# Patient Record
Sex: Female | Born: 1945
Health system: Southern US, Community
[De-identification: ages and names within clinical notes are randomized; demographics above are authoritative.]

## PROBLEM LIST (undated history)

## (undated) DIAGNOSIS — R002 Palpitations: Secondary | ICD-10-CM

## (undated) DIAGNOSIS — M8580 Other specified disorders of bone density and structure, unspecified site: Secondary | ICD-10-CM

## (undated) DIAGNOSIS — E119 Type 2 diabetes mellitus without complications: Secondary | ICD-10-CM

## (undated) DIAGNOSIS — Z8489 Family history of other specified conditions: Secondary | ICD-10-CM

## (undated) DIAGNOSIS — Z973 Presence of spectacles and contact lenses: Secondary | ICD-10-CM

## (undated) DIAGNOSIS — N8110 Cystocele, unspecified: Secondary | ICD-10-CM

## (undated) DIAGNOSIS — E785 Hyperlipidemia, unspecified: Secondary | ICD-10-CM

## (undated) DIAGNOSIS — I10 Essential (primary) hypertension: Secondary | ICD-10-CM

## (undated) DIAGNOSIS — I493 Ventricular premature depolarization: Secondary | ICD-10-CM

## (undated) HISTORY — DX: Palpitations: R00.2

## (undated) HISTORY — DX: Cystocele, unspecified: N81.10

## (undated) HISTORY — DX: Other specified disorders of bone density and structure, unspecified site: M85.80

## (undated) HISTORY — DX: Type 2 diabetes mellitus without complications: E11.9

## (undated) HISTORY — DX: Ventricular premature depolarization: I49.3

## (undated) HISTORY — DX: Essential (primary) hypertension: I10

## (undated) HISTORY — DX: Hyperlipidemia, unspecified: E78.5

---

## 1975-11-18 HISTORY — PX: TOTAL VAGINAL HYSTERECTOMY: SHX2548

## 1987-11-18 HISTORY — PX: BREAST EXCISIONAL BIOPSY: SUR124

## 1998-11-17 HISTORY — PX: LUMBAR SPINE SURGERY: SHX701

## 1999-06-11 ENCOUNTER — Other Ambulatory Visit: Admission: RE | Admit: 1999-06-11 | Discharge: 1999-06-11 | Payer: Self-pay | Admitting: Obstetrics and Gynecology

## 2000-01-17 ENCOUNTER — Encounter: Admission: RE | Admit: 2000-01-17 | Discharge: 2000-01-17 | Payer: Self-pay | Admitting: Obstetrics and Gynecology

## 2000-01-17 ENCOUNTER — Encounter: Payer: Self-pay | Admitting: Obstetrics and Gynecology

## 2000-01-30 ENCOUNTER — Emergency Department (HOSPITAL_COMMUNITY): Admission: EM | Admit: 2000-01-30 | Discharge: 2000-01-30 | Payer: Self-pay | Admitting: Emergency Medicine

## 2000-01-30 ENCOUNTER — Encounter: Payer: Self-pay | Admitting: Emergency Medicine

## 2000-06-11 ENCOUNTER — Other Ambulatory Visit: Admission: RE | Admit: 2000-06-11 | Discharge: 2000-06-11 | Payer: Self-pay | Admitting: Obstetrics and Gynecology

## 2001-05-26 ENCOUNTER — Encounter: Admission: RE | Admit: 2001-05-26 | Discharge: 2001-05-26 | Payer: Self-pay | Admitting: Obstetrics and Gynecology

## 2001-05-26 ENCOUNTER — Encounter: Payer: Self-pay | Admitting: Obstetrics and Gynecology

## 2001-06-04 ENCOUNTER — Other Ambulatory Visit: Admission: RE | Admit: 2001-06-04 | Discharge: 2001-06-04 | Payer: Self-pay | Admitting: Obstetrics and Gynecology

## 2001-07-27 ENCOUNTER — Ambulatory Visit (HOSPITAL_COMMUNITY): Admission: RE | Admit: 2001-07-27 | Discharge: 2001-07-27 | Payer: Self-pay | Admitting: Gastroenterology

## 2002-06-01 ENCOUNTER — Encounter: Payer: Self-pay | Admitting: Obstetrics and Gynecology

## 2002-06-01 ENCOUNTER — Encounter: Admission: RE | Admit: 2002-06-01 | Discharge: 2002-06-01 | Payer: Self-pay | Admitting: Obstetrics and Gynecology

## 2002-06-07 ENCOUNTER — Encounter: Admission: RE | Admit: 2002-06-07 | Discharge: 2002-06-07 | Payer: Self-pay | Admitting: Obstetrics and Gynecology

## 2002-06-07 ENCOUNTER — Encounter: Payer: Self-pay | Admitting: Obstetrics and Gynecology

## 2002-07-13 ENCOUNTER — Encounter: Admission: RE | Admit: 2002-07-13 | Discharge: 2002-08-30 | Payer: Self-pay | Admitting: Emergency Medicine

## 2003-07-04 ENCOUNTER — Encounter: Admission: RE | Admit: 2003-07-04 | Discharge: 2003-07-04 | Payer: Self-pay | Admitting: Obstetrics and Gynecology

## 2003-07-04 ENCOUNTER — Encounter: Payer: Self-pay | Admitting: Obstetrics and Gynecology

## 2003-07-07 ENCOUNTER — Encounter: Payer: Self-pay | Admitting: Obstetrics and Gynecology

## 2003-07-07 ENCOUNTER — Encounter: Admission: RE | Admit: 2003-07-07 | Discharge: 2003-07-07 | Payer: Self-pay | Admitting: Obstetrics and Gynecology

## 2004-07-31 ENCOUNTER — Encounter: Admission: RE | Admit: 2004-07-31 | Discharge: 2004-07-31 | Payer: Self-pay | Admitting: Emergency Medicine

## 2005-10-03 ENCOUNTER — Encounter: Admission: RE | Admit: 2005-10-03 | Discharge: 2005-10-03 | Payer: Self-pay | Admitting: Emergency Medicine

## 2006-12-24 ENCOUNTER — Encounter: Admission: RE | Admit: 2006-12-24 | Discharge: 2006-12-24 | Payer: Self-pay | Admitting: Emergency Medicine

## 2006-12-24 IMAGING — MG MM SCREEN MAMMOGRAM BILATERAL
6 series · 6 of 6 positions shown · non-contrast
Comparison: none

DG SCREEN MAMMOGRAM BILATERAL
Bilateral CC and MLO view(s) were taken.

DIGITAL SCREENING MAMMOGRAM WITH CAD:
There is a fibroglandular pattern.  No masses or malignant type calcifications are identified.  
Compared with prior studies.

[R CC (1 of 2)]
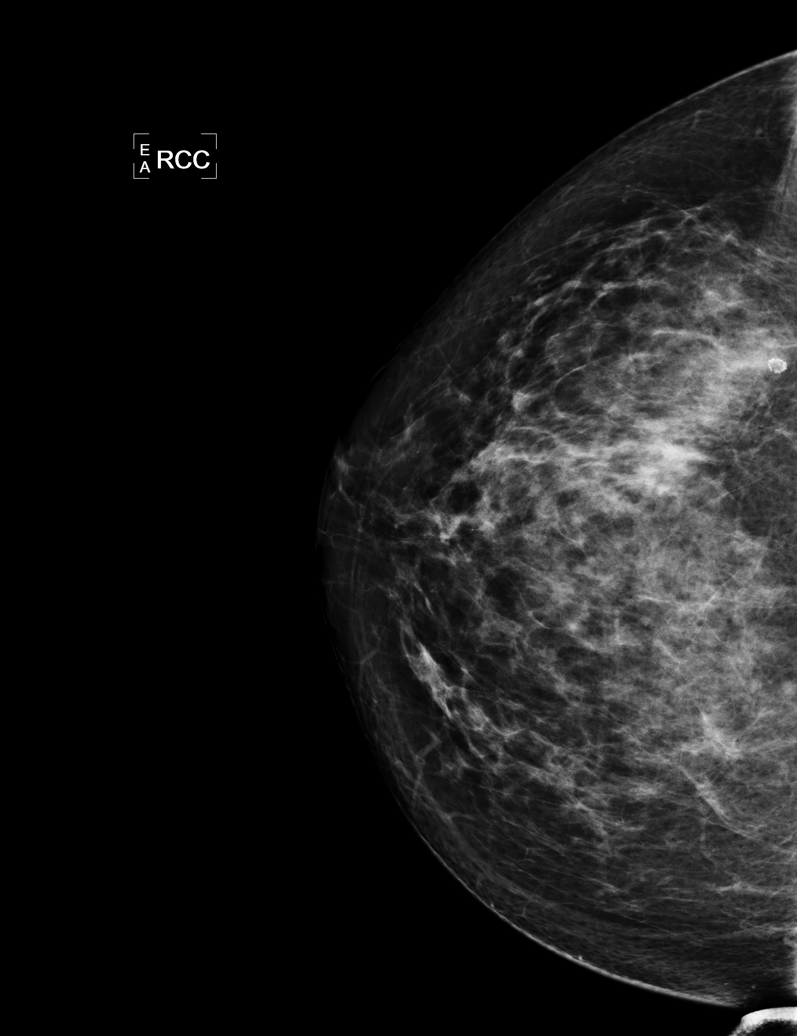

[L CC]
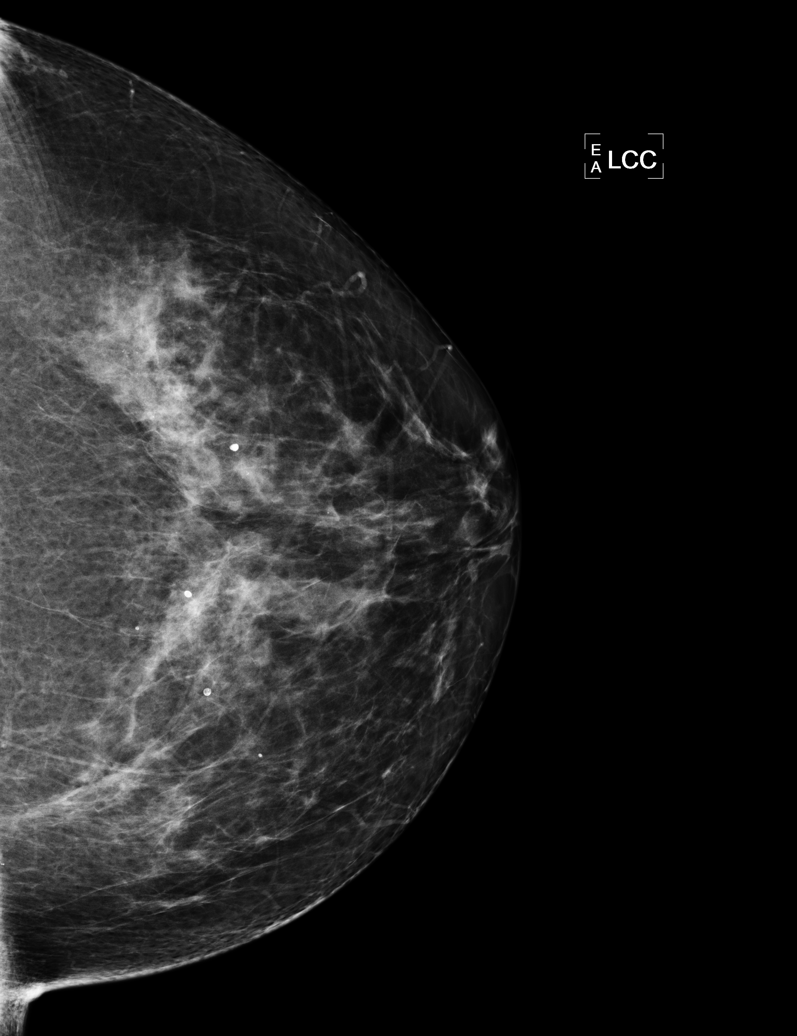

[L MLO]
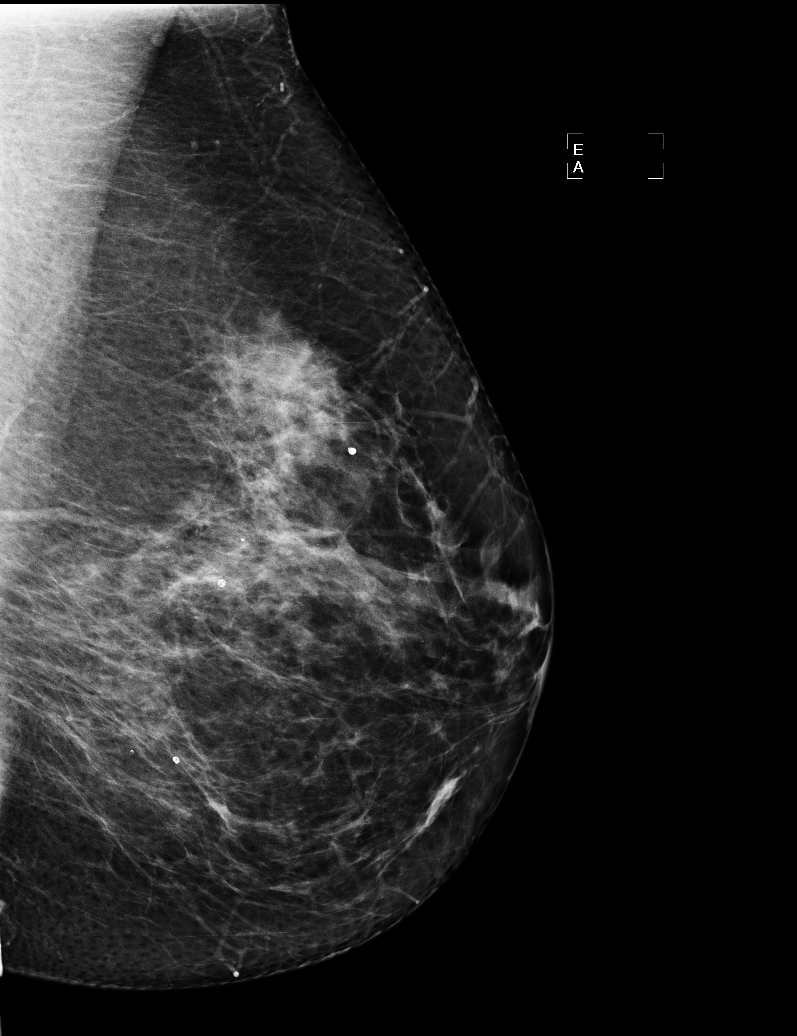

[R MLO (1 of 2)]
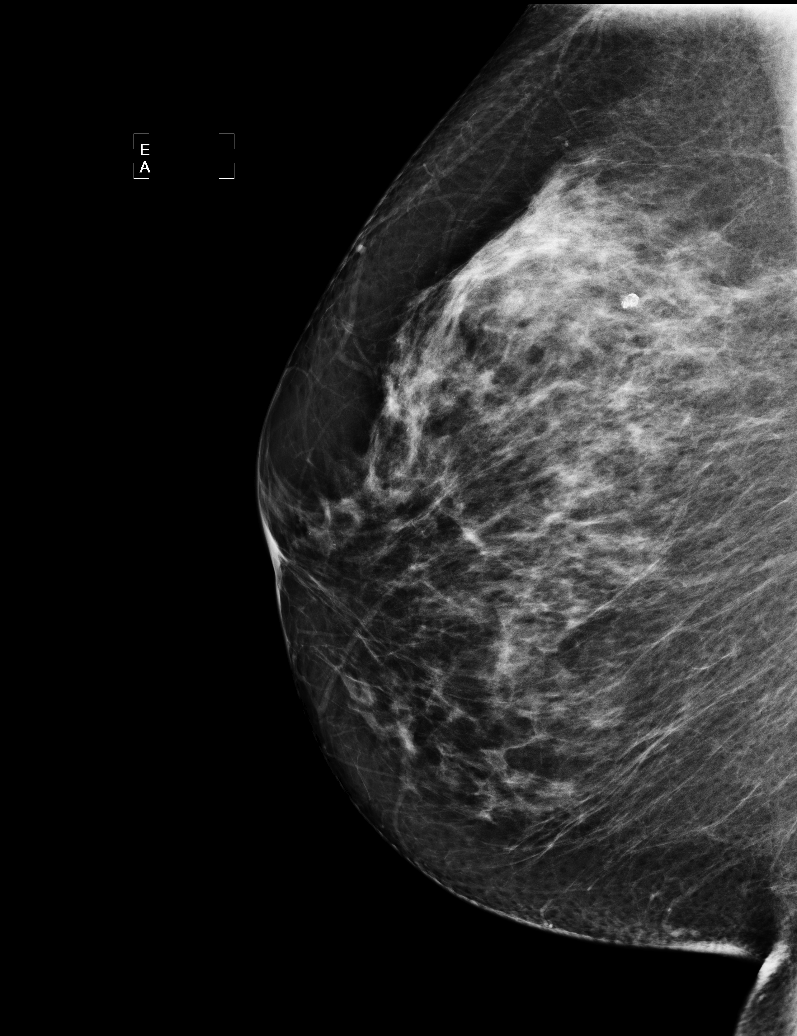

[R MLO (2 of 2)]
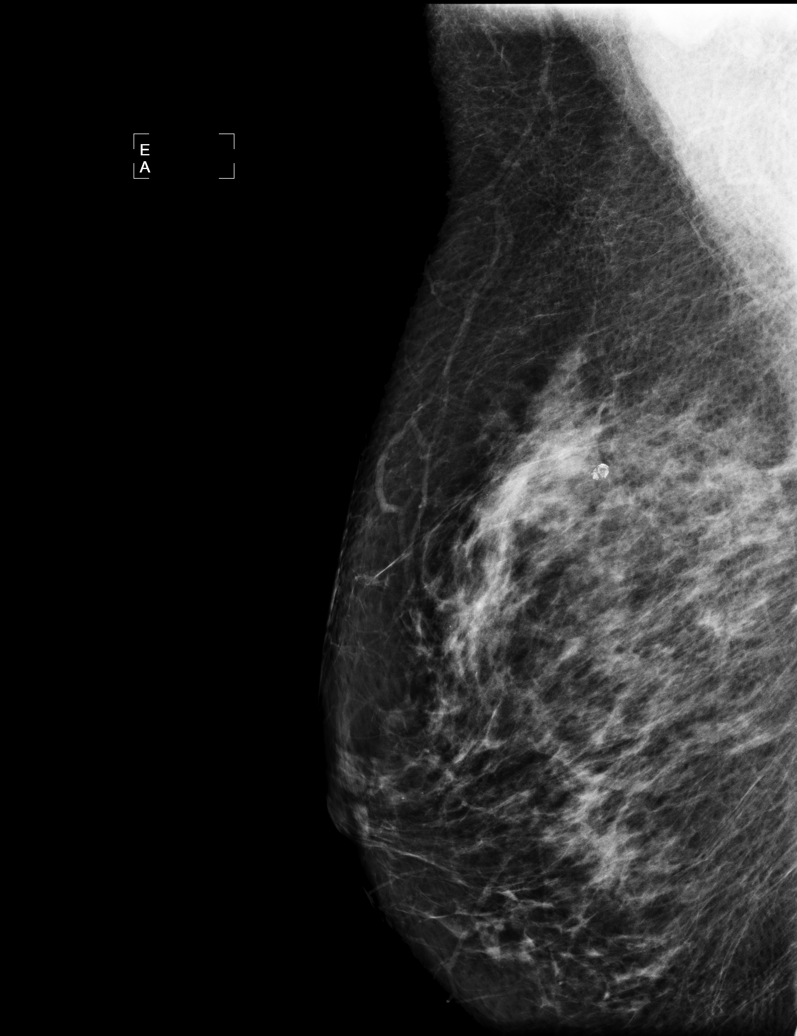

[R CC (2 of 2)]
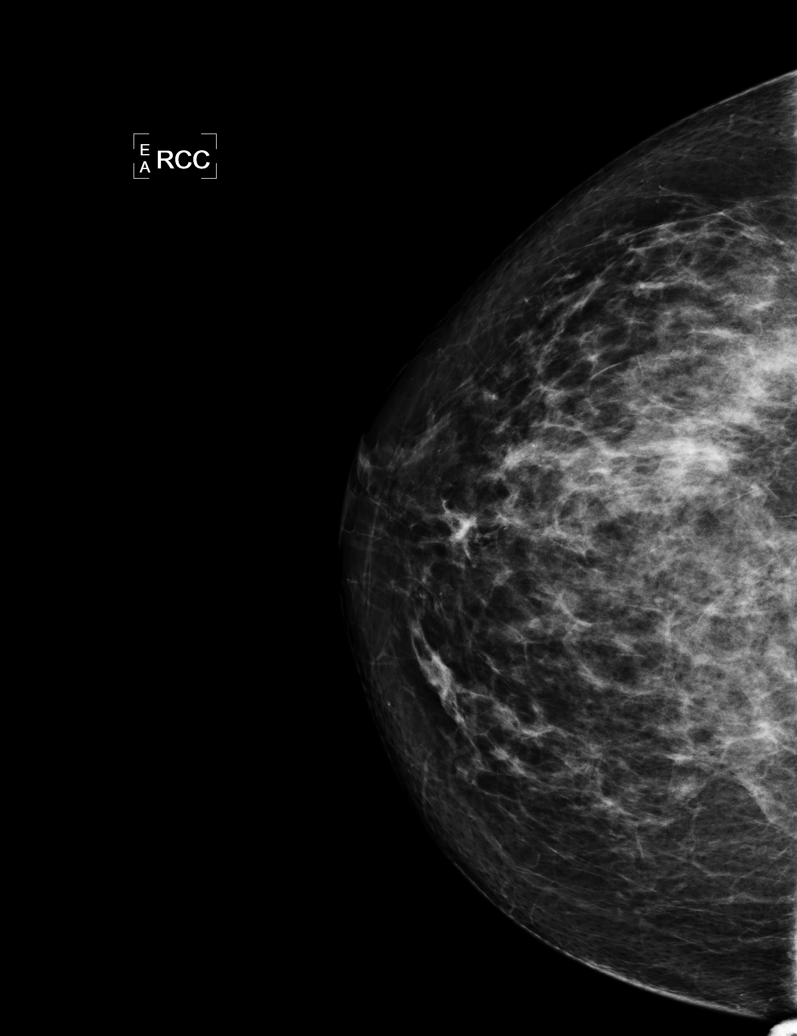

[6 of 6 positions shown; findings below may reference images not displayed]

IMPRESSION: No specific mammographic evidence of malignancy.  Next screening mammogram is recommended in one 
year.

ASSESSMENT: Negative - BI-RADS 1

Screening mammogram in 1 year.
ANALYZED BY COMPUTER AIDED DETECTION. , THIS PROCEDURE WAS A DIGITAL MAMMOGRAM.

## 2008-02-03 ENCOUNTER — Encounter: Admission: RE | Admit: 2008-02-03 | Discharge: 2008-02-03 | Payer: Self-pay | Admitting: Emergency Medicine

## 2009-02-22 ENCOUNTER — Encounter: Admission: RE | Admit: 2009-02-22 | Discharge: 2009-02-22 | Payer: Self-pay | Admitting: Emergency Medicine

## 2010-04-18 ENCOUNTER — Encounter: Admission: RE | Admit: 2010-04-18 | Discharge: 2010-04-18 | Payer: Self-pay | Admitting: *Deleted

## 2010-09-09 ENCOUNTER — Encounter: Admission: RE | Admit: 2010-09-09 | Discharge: 2010-09-09 | Payer: Self-pay | Admitting: Family Medicine

## 2010-09-09 IMAGING — CR DG SKULL COMPLETE 4+V
5 series · 5 of 5 positions shown · non-contrast
Comparison: None.

CLINICAL DATA: 64-year-old female with pain to palpation in the
superior posterior occipital bone.

SKULL - COMPLETE 4 + VIEW

[[person_name]]
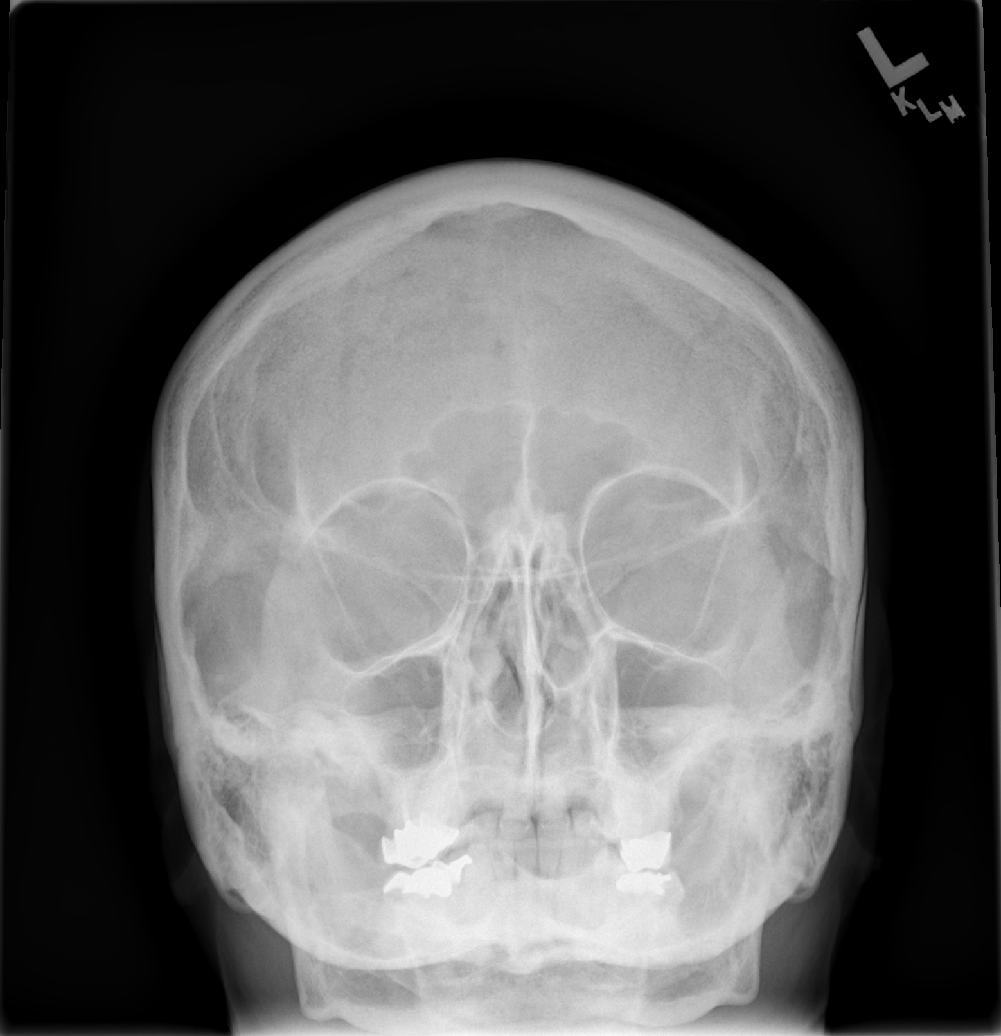

[w skull a.p./p.a.]
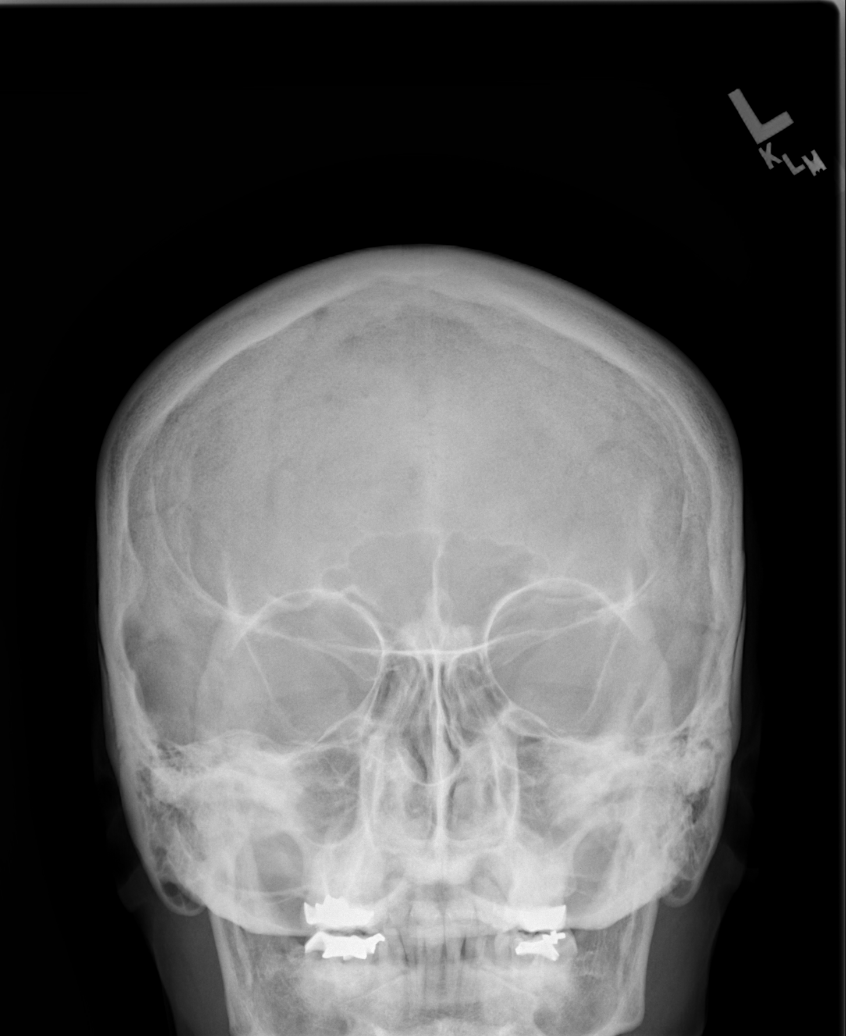

[w skull lat (1 of 2)]
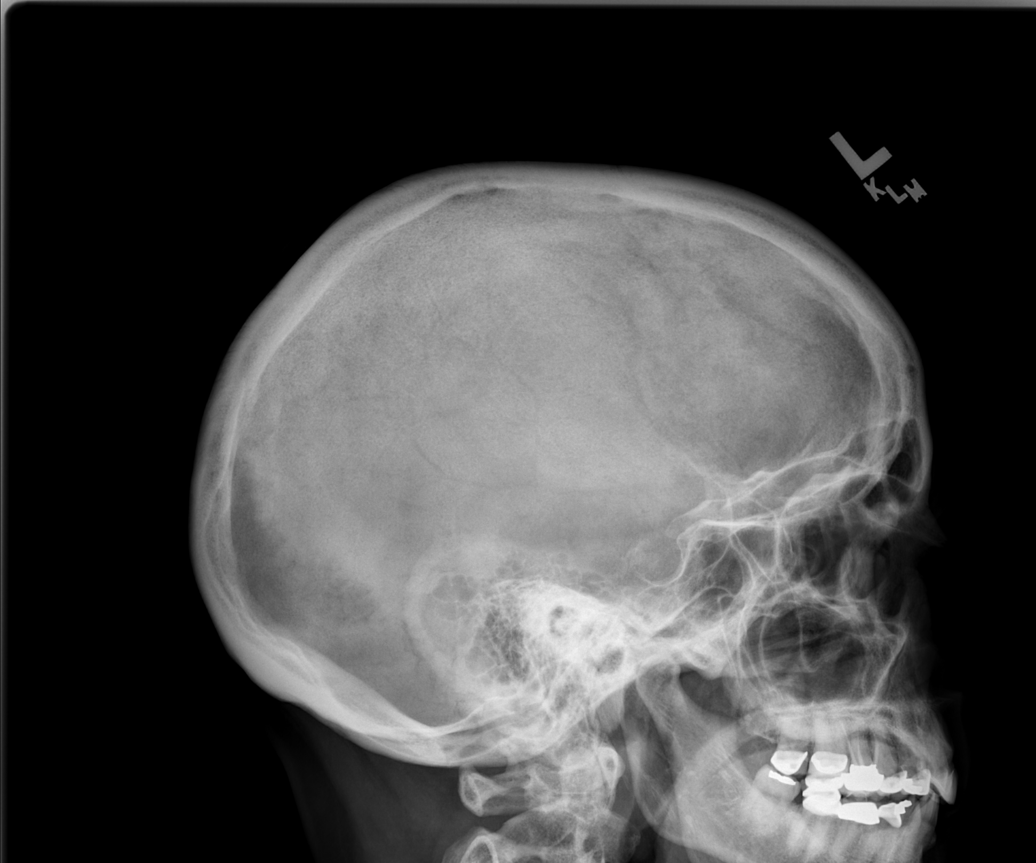

[w skull lat (2 of 2)]
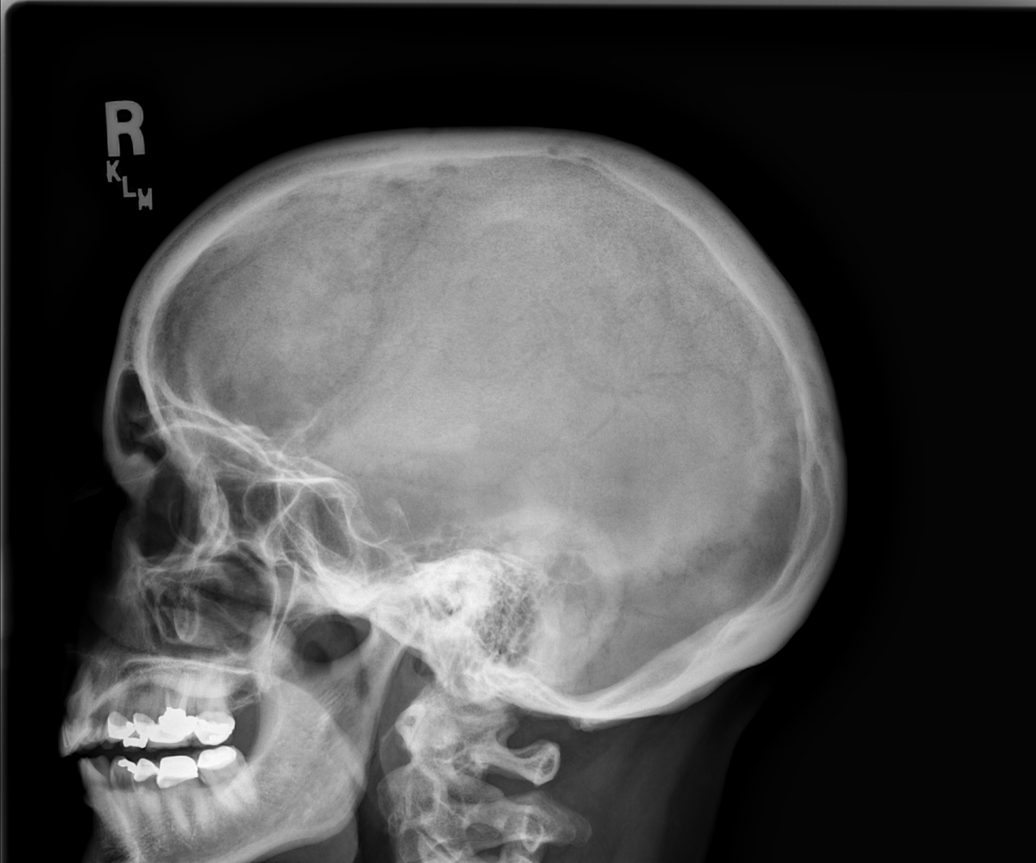

[w townes]
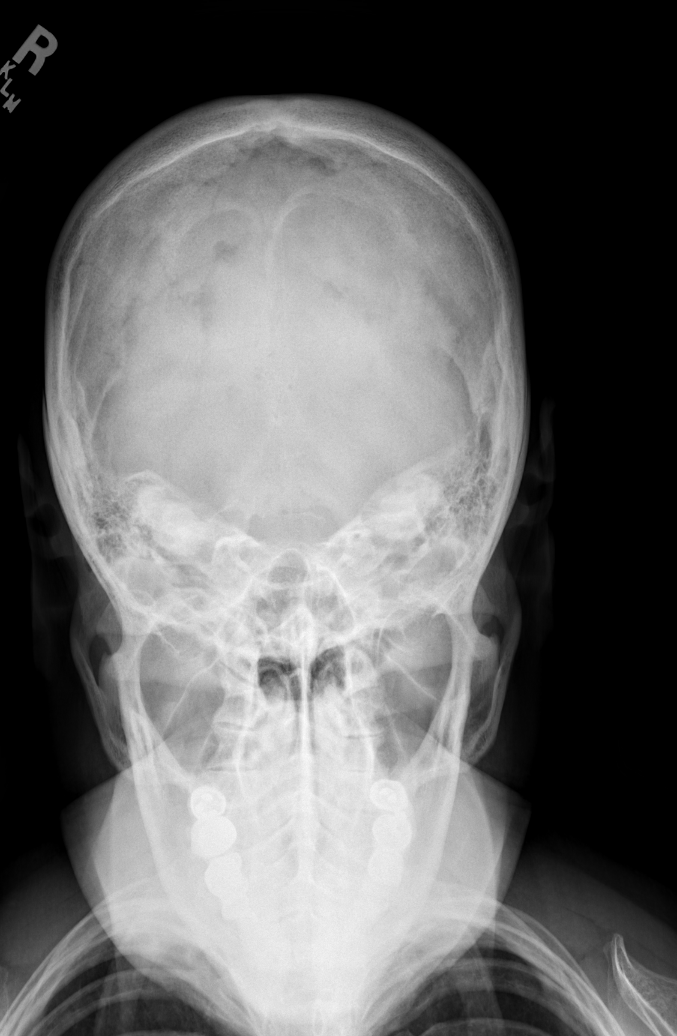

[5 of 5 positions shown; findings below may reference images not displayed]

FINDINGS: Bone mineralization is within normal limits.  Outer table
of the skull appears within normal limits including in the
occipital region.  Scalp soft tissues are over penetrated and not
evaluated.  Visualized paranasal sinuses and mastoids appear
normally pneumatized.  No focal osseous lesion.
IMPRESSION: No focal osseous lesion identified in the skull to explain the
symptoms.  The scalp soft tissues are not evaluated with this
technique.

## 2010-11-14 ENCOUNTER — Other Ambulatory Visit
Admission: RE | Admit: 2010-11-14 | Discharge: 2010-11-14 | Payer: Self-pay | Source: Home / Self Care | Admitting: Gynecology

## 2010-11-14 ENCOUNTER — Ambulatory Visit: Payer: Self-pay | Admitting: Gynecology

## 2010-11-28 ENCOUNTER — Ambulatory Visit
Admission: RE | Admit: 2010-11-28 | Discharge: 2010-11-28 | Payer: Self-pay | Source: Home / Self Care | Attending: Gynecology | Admitting: Gynecology

## 2011-02-04 HISTORY — PX: ANAL SPHINCTEROPLASTY: SUR1305

## 2011-05-16 ENCOUNTER — Other Ambulatory Visit: Payer: Self-pay | Admitting: Gynecology

## 2011-05-16 DIAGNOSIS — Z1231 Encounter for screening mammogram for malignant neoplasm of breast: Secondary | ICD-10-CM

## 2011-05-23 ENCOUNTER — Ambulatory Visit: Payer: Self-pay

## 2011-06-20 ENCOUNTER — Ambulatory Visit
Admission: RE | Admit: 2011-06-20 | Discharge: 2011-06-20 | Disposition: A | Discharge status: Home / Self Care | Payer: 59 | Source: Ambulatory Visit | Attending: Gynecology | Admitting: Gynecology

## 2011-06-20 DIAGNOSIS — Z1231 Encounter for screening mammogram for malignant neoplasm of breast: Secondary | ICD-10-CM

## 2011-11-28 DIAGNOSIS — J019 Acute sinusitis, unspecified: Secondary | ICD-10-CM | POA: Diagnosis not present

## 2011-12-09 DIAGNOSIS — J019 Acute sinusitis, unspecified: Secondary | ICD-10-CM | POA: Diagnosis not present

## 2012-03-22 ENCOUNTER — Other Ambulatory Visit: Payer: Self-pay | Admitting: Gynecology

## 2012-03-24 ENCOUNTER — Other Ambulatory Visit: Payer: Self-pay | Admitting: Gynecology

## 2012-04-20 ENCOUNTER — Ambulatory Visit (INDEPENDENT_AMBULATORY_CARE_PROVIDER_SITE_OTHER): Payer: Medicare Other | Admitting: Gynecology

## 2012-04-20 ENCOUNTER — Encounter: Payer: Self-pay | Admitting: Gynecology

## 2012-04-20 VITALS — BP 128/76 | Ht 62.5 in | Wt 142.0 lb

## 2012-04-20 DIAGNOSIS — N952 Postmenopausal atrophic vaginitis: Secondary | ICD-10-CM | POA: Diagnosis not present

## 2012-04-20 DIAGNOSIS — M8580 Other specified disorders of bone density and structure, unspecified site: Secondary | ICD-10-CM

## 2012-04-20 DIAGNOSIS — M899 Disorder of bone, unspecified: Secondary | ICD-10-CM

## 2012-04-20 DIAGNOSIS — Z833 Family history of diabetes mellitus: Secondary | ICD-10-CM | POA: Diagnosis not present

## 2012-04-20 DIAGNOSIS — R159 Full incontinence of feces: Secondary | ICD-10-CM | POA: Insufficient documentation

## 2012-04-20 MED ORDER — ESTROGENS, CONJUGATED 0.625 MG/GM VA CREA: TOPICAL_CREAM | VAGINAL | Status: DC

## 2012-04-20 NOTE — Progress Notes (Signed)
Monica Ochoa [birth year] [id number]   History:    66 y.o. with complaint of fecal incontinence and vaginal atrophy. Review of patient's records indicated that several years ago she had a TVH along with an A&P repair. Last year (2012 patient underwent at [facility] and anal sphincteroplasty with perineorrhaphy secondary to fecal incontinence and external anal sphincter defect. Patient states her symptoms have improved but some foods makes it difficult for her to maintain rectal continence. Review of her records indicated that she had a normal colonoscopy in 2012 as well as a normal mammogram. She frequently does her self breast examination. Her last bone density study was in June of 2011 with her lowest T score at the AP spine and -1.8 (osteopenia /decreased bone mineralization).  Past medical history,surgical history, family history and social history were all reviewed and documented in the EPIC chart.  Gynecologic History No LMP recorded. Patient has had a hysterectomy. Contraception: none Last Pap: 2011 . Results were: normal Last mammogram: 2012. Results were: normal  Obstetric History OB History    Grav Para Term Preterm Abortions TAB SAB Ect Mult Living   3 2 2  1  1   2      # Outc Date GA Lbr Len/2nd Wgt Sex Del Anes PTL Lv   1 TRM     M SVD  No Yes   2 TRM     F SVD  No Yes   3 SAB                ROS: A ROS was performed and pertinent positives and negatives are included in the history.  GENERAL: No fevers or chills. HEENT: No change in vision, no earache, sore throat or sinus congestion. NECK: No pain or stiffness. CARDIOVASCULAR: No chest pain or pressure. No palpitations. PULMONARY: No shortness of breath, cough or wheeze. GASTROINTESTINAL: No abdominal pain, nausea, vomiting or diarrhea, melena or bright red blood per rectum. GENITOURINARY: No urinary frequency, urgency, hesitancy or dysuria. MUSCULOSKELETAL: No joint or muscle pain, no back pain, no recent trauma.  DERMATOLOGIC: No rash, no itching, no lesions. ENDOCRINE: No polyuria, polydipsia, no heat or cold intolerance. No recent change in weight. HEMATOLOGICAL: No anemia or easy bruising or bleeding. NEUROLOGIC: No headache, seizures, numbness, tingling or weakness. PSYCHIATRIC: No depression, no loss of interest in normal activity or change in sleep pattern.     Exam: chaperone present  BP 128/76  Ht 5' 2.5" (1.588 m)  Wt 142 lb (64.411 kg)  BMI 25.56 kg/m2  Body mass index is 25.56 kg/(m^2).  General appearance : Well developed well nourished female. No acute distress HEENT: Neck supple, trachea midline, no carotid bruits, no thyroidmegaly Lungs: Clear to auscultation, no rhonchi or wheezes, or rib retractions  Heart: Regular rate and rhythm, no murmurs or gallops Breast:Examined in sitting and supine position were symmetrical in appearance, no palpable masses or tenderness,  no skin retraction, no nipple inversion, no nipple discharge, no skin discoloration, no axillary or supraclavicular lymphadenopathy Abdomen: no palpable masses or tenderness, no rebound or guarding Extremities: no edema or skin discoloration or tenderness  Pelvic:  Bartholin, Urethra, Skene Glands: Within normal limits             Vagina: No gross lesions or discharge, first degree cystocele  Cervix: Absent Uterus absent  Adnexa  Without masses or tenderness  Anus and perineum  normal   Rectovaginal : Poor anal wink. Weak anal sphincter  Hemoccult not done     Assessment/Plan:  66 y.o. female with fecal incontinence although to a lesser degree and then prior to her annual sphincteroplasty with perineorrhaphy in March 2012. She will be referred for physical therapy before reconsideration of additional surgery if indicated. Patient will schedule her mammogram and bone density study in the next month and will followup with her primary for blood work. She was given a prescription refill for Premarin vaginal  cream to apply twice a week for vaginal atrophy. We discussed importance of calcium and vitamin D and regular exercise for osteoporosis prevention. New Pap smear screening guidelines discussed. Patient has never had abnormal Pap smears in the past and we'll no longer needs Pap smears.   Ok Edwards MD, 10:47 AM 04/20/2012

## 2012-04-22 ENCOUNTER — Telehealth: Payer: Self-pay | Admitting: *Deleted

## 2012-04-22 NOTE — Telephone Encounter (Signed)
Message copied by Libby Maw on Thu Apr 22, 2012  2:29 PM ------      Message from: Ok Edwards      Created: Tue Apr 20, 2012 10:56 AM       Ary Lavine, please schedule appointment for this patient at [facility] with Rosana Fret physical therapist for patient's fecal incontinence. See encountered note from today. Thank you

## 2012-04-22 NOTE — Telephone Encounter (Signed)
Lm for Melanie to call back about setting up New Patient appt.

## 2012-04-27 NOTE — Telephone Encounter (Signed)
Lm for patient to call.  appt set with Monica Ochoa on 05/25/12 @ 3pm. Records faxed.

## 2012-04-28 NOTE — Telephone Encounter (Signed)
Patient informed appt information. 

## 2012-05-17 ENCOUNTER — Telehealth: Payer: Self-pay

## 2012-05-17 NOTE — Telephone Encounter (Signed)
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 REQUESTING A LETTER FROM YOU TO BE EXCUSED DUE TO INCONTINENCE YOU ARE CURRENTLY TREATING HER FOR. SHE WILL PICK IT UP TO ATTACH TO THE SUMMONS WHEN LETTER IS READY TO BE PICKED UP.

## 2012-05-18 NOTE — Telephone Encounter (Signed)
PT. NOTIFIED JF WROTE NOTE FOR HER AND IS READY TO BE PICKED UP.

## 2012-05-25 DIAGNOSIS — R279 Unspecified lack of coordination: Secondary | ICD-10-CM | POA: Diagnosis not present

## 2012-05-25 DIAGNOSIS — M6281 Muscle weakness (generalized): Secondary | ICD-10-CM | POA: Diagnosis not present

## 2012-05-25 DIAGNOSIS — R159 Full incontinence of feces: Secondary | ICD-10-CM | POA: Diagnosis not present

## 2012-05-25 DIAGNOSIS — R152 Fecal urgency: Secondary | ICD-10-CM | POA: Diagnosis not present

## 2012-05-26 DIAGNOSIS — R159 Full incontinence of feces: Secondary | ICD-10-CM | POA: Diagnosis not present

## 2012-05-26 DIAGNOSIS — M6281 Muscle weakness (generalized): Secondary | ICD-10-CM | POA: Diagnosis not present

## 2012-05-26 DIAGNOSIS — R279 Unspecified lack of coordination: Secondary | ICD-10-CM | POA: Diagnosis not present

## 2012-05-26 DIAGNOSIS — R152 Fecal urgency: Secondary | ICD-10-CM | POA: Diagnosis not present

## 2012-06-08 DIAGNOSIS — J3481 Nasal mucositis (ulcerative): Secondary | ICD-10-CM | POA: Diagnosis not present

## 2012-06-16 DIAGNOSIS — M6281 Muscle weakness (generalized): Secondary | ICD-10-CM | POA: Diagnosis not present

## 2012-06-16 DIAGNOSIS — R159 Full incontinence of feces: Secondary | ICD-10-CM | POA: Diagnosis not present

## 2012-06-16 DIAGNOSIS — R152 Fecal urgency: Secondary | ICD-10-CM | POA: Diagnosis not present

## 2012-06-16 DIAGNOSIS — R279 Unspecified lack of coordination: Secondary | ICD-10-CM | POA: Diagnosis not present

## 2012-07-12 DIAGNOSIS — R159 Full incontinence of feces: Secondary | ICD-10-CM | POA: Diagnosis not present

## 2012-07-12 DIAGNOSIS — M6281 Muscle weakness (generalized): Secondary | ICD-10-CM | POA: Diagnosis not present

## 2012-07-12 DIAGNOSIS — R279 Unspecified lack of coordination: Secondary | ICD-10-CM | POA: Diagnosis not present

## 2012-07-12 DIAGNOSIS — R152 Fecal urgency: Secondary | ICD-10-CM | POA: Diagnosis not present

## 2012-08-02 ENCOUNTER — Other Ambulatory Visit: Payer: Self-pay | Admitting: Gynecology

## 2012-08-02 DIAGNOSIS — Z1231 Encounter for screening mammogram for malignant neoplasm of breast: Secondary | ICD-10-CM

## 2012-08-03 ENCOUNTER — Other Ambulatory Visit: Payer: Self-pay | Admitting: *Deleted

## 2012-08-03 ENCOUNTER — Other Ambulatory Visit: Payer: Self-pay | Admitting: Gynecology

## 2012-08-03 DIAGNOSIS — M8580 Other specified disorders of bone density and structure, unspecified site: Secondary | ICD-10-CM

## 2012-08-09 DIAGNOSIS — J019 Acute sinusitis, unspecified: Secondary | ICD-10-CM | POA: Diagnosis not present

## 2012-08-09 DIAGNOSIS — J3489 Other specified disorders of nose and nasal sinuses: Secondary | ICD-10-CM | POA: Diagnosis not present

## 2012-08-10 DIAGNOSIS — J0100 Acute maxillary sinusitis, unspecified: Secondary | ICD-10-CM | POA: Diagnosis not present

## 2012-09-02 ENCOUNTER — Ambulatory Visit
Admission: RE | Admit: 2012-09-02 | Discharge: 2012-09-02 | Disposition: A | Discharge status: Home / Self Care | Payer: Medicare Other | Source: Ambulatory Visit | Attending: Gynecology | Admitting: Gynecology

## 2012-09-02 DIAGNOSIS — Z1231 Encounter for screening mammogram for malignant neoplasm of breast: Secondary | ICD-10-CM | POA: Diagnosis not present

## 2012-09-02 DIAGNOSIS — M8580 Other specified disorders of bone density and structure, unspecified site: Secondary | ICD-10-CM

## 2012-09-02 DIAGNOSIS — M949 Disorder of cartilage, unspecified: Secondary | ICD-10-CM | POA: Diagnosis not present

## 2012-09-06 DIAGNOSIS — R279 Unspecified lack of coordination: Secondary | ICD-10-CM | POA: Diagnosis not present

## 2012-09-06 DIAGNOSIS — M6281 Muscle weakness (generalized): Secondary | ICD-10-CM | POA: Diagnosis not present

## 2012-09-06 DIAGNOSIS — R152 Fecal urgency: Secondary | ICD-10-CM | POA: Diagnosis not present

## 2012-09-06 DIAGNOSIS — R159 Full incontinence of feces: Secondary | ICD-10-CM | POA: Diagnosis not present

## 2012-09-14 DIAGNOSIS — R279 Unspecified lack of coordination: Secondary | ICD-10-CM | POA: Diagnosis not present

## 2012-09-14 DIAGNOSIS — M6281 Muscle weakness (generalized): Secondary | ICD-10-CM | POA: Diagnosis not present

## 2012-09-14 DIAGNOSIS — J0100 Acute maxillary sinusitis, unspecified: Secondary | ICD-10-CM | POA: Diagnosis not present

## 2012-09-14 DIAGNOSIS — R152 Fecal urgency: Secondary | ICD-10-CM | POA: Diagnosis not present

## 2012-09-14 DIAGNOSIS — R159 Full incontinence of feces: Secondary | ICD-10-CM | POA: Diagnosis not present

## 2012-10-19 DIAGNOSIS — L919 Hypertrophic disorder of the skin, unspecified: Secondary | ICD-10-CM | POA: Diagnosis not present

## 2012-10-19 DIAGNOSIS — L819 Disorder of pigmentation, unspecified: Secondary | ICD-10-CM | POA: Diagnosis not present

## 2012-10-19 DIAGNOSIS — B351 Tinea unguium: Secondary | ICD-10-CM | POA: Diagnosis not present

## 2012-10-19 DIAGNOSIS — L909 Atrophic disorder of skin, unspecified: Secondary | ICD-10-CM | POA: Diagnosis not present

## 2012-10-19 DIAGNOSIS — L820 Inflamed seborrheic keratosis: Secondary | ICD-10-CM | POA: Diagnosis not present

## 2012-10-19 DIAGNOSIS — L821 Other seborrheic keratosis: Secondary | ICD-10-CM | POA: Diagnosis not present

## 2012-11-23 DIAGNOSIS — Z23 Encounter for immunization: Secondary | ICD-10-CM | POA: Diagnosis not present

## 2013-01-25 DIAGNOSIS — H0230 Blepharochalasis unspecified eye, unspecified eyelid: Secondary | ICD-10-CM | POA: Diagnosis not present

## 2013-05-02 DIAGNOSIS — J309 Allergic rhinitis, unspecified: Secondary | ICD-10-CM | POA: Diagnosis not present

## 2013-05-02 DIAGNOSIS — J019 Acute sinusitis, unspecified: Secondary | ICD-10-CM | POA: Diagnosis not present

## 2013-08-17 ENCOUNTER — Other Ambulatory Visit: Payer: Self-pay

## 2013-08-17 DIAGNOSIS — Z1231 Encounter for screening mammogram for malignant neoplasm of breast: Secondary | ICD-10-CM

## 2013-09-12 ENCOUNTER — Ambulatory Visit: Payer: Medicare Other

## 2013-09-14 DIAGNOSIS — Z0000 Encounter for general adult medical examination without abnormal findings: Secondary | ICD-10-CM | POA: Diagnosis not present

## 2013-09-14 DIAGNOSIS — E7800 Pure hypercholesterolemia, unspecified: Secondary | ICD-10-CM | POA: Diagnosis not present

## 2013-09-15 ENCOUNTER — Encounter: Payer: Self-pay | Admitting: Gynecology

## 2013-09-15 ENCOUNTER — Ambulatory Visit (INDEPENDENT_AMBULATORY_CARE_PROVIDER_SITE_OTHER): Payer: Medicare Other | Admitting: Gynecology

## 2013-09-15 VITALS — BP 118/70 | Ht 62.5 in | Wt 144.0 lb

## 2013-09-15 DIAGNOSIS — M899 Disorder of bone, unspecified: Secondary | ICD-10-CM

## 2013-09-15 DIAGNOSIS — R159 Full incontinence of feces: Secondary | ICD-10-CM

## 2013-09-15 DIAGNOSIS — N951 Menopausal and female climacteric states: Secondary | ICD-10-CM | POA: Diagnosis not present

## 2013-09-15 DIAGNOSIS — M8580 Other specified disorders of bone density and structure, unspecified site: Secondary | ICD-10-CM

## 2013-09-15 DIAGNOSIS — Z1159 Encounter for screening for other viral diseases: Secondary | ICD-10-CM

## 2013-09-15 DIAGNOSIS — Z79890 Hormone replacement therapy (postmenopausal): Secondary | ICD-10-CM

## 2013-09-15 LAB — HEPATITIS C ANTIBODY: HCV Ab: NEGATIVE

## 2013-09-15 MED ORDER — ESTRADIOL 0.025 MG/24HR TD PTWK: 1.0000 | MEDICATED_PATCH | TRANSDERMAL | Status: DC

## 2013-09-15 NOTE — Progress Notes (Addendum)
Monica Ochoa [birth year] [id number]   History:    67 y.o.  for annual gyn exam who was complaining of mood swings, occasional hot flashes and vaginal dryness. Patient several years ago it was a home replacement therapy and had discontinued. The last year she stopped the Premarin vaginal cream. She states she needs something for her symptoms would like something transdermal such as and a patch performed so that she does not have to remember to take something daily. Review of her right indicated that she has a history of fecal incontinence. History as follows:  First pregnancy: Fourth degree tear Second pregnancy vaginal delivery Same year as a vaginal birth patient had TVH along with A&P repair and perineoplasty in [facility] by a retired OB/GYN. In 2012 at [facility] and anal sphincteroplasty with perineorrhaphy secondary to fecal incontinence and external anal sphincter defect.  Last year patient was referred for physical therapy which she states did not help her much and even tried electrical stimulation for one month. Her physical therapy was for 3 months.  Patient had a normal colonoscopy in 2012. Her last bone density study was in 2013 with her lowest T. Score at the AP spine with a value of -1.7 there was no FRAX analysis because the patient was on ERT. A comparison to previous study there was a statistically significant improvement on her AP spine of 1.6% and a positive significant improvement on her BMD on the left femoral neck of 1%. The patient is taking her calcium and vitamin D. Last mammogram last year which was normal. The patient has not wanted to receive the shingles vaccine but her Tdap vaccine is up-to-date issue c for flu vaccine today.   Past medical history,surgical history, family history and social history were all reviewed and documented in the EPIC chart.  Gynecologic History No LMP recorded. Patient has had a hysterectomy. Contraception: status  post hysterectomy Last Pap: 2011. Results were: normal Last mammogram: 2013. Results were: normal  Obstetric History OB History  Gravida Para Term Preterm AB SAB TAB Ectopic Multiple Living  3 2 2  1 1    2     # Outcome Date GA Lbr Len/2nd Weight Sex Delivery Anes PTL Lv  3 SAB           2 TRM     F SVD  N Y  1 TRM     M SVD  N Y       ROS: A ROS was performed and pertinent positives and negatives are included in the history.  GENERAL: No fevers or chills. HEENT: No change in vision, no earache, sore throat or sinus congestion. NECK: No pain or stiffness. CARDIOVASCULAR: No chest pain or pressure. No palpitations. PULMONARY: No shortness of breath, cough or wheeze. GASTROINTESTINAL: No abdominal pain, nausea, vomiting or diarrhea, melena or bright red blood per rectum. GENITOURINARY: No urinary frequency, urgency, hesitancy or dysuria. MUSCULOSKELETAL: No joint or muscle pain, no back pain, no recent trauma. DERMATOLOGIC: No rash, no itching, no lesions. ENDOCRINE: No polyuria, polydipsia, no heat or cold intolerance. No recent change in weight. HEMATOLOGICAL: No anemia or easy bruising or bleeding. NEUROLOGIC: No headache, seizures, numbness, tingling or weakness. PSYCHIATRIC: No depression, no loss of interest in normal activity or change in sleep pattern.   fecal incontinence  Exam: chaperone present  BP 118/70  Ht 5' 2.5" (1.588 m)  Wt 144 lb (65.318 kg)  BMI 25.9 kg/m2  Body mass index is 25.9 kg/(m^2).  General appearance : Well developed well nourished female. No acute distress HEENT: Neck supple, trachea midline, no carotid bruits, no thyroidmegaly Lungs: Clear to auscultation, no rhonchi or wheezes, or rib retractions  Heart: Regular rate and rhythm, no murmurs or gallops Breast:Examined in sitting and supine position were symmetrical in appearance, no palpable masses or tenderness,  no skin retraction, no nipple inversion, no nipple discharge, no skin discoloration, no  axillary or supraclavicular lymphadenopathy Abdomen: no palpable masses or tenderness, no rebound or guarding Extremities: no edema or skin discoloration or tenderness  Pelvic:  Bartholin, Urethra, Skene Glands: Within normal limits             Vagina: No gross lesions or discharge, First-degree cystocele  Cervix:absent  Uterus Absent  Adnexa  Without masses or tenderness  Anus and perineum  normal   Rectovaginal weak  sphincter tone, and no anal wink when perineum and perirectal areas were stroke with fine Q-tip             Hemoccult PCP to provide     Assessment/Plan:  67 y.o. female for annual exam with persistence of fecal incontinence worse with some foods. Patient failed physical therapy and electrical stimulation last year. She is going to be referred to colorectal surgeon Dr. Romie Levee hearing referral for further assessment. Patient's PCP did her lab work. Patient received the flu vaccine today. She is going to be started on transdermal Climara 0.025 mg weekly patch for her menopausal vasomotor symptoms. The risks benefits and pros and cons were discussed. We discussed importance of calcium vitamin D and regular exercise for osteoporosis prevention. Pap smear was not done today in accordance with new guidelines. Hemoccult cards to be submitted on a yearly basis.  New CDC guidelines is recommending patients be tested once in her lifetime for hepatitis C antibody who were born between [birth year] through 1964. This was discussed with the patient today and has agreed to be tested today.  Note: This dictation was prepared with  Dragon/digital dictation along withSmart phrase technology. Any transcriptional errors that result from this process are unintentional.   Monica Edwards MD, 11:16 AM 09/15/2013

## 2013-09-15 NOTE — Patient Instructions (Signed)
Influenza Vaccine (Flu Vaccine, Inactivated) 2013 2014 What You Need to Know WHY GET VACCINATED?  Influenza ("flu") is a contagious disease that spreads around the [place] every winter, usually between October and May.  Flu is caused by the influenza virus, and can be spread by coughing, sneezing, and close contact.  Anyone can get flu, but the risk of getting flu is highest among children. Symptoms come on suddenly and may last several days. They can include:  Fever or chills.  Sore throat.  Muscle aches.  Fatigue.  Cough.  Headache.  Runny or stuffy nose. Flu can make some people much sicker than others. These people include young children, people 65 and older, pregnant women, and people with certain health conditions such as heart, lung or kidney disease, or a weakened immune system. Flu vaccine is especially important for these people, and anyone in close contact with them. Flu can also lead to pneumonia, and make existing medical conditions worse. It can cause diarrhea and seizures in children. Each year thousands of people in the [place] die from flu, and many more are hospitalized. Flu vaccine is the best protection we have from flu and its complications. Flu vaccine also helps prevent spreading flu from person to person. INACTIVATED FLU VACCINE There are 2 types of influenza vaccine:  You are getting an inactivated flu vaccine, which does not contain any live influenza virus. It is given by injection with a needle, and often called the "flu shot."  A different live, attenuated (weakened) influenza vaccine is sprayed into the nostrils. This vaccine is described in a separate Vaccine Information Statement. Flu vaccine is recommended every year. Children 6 months through 8 years of age should get 2 doses the first year they get vaccinated. Flu viruses are always changing. Each year's flu vaccine is made to protect from viruses that are most likely to cause disease  that year. While flu vaccine cannot prevent all cases of flu, it is our best defense against the disease. Inactivated flu vaccine protects against 3 or 4 different influenza viruses. It takes about 2 weeks for protection to develop after the vaccination, and protection lasts several months to a year. Some illnesses that are not caused by influenza virus are often mistaken for flu. Flu vaccine will not prevent these illnesses. It can only prevent influenza. A "high-dose" flu vaccine is available for people 65 years of age and older. The person giving you the vaccine can tell you more about it. Some inactivated flu vaccine contains a very small amount of a mercury-based preservative called thimerosal. Studies have shown that thimerosal in vaccines is not harmful, but flu vaccines that do not contain a preservative are available. SOME PEOPLE SHOULD NOT GET THIS VACCINE Tell the person who gives you the vaccine:  If you have any severe (life-threatening) allergies. If you ever had a life-threatening allergic reaction after a dose of flu vaccine, or have a severe allergy to any part of this vaccine, you may be advised not to get a dose. Most, but not all, types of flu vaccine contain a small amount of egg.  If you ever had Guillain Barr Syndrome (a severe paralyzing illness, also called GBS). Some people with a history of GBS should not get this vaccine. This should be discussed with your doctor.  If you are not feeling well. They might suggest waiting until you feel better. But you should come back. RISKS OF A VACCINE REACTION With a vaccine, like any medicine, there   is a chance of side effects. These are usually mild and go away on their own. Serious side effects are also possible, but are very rare. Inactivated flu vaccine does not contain live flu virus, sogetting flu from this vaccine is not possible. Brief fainting spells and related symptoms (such as jerking movements) can happen after any medical  procedure, including vaccination. Sitting or lying down for about 15 minutes after a vaccination can help prevent fainting and injuries caused by falls. Tell your doctor if you feel dizzy or lightheaded, or have vision changes or ringing in the ears. Mild problems following inactivated flu vaccine:  Soreness, redness, or swelling where the shot was given.  Hoarseness; sore, red or itchy eyes; or cough.  Fever.  Aches.  Headache.  Itching.  Fatigue. If these problems occur, they usually begin soon after the shot and last 1 or 2 days. Moderate problems following inactivated flu vaccine:  Young children who get inactivated flu vaccine and pneumococcal vaccine (PCV13) at the same time may be at increased risk for seizures caused by fever. Ask your doctor for more information. Tell your doctor if a child who is getting flu vaccine has ever had a seizure. Severe problems following inactivated flu vaccine:  A severe allergic reaction could occur after any vaccine (estimated less than 1 in a million doses).  There is a small possibility that inactivated flu vaccine could be associated with Guillan Barr Syndrome (GBS), no more than 1 or 2 cases per million people vaccinated. This is much lower than the risk of severe complications from flu, which can be prevented by flu vaccine. The safety of vaccines is always being monitored. For more information, visit: http://floyd.org/ WHAT IF THERE IS A SERIOUS REACTION? What should I look for?  Look for anything that concerns you, such as signs of a severe allergic reaction, very high fever, or behavior changes. Signs of a severe allergic reaction can include hives, swelling of the face and throat, difficulty breathing, a fast heartbeat, dizziness, and weakness. These would start a few minutes to a few hours after the vaccination. What should I do?  If you think it is a severe allergic reaction or other emergency that cannot wait, call [phone] 1 1   or get the person to the nearest hospital. Otherwise, call your doctor.  Afterward, the reaction should be reported to the Vaccine Adverse Event Reporting System (VAERS). Your doctor might file this report, or you can do it yourself through the VAERS website at www.vaers.LAgents.no, or by calling 1-(410)883-5381. VAERS is only for reporting reactions. They do not give medical advice. THE NATIONAL VACCINE INJURY COMPENSATION PROGRAM The National Vaccine Injury Compensation Program (VICP) is a federal program that was created to compensate people who may have been injured by certain vaccines. Persons who believe they may have been injured by a vaccine can learn about the program and about filing a claim by calling 1-508-718-8615 or visiting the VICP website at SpiritualWord.at HOW CAN I LEARN MORE?  Ask your doctor.  Call your local or state health department.  Contact the Centers for Disease Control and Prevention (CDC):  Call 715-096-0069 (1-800-CDC-INFO) or  Visit CDC's website at BiotechRoom.com.cy CDC Inactivated Influenza Vaccine Interim VIS (06/11/12) Document Released: 08/28/2006 Document Revised: 07/28/2012 Document Reviewed: 06/11/2012 [facility] Patient Information 2014 [place], [place].  Hormone Therapy At menopause, your body begins making less estrogen and progesterone hormones. This causes the body to stop having menstrual periods. This is because estrogen and progesterone hormones control your  periods and menstrual cycle. A lack of estrogen may cause symptoms such as:  Hot flushes (or hot flashes).  Vaginal dryness.  Dry skin.  Loss of sex drive.  Risk of bone loss (osteoporosis). When this happens, you may choose to take hormone therapy to get back the estrogen lost during menopause. When the hormone estrogen is given alone, it is usually referred to as ET (Estrogen Therapy). When the hormone progestin is combined with estrogen, it is generally called HT  (Hormone Therapy). This was formerly known as hormone replacement therapy (HRT). Your caregiver can help you make a decision on what will be best for you. The decision to use HT seems to change often as new studies are done. Many studies do not agree on the benefits of hormone replacement therapy. LIKELY BENEFITS OF HT INCLUDE PROTECTION FROM:  Hot Flushes (also called hot flashes) - A hot flush is a sudden feeling of heat that spreads over the face and body. The skin may redden like a blush. It is connected with sweats and sleep disturbance. Women going through menopause may have hot flushes a few times a month or several times per day depending on the woman.  Osteoporosis (bone loss)- Estrogen helps guard against bone loss. After menopause, a woman's bones slowly lose calcium and become weak and brittle. As a result, bones are more likely to break. The hip, wrist, and spine are affected most often. Hormone therapy can help slow bone loss after menopause. Weight bearing exercise and taking calcium with vitamin D also can help prevent bone loss. There are also medications that your caregiver can prescribe that can help prevent osteoporosis.  Vaginal Dryness - Loss of estrogen causes changes in the vagina. Its lining may become thin and dry. These changes can cause pain and bleeding during sexual intercourse. Dryness can also lead to infections. This can cause burning and itching. (Vaginal estrogen treatment can help relieve pain, itching, and dryness.)  Urinary Tract Infections are more common after menopause because of lack of estrogen. Some women also develop urinary incontinence because of low estrogen levels in the vagina and bladder.  Possible other benefits of estrogen include a positive effect on mood and short-term memory in women. RISKS AND COMPLICATIONS  Using estrogen alone without progesterone causes the lining of the uterus to grow. This increases the risk of lining of the uterus  (endometrial) cancer. Your caregiver should give another hormone called progestin if you have a uterus.  Women who take combined (estrogen and progestin) HT appear to have an increased risk of breast cancer. The risk appears to be small, but increases throughout the time that HT is taken.  Combined therapy also makes the breast tissue slightly denser which makes it harder to read mammograms (breast X-rays).  Combined, estrogen and progesterone therapy can be taken together every day, in which case there may be spotting of blood. HT therapy can be taken cyclically in which case you will have menstrual periods. Cyclically means HT is taken for a set amount of days, then not taken, then this process is repeated.  HT may increase the risk of stroke, heart attack, breast cancer and forming blood clots in your leg.  Transdermal estrogen (estrogen that is absorbed through the skin with a patch or a cream) may have more positive results with:  Cholesterol.  Blood pressure.  Blood clots. Having the following conditions may indicate you should not have HT:  Endometrial cancer.  Liver disease.  Breast cancer.  Heart  disease.  History of blood clots.  Stroke. TREATMENT   If you choose to take HT and have a uterus, usually estrogen and progestin are prescribed.  Your caregiver will help you decide the best way to take the medications.  Possible ways to take estrogen include:  Pills.  Patches.  Gels.  Sprays.  Vaginal estrogen cream, rings and tablets.  It is best to take the lowest dose possible that will help your symptoms and take them for the shortest period of time that you can.  Hormone therapy can help relieve some of the problems (symptoms) that affect women at menopause. Before making a decision about HT, talk to your caregiver about what is best for you. Be well informed and comfortable with your decisions. HOME CARE INSTRUCTIONS   Follow your caregivers advice when  taking the medications.  A Pap test is done to screen for cervical cancer.  The first Pap test should be done at age 58.  Between ages 50 and 6, Pap tests are repeated every 2 years.  Beginning at age 75, you are advised to have a Pap test every 3 years as long as your past 3 Pap tests have been normal.  Some women have medical problems that increase the chance of getting cervical cancer. Talk to your caregiver about these problems. It is especially important to talk to your caregiver if a new problem develops soon after your last Pap test. In these cases, your caregiver may recommend more frequent screening and Pap tests.  The above recommendations are the same for women who have or have not gotten the vaccine for HPV (Human Papillomavirus).  If you had a hysterectomy for a problem that was not a cancer or a condition that could lead to cancer, then you no longer need Pap tests. However, even if you no longer need a Pap test, a regular exam is a good idea to make sure no other problems are starting.   If you are between ages 71 and 90, and you have had normal Pap tests going back 10 years, you no longer need Pap tests. However, even if you no longer need a Pap test, a regular exam is a good idea to make sure no other problems are starting.   If you have had past treatment for cervical cancer or a condition that could lead to cancer, you need Pap tests and screening for cancer for at least 20 years after your treatment.  If Pap tests have been discontinued, risk factors (such as a new sexual partner) need to be re-assessed to determine if screening should be resumed.  Some women may need screenings more often if they are at high risk for cervical cancer.  Get mammograms done as per the advice of your caregiver. SEEK IMMEDIATE MEDICAL CARE IF:  You develop abnormal vaginal bleeding.  You have pain or swelling in your legs, shortness of breath, or chest pain.  You develop  dizziness or headaches.  You have lumps or changes in your breasts or armpits.  You have slurred speech.  You develop weakness or numbness of your arms or legs.  You have pain, burning, or bleeding when urinating.  You develop abdominal pain. Document Released: 08/02/2003 Document Revised: 01/26/2012 Document Reviewed: 11/20/2010 [facility] Patient Information 2014 [place], [place].

## 2013-09-16 ENCOUNTER — Telehealth: Payer: Self-pay | Admitting: *Deleted

## 2013-09-16 DIAGNOSIS — R159 Full incontinence of feces: Secondary | ICD-10-CM

## 2013-09-16 NOTE — Telephone Encounter (Signed)
appt on Nov. 10 th @ 1:00 pm pt informed.

## 2013-09-16 NOTE — Telephone Encounter (Signed)
Message copied by Aura Camps on Fri Sep 16, 2013  9:44 AM ------      Message from: Ok Edwards      Created: Thu Sep 15, 2013 11:27 AM       Victorino Dike, please make an appointment for this patient towards the end of November with Dr. Romie Levee because of patient's persistent fecal incontinence. She is trying to obtain copy of her records from her surgery at [facility]. ------

## 2013-09-26 ENCOUNTER — Ambulatory Visit (INDEPENDENT_AMBULATORY_CARE_PROVIDER_SITE_OTHER): Payer: Self-pay | Admitting: General Surgery

## 2013-10-04 ENCOUNTER — Ambulatory Visit
Admission: RE | Admit: 2013-10-04 | Discharge: 2013-10-04 | Disposition: A | Discharge status: Home / Self Care | Payer: Medicare Other | Source: Ambulatory Visit

## 2013-10-04 DIAGNOSIS — Z1231 Encounter for screening mammogram for malignant neoplasm of breast: Secondary | ICD-10-CM

## 2013-10-06 ENCOUNTER — Ambulatory Visit (INDEPENDENT_AMBULATORY_CARE_PROVIDER_SITE_OTHER): Payer: Self-pay | Admitting: General Surgery

## 2013-12-14 ENCOUNTER — Ambulatory Visit (INDEPENDENT_AMBULATORY_CARE_PROVIDER_SITE_OTHER): Payer: Medicare Other | Admitting: General Surgery

## 2013-12-14 ENCOUNTER — Encounter (INDEPENDENT_AMBULATORY_CARE_PROVIDER_SITE_OTHER): Payer: Self-pay | Admitting: General Surgery

## 2013-12-14 DIAGNOSIS — R152 Fecal urgency: Secondary | ICD-10-CM | POA: Diagnosis not present

## 2013-12-14 NOTE — Patient Instructions (Signed)
Bowel Incontinence  What is incontinence? Incontinence is the impaired ability to control gas or stool. Its severity ranges from mild difficulty with gas control to severe loss of control over liquid and formed stools. Incontinence to stool is a common problem, but often it is not discussed due to embarassment. What causes incontinence? There are many causes of incontinence. Injury during childbirth is one of the most common causes. These injuries may cause a tear in the anal muscles. The nerves supplying the anal muscles may also be injured. While some injuries may be recognized immediately following childbirth, many others may go unnoticed and not become a problem until later in life. In these situations, a prior childbirth may not be recognized as the cause of incontinence. Anal operations or traumatic injury to the tissue surrounding the anal region similarly can damage the anal muscles and hinder bowel control. Some individuals experience loss of strength in the anal muscles as they age. As a result, a minor control problem in a younger person may become more significant later in life. Diarrhea may be associated with a feeling of urgency or stool leakage due to the frequent liquid stools passing through the anal opening. If bleeding accompanies your bowel movements or you have lack of bowel control, consult your physician. These symptoms may indicate inflammation within the colon (colitis), a rectal tumor or rectal prolapse - all conditions that require prompt evaluation by a physician.    How is the cause of incontinence determined? An initial discussion of the problem with your physician will help establish the degree of control difficulty and its impact on your lifestyle. Many clues to the origin of incontinence may be found in patient histories. For example, a woman's history of past childbirths is very important. Multiple pregnancies, large weight babies, forceps deliveries, or episiotomies  may contribute to muscle or nerve injury at the time of childbirth. In some cases, medical illnesses and medications play a role in problems with control. A physical exam of the anal region should be performed. It may readily identify an obvious injury to the anal muscles. In addition, an ultrasound probe can be used within the anal area to provide a picture of the muscles and show areas in which the anal muscles have been injured. Frequently, additional studies are required to define the anal area more completely. In a test called anal manometry, a small catheter is placed into the anus to record pressure as patients relax and tighten the anal muscles. This test can demonstrate how weak or strong the muscle really is. A separate test may also be conducted to determine if the nerves that go to the anal muscles are functioning properly. What can be done to correct the problem? Treatment of incontinence may include: .     Dietary changes .     Constipating medications .     Muscle strengthening exercises .     Biofeedback  Injectable bulking agents  Surgical muscle repair  Artificial anal sphincter  Sacral nerve stimulator  After a careful history, physical examination and testing to determine the cause and severity of the problem, treatment can be addressed. Mild problems may be treated very simply with dietary changes and the use of some constipating medications. Diseases which cause inflammation in the rectum, such as colitis, may contribute to anal control problems. Treating these diseases also may eliminate or improve symptoms of incontinence. Sometimes a change in prescribed medications may help. Your physician also may recommend simple home exercises that may   strengthen the anal muscles to help in mild cases. A type of physical therapy called biofeedback can be used to help patients sense when stool is ready to be evacuated and help strengthen the muscles. Injuries to the anal muscles may be  repaired with surgery. Some individuals may benefit from a technique that delivers electrical energy to the skin and muscles surrounding the anus which results in firming and thickening of this area to help with continence. In certain individuals that have nerve damage or anal muscles that are damaged beyond repair, an artificial sphincter may be implanted. The artificial sphincter is a plastic, fluid filled doughnut that is surgically implanted around the damaged anal sphincter. This artificial sphincter keeps the anal canal closed. When an individual wants to have a bowel movement, the fluid can be pumped out of the doughnut to allow the anal canal to open. In extreme cases, patients may find that a colostomy is the best option for improving their quality of life. What is a colon and rectal surgeon? Colon and rectal surgeons are experts in the surgical and non-surgical treatment of diseases of the colon, rectum and anus. They have completed advanced surgical training in the treatment of these diseases as well as full general surgical training. Board-certified colon and rectal surgeons complete residencies in general surgery and colon and rectal surgery, and pass intensive examinations conducted by the American Board of Surgery and the American Board of Colon and Rectal Surgery. They are well-versed in the treatment of both benign and malignant diseases of the colon, rectum and anus and are able to perform routine screening examinations and surgically treat conditions if indicated to do so.  2012 American Society of Colon & Rectal Surgeons   GETTING TO GOOD BOWEL HEALTH. Irregular bowel habits such as diarrhea can lead to many problems over time.  Having one soft, formed bowel movement a day is the most important way to prevent further problems.  The anorectal canal is designed to handle stretching and feces to safely manage our ability to get rid of solid waste (feces, poop, stool) out of our body.  BUT,  diarrhea can be a burning fire to this very sensitive area of our body, causing inflamed hemorrhoids, anal fissures, increasing risk is perirectal abscesses, abdominal pain and bloating.     The goal: ONE SOFT BOWEL MOVEMENT A DAY!  To have soft, regular bowel movements:    Drink at least 8 tall glasses of water a day.     Take plenty of fiber.  Fiber is the undigested part of plant food that passes into the colon, acting s "natures broom" to encourage bowel motility and movement.  Fiber can absorb and hold large amounts of water. This results in a larger, bulkier stool, which is soft and easier to pass. Work gradually over several weeks up to 6 servings a day of fiber (25g a day even more if needed) in the form of: o Vegetables -- Root (potatoes, carrots, turnips), leafy green (lettuce, salad greens, celery, spinach), or cooked high residue (cabbage, broccoli, etc) o Fruit -- Fresh (unpeeled skin & pulp), Dried (prunes, apricots, cherries, etc ),  or stewed ( applesauce)  o Whole grain breads, pasta, etc (whole wheat)  o Bran cereals    Bulking Agents -- This type of water-retaining fiber generally is easily obtained each day by one of the following:  o Psyllium bran -- The psyllium plant is remarkable because its ground seeds can retain so much water. This product  is available as Metamucil, Konsyl, Effersyllium, Per Diem Fiber, or the less expensive generic preparation in drug and health food stores. Although labeled a laxative, it really is not a laxative.  o Methylcellulose -- This is another fiber derived from wood which also retains water. It is available as Citrucel. o Polyethylene Glycol - and "artificial" fiber commonly called Miralax or Glycolax.  It is helpful for people with gassy or bloated feelings with regular fiber o Flax Seed - a less gassy fiber than psyllium   Controlling diarrhea o Switch to liquids and simpler foods for a few days to avoid stressing your intestines  further. o Avoid dairy products (especially milk & ice cream) for a short time.  The intestines often can lose the ability to digest lactose when stressed. o Avoid foods that cause gassiness or bloating.  Typical foods include beans and other legumes, cabbage, broccoli, and dairy foods.  Every person has some sensitivity to other foods, so listen to our body and avoid those foods that trigger problems for you. o Adding fiber (Citrucel, Metamucil, psyllium, Miralax) gradually can help thicken stools by absorbing excess fluid and retrain the intestines to act more normally.  Slowly increase the dose over a few weeks.  Too much fiber too soon can backfire and cause cramping & bloating. o Probiotics (such as active yogurt, Align, etc) may help repopulate the intestines and colon with normal bacteria and calm down a sensitive digestive tract.  Most studies show it to be of mild help, though, and such products can be costly. o Medicines:   Bismuth subsalicylate (ex. Kayopectate, Pepto Bismol) every 30 minutes for up to 6 doses can help control diarrhea.  Avoid if pregnant.   Loperamide (Immodium) can slow down diarrhea.  Start with two tablets (4mg  total) first and then try one tablet every 6 hours.  Avoid if you are having fevers or severe pain.  If you are not better or start feeling worse, stop all medicines and call your doctor for advice o Call your doctor if you are getting worse or not better.  Sometimes further testing (cultures, endoscopy, X-ray studies, bloodwork, etc) may be needed to help diagnose and treat the cause of the diarrhea. o

## 2013-12-14 NOTE — Progress Notes (Signed)
Chief Complaint  Patient presents with  . Encopresis    HISTORY: Monica Ochoa is a 68 y.o. female who presents to the office with fecal incontinence.  This had been occurring for about 10 yrs. She has tried physical therapy, surgical sphincter repair by Dr Burke Keels [facility]) but this has not helped.  She underwent presurgical testing in 2012.   Loose stool makes the symptoms worse.   It is intermittent in Monica. She has urgency ~2 times a week. She denies any leakage. Her bowel habits are regular and her bowel movements are usually soft to very soft.  Her fiber intake is dietary.  Her last colonoscopy was ~3 ys ago and negative.  She has had 2 vaginal deliveries.  She had tearing with the first child.  She is incontinent to gas.  Past Medical History  Diagnosis Date  . Rectal incontinence   . Miscarriage   . Normal delivery 1974  AND 1977      Past Surgical History  Procedure Laterality Date  . Back surgery      HERNIATED DISC   . Anal sphincteroplasty  3.20.2012    WITH PERINEORRHAPHY  . Total vaginal hysterectomy      W/ AP REPAIR  . Bladder repair      TVH W/AP REPAIR  . Abdominal hysterectomy          Current Outpatient Prescriptions  Medication Sig Dispense Refill  . aspirin 81 MG tablet Take 81 mg by mouth daily.        . calcium carbonate (OS-CAL) 600 MG TABS Take 600 mg by mouth 2 (two) times daily with a meal.        . Cholecalciferol (VITAMIN D PO) Take by mouth.        . co-enzyme Q-10 50 MG capsule Take 100 mg by mouth daily.      Marland Kitchen estradiol (CLIMARA - DOSED IN MG/24 HR) 0.025 mg/24hr patch Place 1 patch (0.025 mg total) onto the skin once a week.  4 patch  12  . GLUCOSAMINE PO Take by mouth.        . Multiple Vitamin (MULTIVITAMIN PO) Take by mouth.        . Omega-3 Fatty Acids (OMEGA-3 FISH OIL PO) Take by mouth.        . Probiotic Product (PROBIOTIC COLON SUPPORT PO) Take by mouth.       No current facility-administered medications for this visit.       Allergies  Allergen Reactions  . Codeine       Family History  Problem Relation Age of Onset  . Hypertension Mother   . Cervical cancer Mother   . Uterine cancer Mother   . Heart disease Mother   . Cancer Mother     UTERINE  . Diabetes Father     History   Social History  . Marital Status: Married    Spouse Name: N/A    Number of Children: N/A  . Years of Education: N/A   Social History Main Topics  . Smoking status: Never Smoker   . Smokeless tobacco: Never Used  . Alcohol Use: No  . Drug Use: No  . Sexual Activity: Yes   Other Topics Concern  . None   Social History Narrative  . None      REVIEW OF SYSTEMS - PERTINENT POSITIVES ONLY: Review of Systems - General ROS: negative for - chills, fever or weight loss Hematological and Lymphatic ROS: negative for - bleeding  problems, blood clots or bruising Respiratory ROS: no cough, shortness of breath, or wheezing Cardiovascular ROS: no chest pain or dyspnea on exertion Gastrointestinal ROS: no abdominal pain, change in bowel habits, or black or bloody stools Genito-Urinary ROS: no dysuria, trouble voiding, or hematuria  EXAM: Filed Vitals:   12/14/13 1028  BP: 128/78  Pulse: 66  Temp: 97.1 F (36.2 C)  Resp: 18    General appearance: alert and cooperative Resp: clear to auscultation bilaterally Cardio: regular rate and rhythm GI: soft, non-tender; bowel sounds normal; no masses,  no organomegaly   Procedure: Anoscopy Surgeon: Marcello Moores Diagnosis: fecal urgency  Assistant: Alphonzo Severance After the risks and benefits were explained, verbal consent was obtained for above procedure  Anesthesia: none Findings: moderate sphincter tone, short sphincter, weak squeeze, good push, redundant mucosa    ASSESSMENT AND PLAN:  Monica Ochoa Is a 68 year old female who presents to the office with urgency and occasional incontinence to flatus. She has been seen by [facility] and a full workup was  performed in 2012. They told her that she had a shortened sphincter and recommended a sphincteroplasty. This was performed she has not had any long lasting effects from this.  On exam she does have some decreased muscle tone and a shortened sphincter. Given that she does not have any fecal incontinence and only has urgency, I have recommended that she try a fiber supplement to help bulk her stools. I gave her a list of different options to try including antidiarrheal medications. She will call the office if her symptoms worsen.   Rosario Adie, MD Colon and Rectal Surgery / [facility]      Visit Diagnoses: No diagnosis found.  Primary Care Physician: Antony Blackbird, MD

## 2013-12-22 DIAGNOSIS — L820 Inflamed seborrheic keratosis: Secondary | ICD-10-CM | POA: Diagnosis not present

## 2013-12-22 DIAGNOSIS — L819 Disorder of pigmentation, unspecified: Secondary | ICD-10-CM | POA: Diagnosis not present

## 2013-12-22 DIAGNOSIS — L821 Other seborrheic keratosis: Secondary | ICD-10-CM | POA: Diagnosis not present

## 2014-02-23 ENCOUNTER — Encounter: Payer: Self-pay | Admitting: *Deleted

## 2014-03-15 DIAGNOSIS — R7301 Impaired fasting glucose: Secondary | ICD-10-CM | POA: Diagnosis not present

## 2014-03-15 DIAGNOSIS — E7800 Pure hypercholesterolemia, unspecified: Secondary | ICD-10-CM | POA: Diagnosis not present

## 2014-03-17 DIAGNOSIS — E785 Hyperlipidemia, unspecified: Secondary | ICD-10-CM | POA: Diagnosis not present

## 2014-03-17 DIAGNOSIS — M62838 Other muscle spasm: Secondary | ICD-10-CM | POA: Diagnosis not present

## 2014-03-17 DIAGNOSIS — R7309 Other abnormal glucose: Secondary | ICD-10-CM | POA: Diagnosis not present

## 2014-05-18 DIAGNOSIS — L819 Disorder of pigmentation, unspecified: Secondary | ICD-10-CM | POA: Diagnosis not present

## 2014-05-18 DIAGNOSIS — L909 Atrophic disorder of skin, unspecified: Secondary | ICD-10-CM | POA: Diagnosis not present

## 2014-05-18 DIAGNOSIS — D239 Other benign neoplasm of skin, unspecified: Secondary | ICD-10-CM | POA: Diagnosis not present

## 2014-05-18 DIAGNOSIS — B353 Tinea pedis: Secondary | ICD-10-CM | POA: Diagnosis not present

## 2014-05-18 DIAGNOSIS — L919 Hypertrophic disorder of the skin, unspecified: Secondary | ICD-10-CM | POA: Diagnosis not present

## 2014-05-18 DIAGNOSIS — L821 Other seborrheic keratosis: Secondary | ICD-10-CM | POA: Diagnosis not present

## 2014-05-18 DIAGNOSIS — L570 Actinic keratosis: Secondary | ICD-10-CM | POA: Diagnosis not present

## 2014-05-18 DIAGNOSIS — D1801 Hemangioma of skin and subcutaneous tissue: Secondary | ICD-10-CM | POA: Diagnosis not present

## 2014-08-29 ENCOUNTER — Other Ambulatory Visit: Payer: Self-pay

## 2014-08-29 ENCOUNTER — Telehealth: Payer: Self-pay | Admitting: *Deleted

## 2014-08-29 DIAGNOSIS — Z1239 Encounter for other screening for malignant neoplasm of breast: Secondary | ICD-10-CM

## 2014-08-29 DIAGNOSIS — M8580 Other specified disorders of bone density and structure, unspecified site: Secondary | ICD-10-CM

## 2014-08-29 DIAGNOSIS — Z1231 Encounter for screening mammogram for malignant neoplasm of breast: Secondary | ICD-10-CM

## 2014-08-29 NOTE — Telephone Encounter (Signed)
Pt called requesting dexa order placed in at breast center, order placed pt will call to schedule.

## 2014-09-09 ENCOUNTER — Other Ambulatory Visit: Payer: Self-pay | Admitting: Gynecology

## 2014-09-18 ENCOUNTER — Encounter: Payer: Self-pay | Admitting: *Deleted

## 2014-10-04 DIAGNOSIS — Z23 Encounter for immunization: Secondary | ICD-10-CM | POA: Diagnosis not present

## 2014-10-05 ENCOUNTER — Ambulatory Visit
Admission: RE | Admit: 2014-10-05 | Discharge: 2014-10-05 | Disposition: A | Discharge status: Home / Self Care | Payer: Medicare Other | Source: Ambulatory Visit

## 2014-10-05 ENCOUNTER — Ambulatory Visit
Admission: RE | Admit: 2014-10-05 | Discharge: 2014-10-05 | Disposition: A | Discharge status: Home / Self Care | Payer: Medicare Other | Source: Ambulatory Visit | Attending: Gynecology | Admitting: Gynecology

## 2014-10-05 DIAGNOSIS — Z1231 Encounter for screening mammogram for malignant neoplasm of breast: Secondary | ICD-10-CM | POA: Diagnosis not present

## 2014-10-05 DIAGNOSIS — M8580 Other specified disorders of bone density and structure, unspecified site: Secondary | ICD-10-CM

## 2014-10-05 DIAGNOSIS — M8588 Other specified disorders of bone density and structure, other site: Secondary | ICD-10-CM | POA: Diagnosis not present

## 2014-10-05 DIAGNOSIS — Z780 Asymptomatic menopausal state: Secondary | ICD-10-CM | POA: Diagnosis not present

## 2014-10-05 DIAGNOSIS — M85852 Other specified disorders of bone density and structure, left thigh: Secondary | ICD-10-CM | POA: Diagnosis not present

## 2014-10-05 IMAGING — MG MM SCREENING BREAST TOMO BILATERAL
8 series · 8 of 24 positions shown · non-contrast
Comparison: Previous exam(s).

CLINICAL DATA: Screening.

EXAM:
DIGITAL SCREENING BILATERAL MAMMOGRAM WITH 3D TOMO WITH CAD

[R CC]
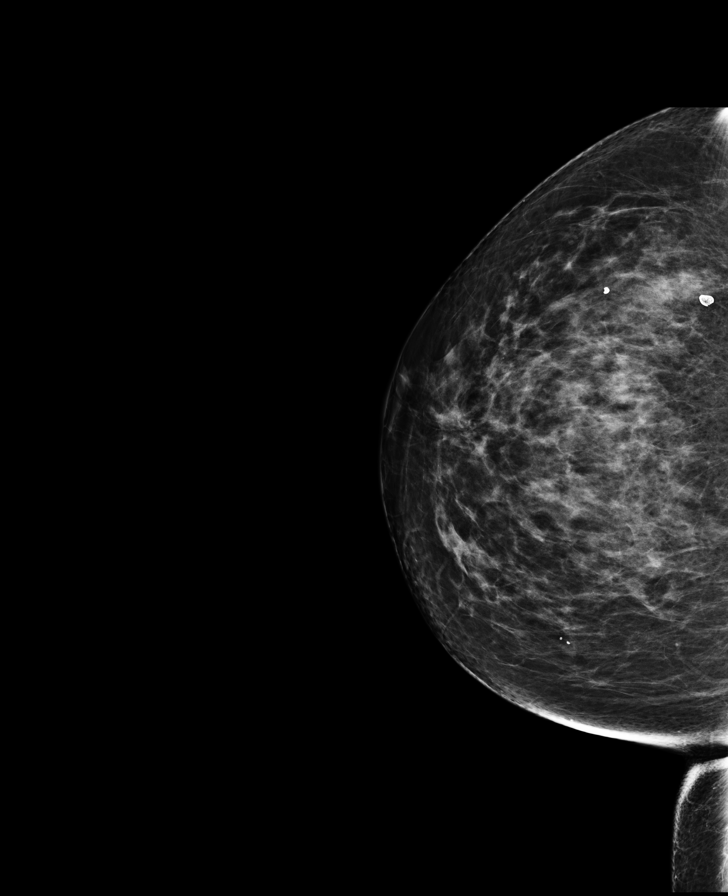

[R MLO]
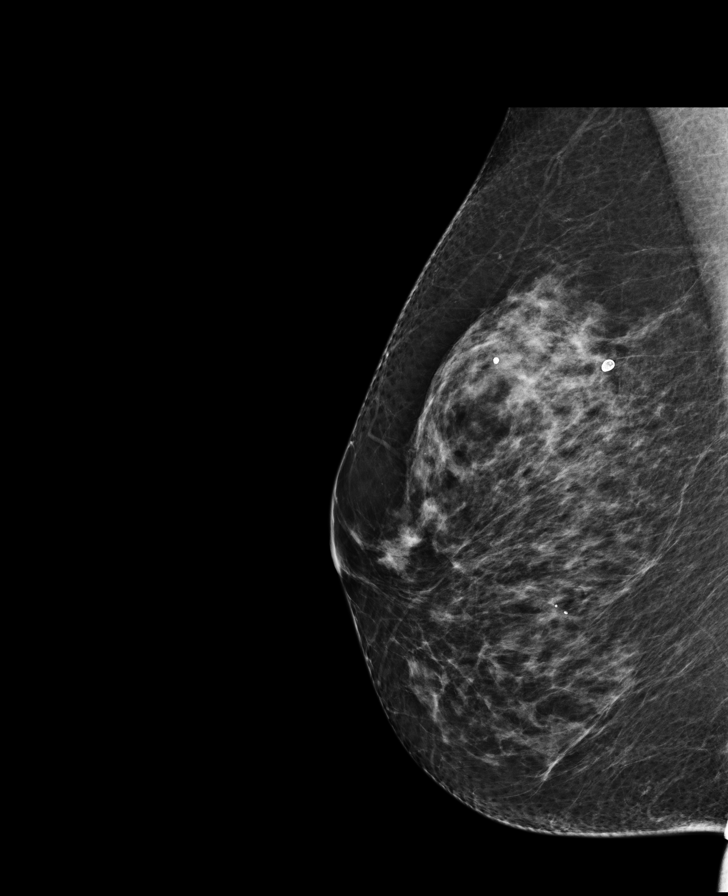

[L MLO]
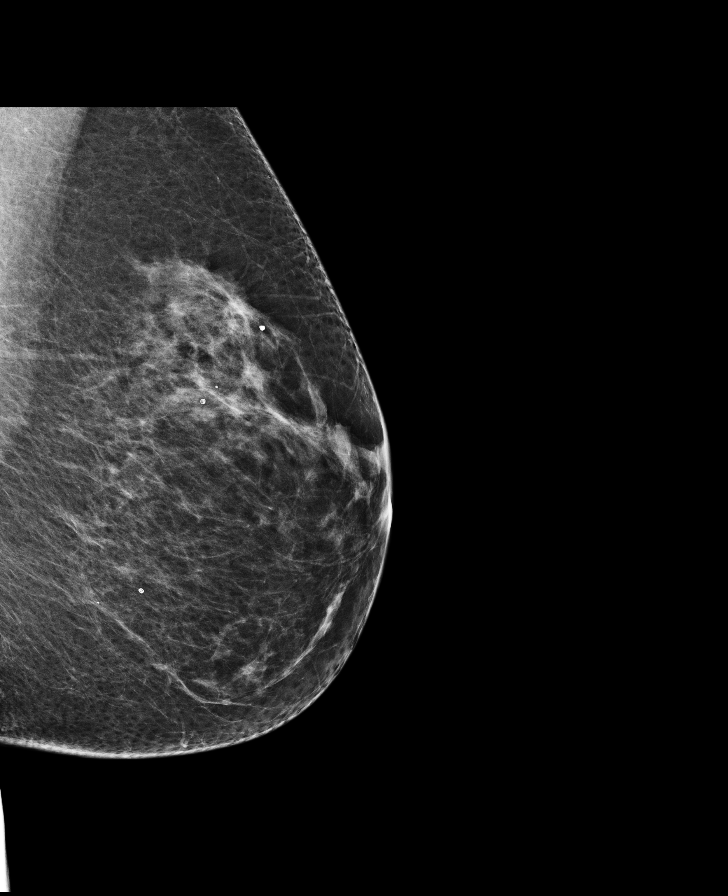

[L CC]
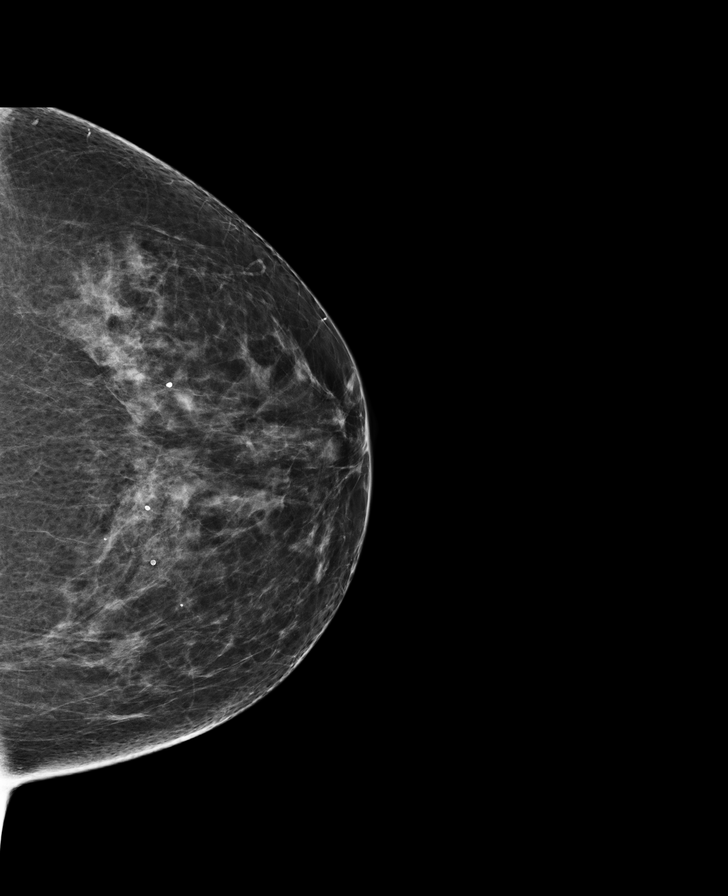

[R MLO tomo · tomo slice 43/85.0]
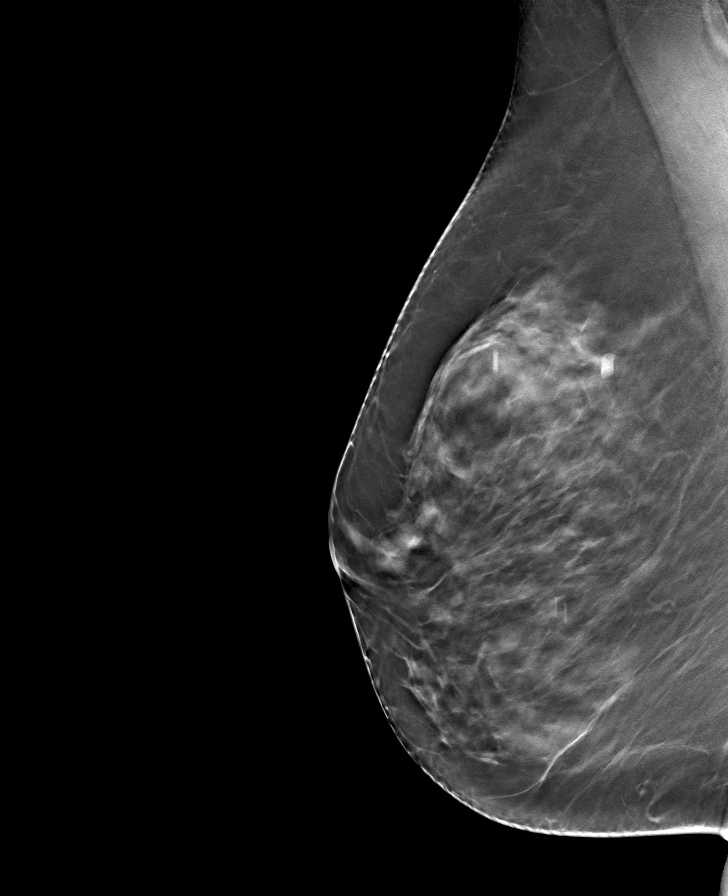

[L CC tomo · tomo slice 41/81.0]
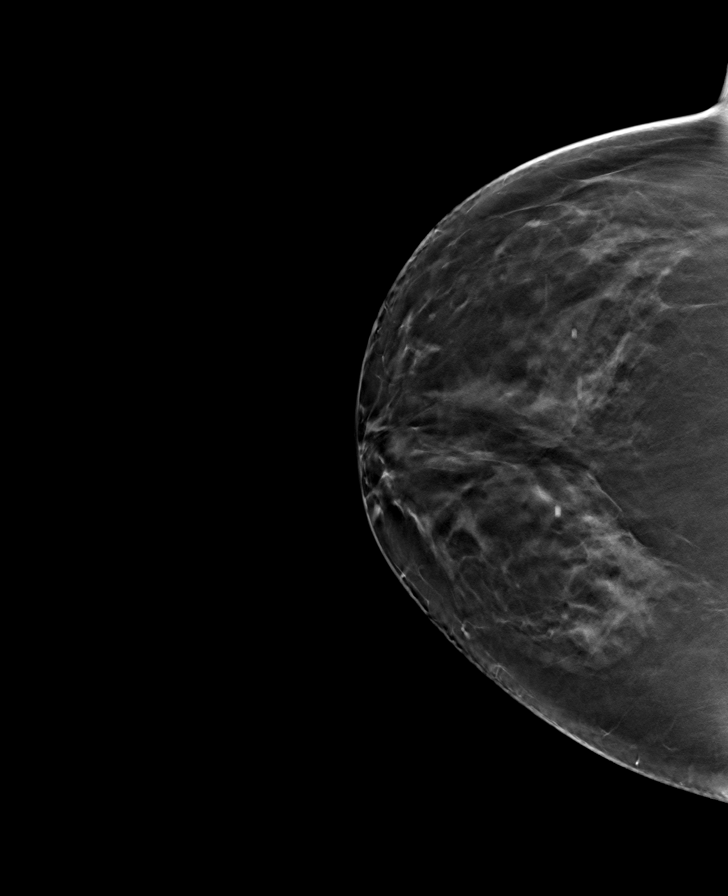

[L MLO tomo · tomo slice 39/78.0]
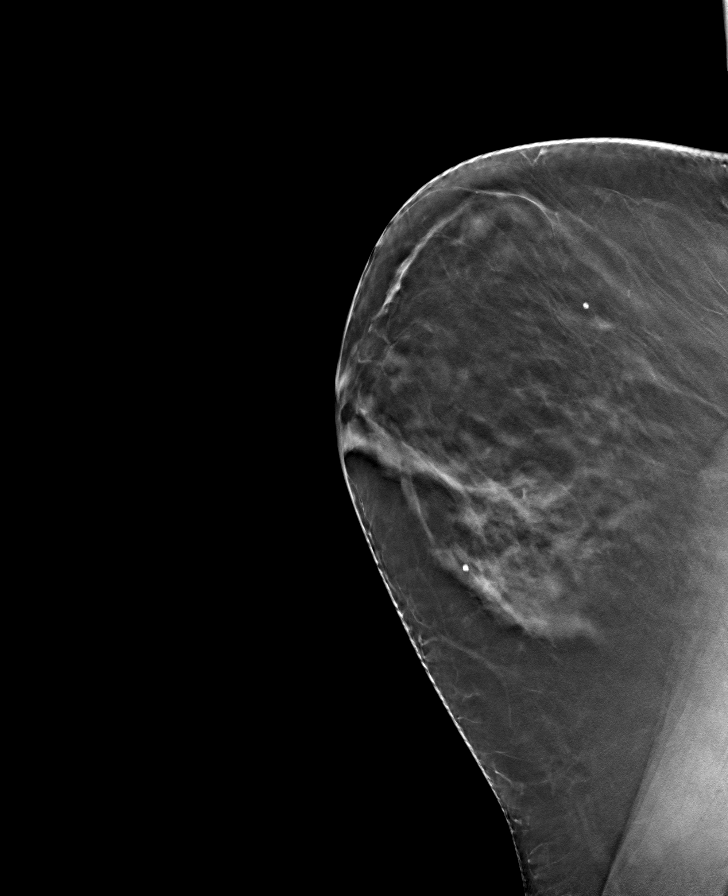

[R CC tomo · tomo slice 48/95.0]
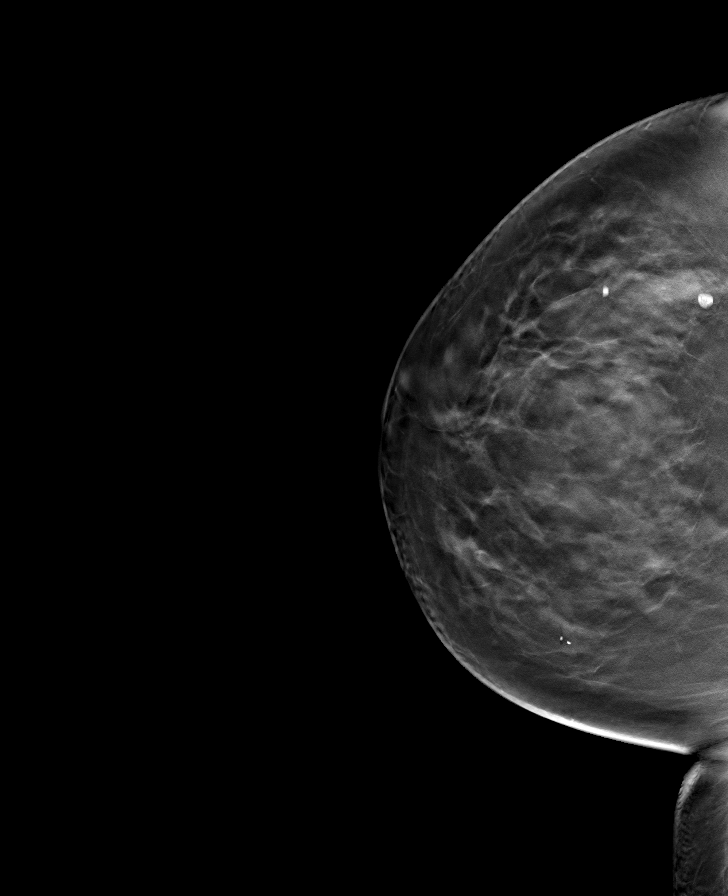

[8 of 24 positions shown; findings below may reference images not displayed]

ACR Breast Density Category b: There are scattered areas of
fibroglandular density.
FINDINGS: There are no findings suspicious for malignancy. Images were
processed with CAD.
IMPRESSION: No mammographic evidence of malignancy. A result letter of this
screening mammogram will be mailed directly to the patient.

RECOMMENDATION:
Screening mammogram in one year. (Code:[F0])

BI-RADS CATEGORY  1: Negative.

## 2014-10-14 DIAGNOSIS — Z79899 Other long term (current) drug therapy: Secondary | ICD-10-CM | POA: Diagnosis not present

## 2014-10-14 DIAGNOSIS — R079 Chest pain, unspecified: Secondary | ICD-10-CM | POA: Diagnosis not present

## 2014-10-14 DIAGNOSIS — R0602 Shortness of breath: Secondary | ICD-10-CM | POA: Diagnosis not present

## 2014-10-14 DIAGNOSIS — R0789 Other chest pain: Secondary | ICD-10-CM | POA: Diagnosis not present

## 2014-10-16 ENCOUNTER — Encounter: Payer: Medicare Other | Admitting: Gynecology

## 2014-10-17 ENCOUNTER — Encounter: Payer: Self-pay | Admitting: Gynecology

## 2014-11-01 DIAGNOSIS — J040 Acute laryngitis: Secondary | ICD-10-CM | POA: Diagnosis not present

## 2014-11-01 DIAGNOSIS — H6122 Impacted cerumen, left ear: Secondary | ICD-10-CM | POA: Diagnosis not present

## 2014-11-01 DIAGNOSIS — J322 Chronic ethmoidal sinusitis: Secondary | ICD-10-CM | POA: Diagnosis not present

## 2014-11-01 DIAGNOSIS — J320 Chronic maxillary sinusitis: Secondary | ICD-10-CM | POA: Diagnosis not present

## 2014-11-04 ENCOUNTER — Other Ambulatory Visit: Payer: Self-pay | Admitting: Gynecology

## 2014-11-15 DIAGNOSIS — J301 Allergic rhinitis due to pollen: Secondary | ICD-10-CM | POA: Diagnosis not present

## 2014-11-15 DIAGNOSIS — J322 Chronic ethmoidal sinusitis: Secondary | ICD-10-CM | POA: Diagnosis not present

## 2014-11-15 DIAGNOSIS — J302 Other seasonal allergic rhinitis: Secondary | ICD-10-CM | POA: Diagnosis not present

## 2014-11-15 DIAGNOSIS — J320 Chronic maxillary sinusitis: Secondary | ICD-10-CM | POA: Diagnosis not present

## 2014-11-21 ENCOUNTER — Encounter: Payer: Medicare Other | Admitting: Gynecology

## 2014-11-23 DIAGNOSIS — H6121 Impacted cerumen, right ear: Secondary | ICD-10-CM | POA: Diagnosis not present

## 2014-11-23 DIAGNOSIS — J321 Chronic frontal sinusitis: Secondary | ICD-10-CM | POA: Diagnosis not present

## 2014-11-23 DIAGNOSIS — J370 Chronic laryngitis: Secondary | ICD-10-CM | POA: Diagnosis not present

## 2014-11-23 DIAGNOSIS — J320 Chronic maxillary sinusitis: Secondary | ICD-10-CM | POA: Diagnosis not present

## 2014-11-23 DIAGNOSIS — J322 Chronic ethmoidal sinusitis: Secondary | ICD-10-CM | POA: Diagnosis not present

## 2014-11-27 DIAGNOSIS — J329 Chronic sinusitis, unspecified: Secondary | ICD-10-CM | POA: Diagnosis not present

## 2014-12-02 ENCOUNTER — Other Ambulatory Visit: Payer: Self-pay | Admitting: Gynecology

## 2014-12-04 DIAGNOSIS — J322 Chronic ethmoidal sinusitis: Secondary | ICD-10-CM | POA: Diagnosis not present

## 2014-12-04 DIAGNOSIS — J320 Chronic maxillary sinusitis: Secondary | ICD-10-CM | POA: Diagnosis not present

## 2014-12-28 ENCOUNTER — Ambulatory Visit (INDEPENDENT_AMBULATORY_CARE_PROVIDER_SITE_OTHER): Payer: Medicare Other | Admitting: Gynecology

## 2014-12-28 ENCOUNTER — Encounter: Payer: Self-pay | Admitting: Gynecology

## 2014-12-28 VITALS — BP 130/70 | Ht 62.5 in | Wt 142.0 lb

## 2014-12-28 DIAGNOSIS — Z01419 Encounter for gynecological examination (general) (routine) without abnormal findings: Secondary | ICD-10-CM

## 2014-12-28 MED ORDER — ESTRADIOL 0.025 MG/24HR TD PTWK: 0.0250 mg | MEDICATED_PATCH | TRANSDERMAL | Status: DC

## 2014-12-28 NOTE — Patient Instructions (Signed)
Shingles Vaccine What You Need to Know WHAT IS SHINGLES?  Shingles is a painful skin rash, often with blisters. It is also called Herpes Zoster or just Zoster.  A shingles rash usually appears on one side of the face or body and lasts from 2 to 4 weeks. Its main symptom is pain, which can be quite severe. Other symptoms of shingles can include fever, headache, chills, and upset stomach. Very rarely, a shingles infection can lead to pneumonia, hearing problems, blindness, brain inflammation (encephalitis), or death.  For about 1 person in 5, severe pain can continue even after the rash clears up. This is called post-herpetic neuralgia.  Shingles is caused by the Varicella Zoster virus. This is the same virus that causes chickenpox. Only someone who has had a case of chickenpox or rarely, has gotten chickenpox vaccine, can get shingles. The virus stays in your body. It can reappear many years later to cause a case of shingles.  You cannot catch shingles from another person with shingles. However, a person who has never had chickenpox (or chickenpox vaccine) could get chickenpox from someone with shingles. This is not very common.  Shingles is far more common in people 50 and older than in younger people. It is also more common in people whose immune systems are weakened because of a disease such as cancer or drugs such as steroids or chemotherapy.  At least 1 million people get shingles per year in the [place]. SHINGLES VACCINE  A vaccine for shingles was licensed in 2006. In clinical trials, the vaccine reduced the risk of shingles by 50%. It can also reduce the pain in people who still get shingles after being vaccinated.  A single dose of shingles vaccine is recommended for adults 60 years of age and older. SOME PEOPLE SHOULD NOT GET SHINGLES VACCINE OR SHOULD WAIT A person should not get shingles vaccine if he or she:  Has ever had a life-threatening allergic reaction to gelatin, the  antibiotic neomycin, or any other component of shingles vaccine. Tell your caregiver if you have any severe allergies.  Has a weakened immune system because of current:  AIDS or another disease that affects the immune system.  Treatment with drugs that affect the immune system, such as prolonged use of high-dose steroids.  Cancer treatment, such as radiation or chemotherapy.  Cancer affecting the bone marrow or lymphatic system, such as leukemia or lymphoma.  Is pregnant, or might be pregnant. Women should not become pregnant until at least 4 weeks after getting shingles vaccine. Someone with a minor illness, such as a cold, may be vaccinated. Anyone with a moderate or severe acute illness should usually wait until he or she recovers before getting the vaccine. This includes anyone with a temperature of 101.3 F (38 C) or higher. WHAT ARE THE RISKS FROM SHINGLES VACCINE?  A vaccine, like any medicine, could possibly cause serious problems, such as severe allergic reactions. However, the risk of a vaccine causing serious harm, or death, is extremely small.  No serious problems have been identified with shingles vaccine. Mild Problems  Redness, soreness, swelling, or itching at the site of the injection (about 1 person in 3).  Headache (about 1 person in 70). Like all vaccines, shingles vaccine is being closely monitored for unusual or severe problems. WHAT IF THERE IS A MODERATE OR SEVERE REACTION? What should I look for? Any unusual condition, such as a severe allergic reaction or a high fever. If a severe allergic reaction   occurred, it would be within a few minutes to an hour after the shot. Signs of a serious allergic reaction can include difficulty breathing, weakness, hoarseness or wheezing, a fast heartbeat, hives, dizziness, paleness, or swelling of the throat. What should I do?  Call your caregiver, or get the person to a caregiver right away.  Tell the caregiver what  happened, the date and time it happened, and when the vaccination was given.  Ask the caregiver to report the reaction by filing a Vaccine Adverse Event Reporting System (VAERS) form. Or, you can file this report through the VAERS web site at www.vaers.hhs.gov or by calling 1-800-822-7967. VAERS does not provide medical advice. HOW CAN I LEARN MORE?  Ask your caregiver. He or she can give you the vaccine package insert or suggest other sources of information.  Contact the Centers for Disease Control and Prevention (CDC):  Call 1-800-232-4636 (1-800-CDC-INFO).  Visit the CDC website at www.cdc.gov/vaccines CDC Shingles Vaccine VIS (08/22/08) Document Released: 08/31/2006 Document Revised: 01/26/2012 Document Reviewed: 02/23/2013 ExitCare Patient Information 2015 [employer]. This information is not intended to replace advice given to you by your health care provider. Make sure you discuss any questions you have with your health care provider.  

## 2014-12-28 NOTE — Progress Notes (Signed)
Monica Ochoa 02-12-1946 [id number]   History:    69 y.o.  for annual gyn exam patient was started on Climara 0.025 mg transdermal patch once a week for her vasomotor symptoms and vaginal atrophy. Patient previously had been counseled as the risks benefits and pros and cons and potential small risk of breast cancer as well as DVT and pulmonary embolism. Her history is as follows:  First pregnancy: Fourth degree tear Second pregnancy vaginal delivery Same year as a vaginal birth patient had TVH along with A&P repair and perineoplasty in [place] by a retired OB/GYN. In 2012 at [facility] and anal sphincteroplasty with perineorrhaphy secondary to fecal incontinence and external anal sphincter defect.  Patient had a normal colonoscopy in 2012. Her last bone density study was in 2015 the lowest T score was at the left femoral neck with a value of -1.6. There was no Frax analysis done. When compared with 2015 it appears that there was some slight improvement on her AP spine and stable on the left hip. Patient reports no past history of any abnormal Pap smears in the past. Her PCP is Dr. Chapman Fitch at [facility] who has been doing her blood work. All her vaccines are up-to-date with the exception of the shingles vaccine.  Past medical history,surgical history, family history and social history were all reviewed and documented in the EPIC chart.  Gynecologic History No LMP recorded. Patient has had a hysterectomy. Contraception: post menopausal status Last Pap: 2011. Results were: normal Last mammogram: 2015. Results were: Normal three-dimensional mammogram  Obstetric History OB History  Gravida Para Term Preterm AB SAB TAB Ectopic Multiple Living  3 2 2  1 1    2     # Outcome Date GA Lbr Len/2nd Weight Sex Delivery Anes PTL Lv  3 SAB           2 Term     F Vag-Spont  N Y  1 Term     M Vag-Spont  N Y       ROS: A ROS was performed and pertinent positives  and negatives are included in the history.  GENERAL: No fevers or chills. HEENT: No change in vision, no earache, sore throat or sinus congestion. NECK: No pain or stiffness. CARDIOVASCULAR: No chest pain or pressure. No palpitations. PULMONARY: No shortness of breath, cough or wheeze. GASTROINTESTINAL: No abdominal pain, nausea, vomiting or diarrhea, melena or bright red blood per rectum. GENITOURINARY: No urinary frequency, urgency, hesitancy or dysuria. MUSCULOSKELETAL: No joint or muscle pain, no back pain, no recent trauma. DERMATOLOGIC: No rash, no itching, no lesions. ENDOCRINE: No polyuria, polydipsia, no heat or cold intolerance. No recent change in weight. HEMATOLOGICAL: No anemia or easy bruising or bleeding. NEUROLOGIC: No headache, seizures, numbness, tingling or weakness. PSYCHIATRIC: No depression, no loss of interest in normal activity or change in sleep pattern.     Exam: chaperone present  BP 130/70 mmHg  Ht 5' 2.5" (1.588 m)  Wt 142 lb (64.411 kg)  BMI 25.54 kg/m2  Body mass index is 25.54 kg/(m^2).  General appearance : Well developed well nourished female. No acute distress HEENT: Neck supple, trachea midline, no carotid bruits, no thyroidmegaly Lungs: Clear to auscultation, no rhonchi or wheezes, or rib retractions  Heart: Regular rate and rhythm, no murmurs or gallops Breast:Examined in sitting and supine position were symmetrical in appearance, no palpable masses or tenderness,  no skin retraction, no nipple inversion, no nipple discharge, no  skin discoloration, no axillary or supraclavicular lymphadenopathy Abdomen: no palpable masses or tenderness, no rebound or guarding Extremities: no edema or skin discoloration or tenderness  Pelvic:  Bartholin, Urethra, Skene Glands: Within normal limits             Vagina: No gross lesions or discharge, first-degree cystocele  Cervix: Absent  Uterus  absent  Adnexa  Without masses or tenderness  Anus and perineum  normal    Rectovaginal  normal sphincter tone without palpated masses or tenderness             Hemoccult PCP provides     Assessment/Plan:  69 y.o. female for annual exam doing well on low dose transdermal estrogen patch once a week. Requisition to schedule shingles vaccine provided. We discussed importance of calcium vitamin D and regular exercise for osteoporosis prevention. We discussed importance of monthly self breast exam. PCP has been doing her blood work.   Terrance Mass MD, 11:12 AM 12/28/2014

## 2015-01-22 DIAGNOSIS — Z789 Other specified health status: Secondary | ICD-10-CM | POA: Diagnosis not present

## 2015-01-22 DIAGNOSIS — R110 Nausea: Secondary | ICD-10-CM | POA: Diagnosis not present

## 2015-01-23 DIAGNOSIS — R110 Nausea: Secondary | ICD-10-CM | POA: Diagnosis not present

## 2015-02-02 DIAGNOSIS — B9689 Other specified bacterial agents as the cause of diseases classified elsewhere: Secondary | ICD-10-CM | POA: Diagnosis not present

## 2015-02-27 DIAGNOSIS — Z0000 Encounter for general adult medical examination without abnormal findings: Secondary | ICD-10-CM | POA: Diagnosis not present

## 2015-03-01 DIAGNOSIS — Z0000 Encounter for general adult medical examination without abnormal findings: Secondary | ICD-10-CM | POA: Diagnosis not present

## 2015-04-26 DIAGNOSIS — L820 Inflamed seborrheic keratosis: Secondary | ICD-10-CM | POA: Diagnosis not present

## 2015-07-09 DIAGNOSIS — J019 Acute sinusitis, unspecified: Secondary | ICD-10-CM | POA: Diagnosis not present

## 2015-07-18 DIAGNOSIS — L570 Actinic keratosis: Secondary | ICD-10-CM | POA: Diagnosis not present

## 2015-07-18 DIAGNOSIS — L814 Other melanin hyperpigmentation: Secondary | ICD-10-CM | POA: Diagnosis not present

## 2015-07-18 DIAGNOSIS — L821 Other seborrheic keratosis: Secondary | ICD-10-CM | POA: Diagnosis not present

## 2015-07-18 DIAGNOSIS — D485 Neoplasm of uncertain behavior of skin: Secondary | ICD-10-CM | POA: Diagnosis not present

## 2015-07-18 DIAGNOSIS — D225 Melanocytic nevi of trunk: Secondary | ICD-10-CM | POA: Diagnosis not present

## 2015-07-18 DIAGNOSIS — D1801 Hemangioma of skin and subcutaneous tissue: Secondary | ICD-10-CM | POA: Diagnosis not present

## 2015-07-18 DIAGNOSIS — L918 Other hypertrophic disorders of the skin: Secondary | ICD-10-CM | POA: Diagnosis not present

## 2015-08-29 DIAGNOSIS — L821 Other seborrheic keratosis: Secondary | ICD-10-CM | POA: Diagnosis not present

## 2015-08-29 DIAGNOSIS — L820 Inflamed seborrheic keratosis: Secondary | ICD-10-CM | POA: Diagnosis not present

## 2015-08-29 DIAGNOSIS — L814 Other melanin hyperpigmentation: Secondary | ICD-10-CM | POA: Diagnosis not present

## 2015-08-29 DIAGNOSIS — L570 Actinic keratosis: Secondary | ICD-10-CM | POA: Diagnosis not present

## 2015-08-29 DIAGNOSIS — D485 Neoplasm of uncertain behavior of skin: Secondary | ICD-10-CM | POA: Diagnosis not present

## 2015-08-29 DIAGNOSIS — L918 Other hypertrophic disorders of the skin: Secondary | ICD-10-CM | POA: Diagnosis not present

## 2015-09-12 DIAGNOSIS — Z23 Encounter for immunization: Secondary | ICD-10-CM | POA: Diagnosis not present

## 2015-10-19 ENCOUNTER — Other Ambulatory Visit: Payer: Self-pay

## 2015-10-19 DIAGNOSIS — Z1231 Encounter for screening mammogram for malignant neoplasm of breast: Secondary | ICD-10-CM

## 2015-11-22 ENCOUNTER — Ambulatory Visit
Admission: RE | Admit: 2015-11-22 | Discharge: 2015-11-22 | Disposition: A | Discharge status: Home / Self Care | Payer: Medicare Other | Source: Ambulatory Visit

## 2015-11-22 DIAGNOSIS — Z1231 Encounter for screening mammogram for malignant neoplasm of breast: Secondary | ICD-10-CM | POA: Diagnosis not present

## 2015-11-22 IMAGING — MG MM SCREENING BREAST TOMO BILATERAL
8 series · 8 of 24 positions shown · non-contrast
Comparison: Previous exam(s).

CLINICAL DATA: Screening.

EXAM:
DIGITAL SCREENING BILATERAL MAMMOGRAM WITH 3D TOMO WITH CAD

[R CC]
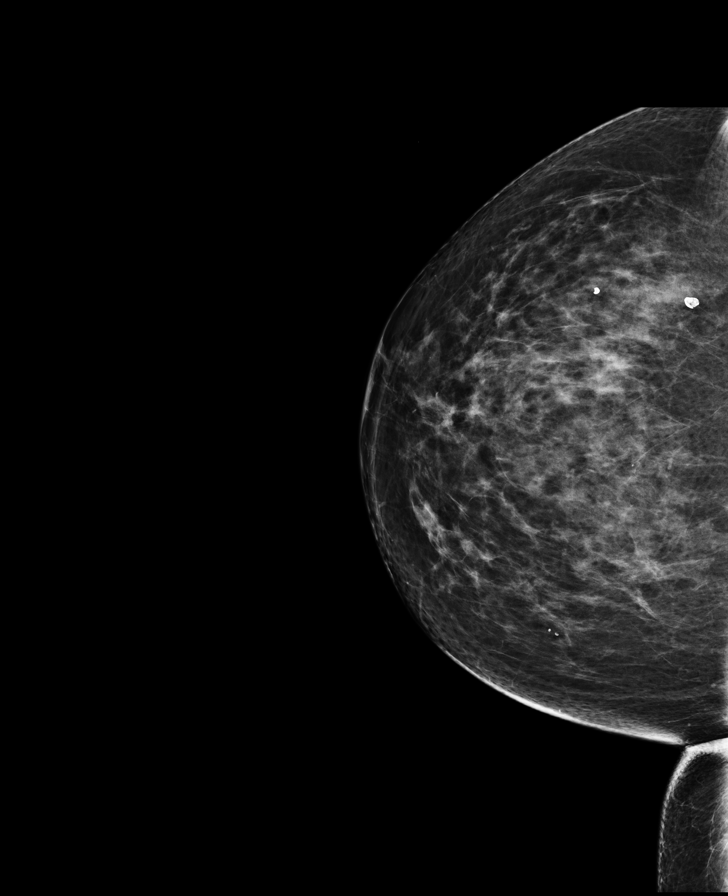

[L CC]
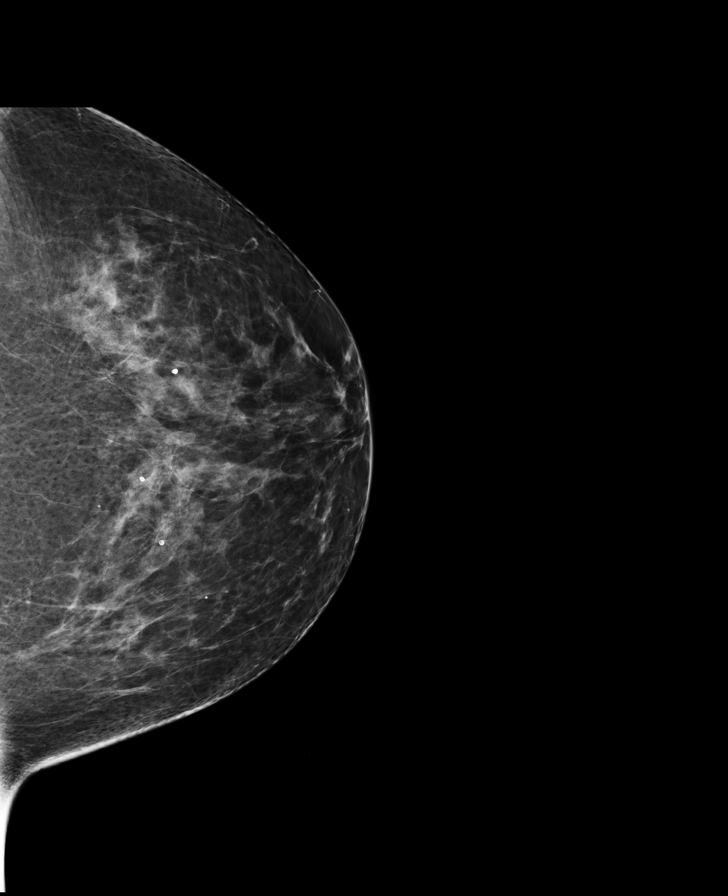

[L MLO]
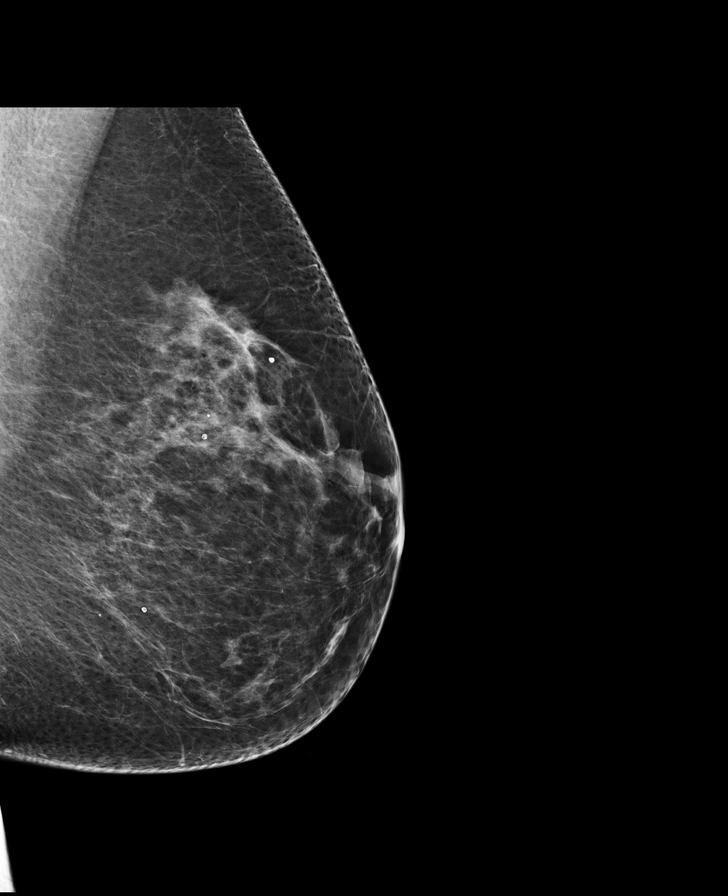

[R MLO]
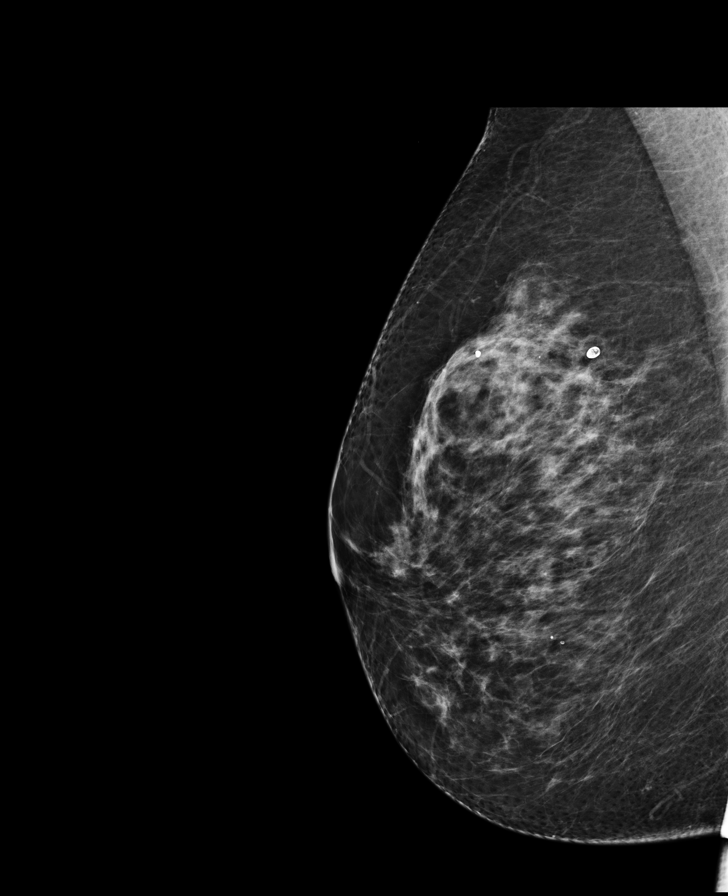

[L CC tomo · tomo slice 47/93.0]
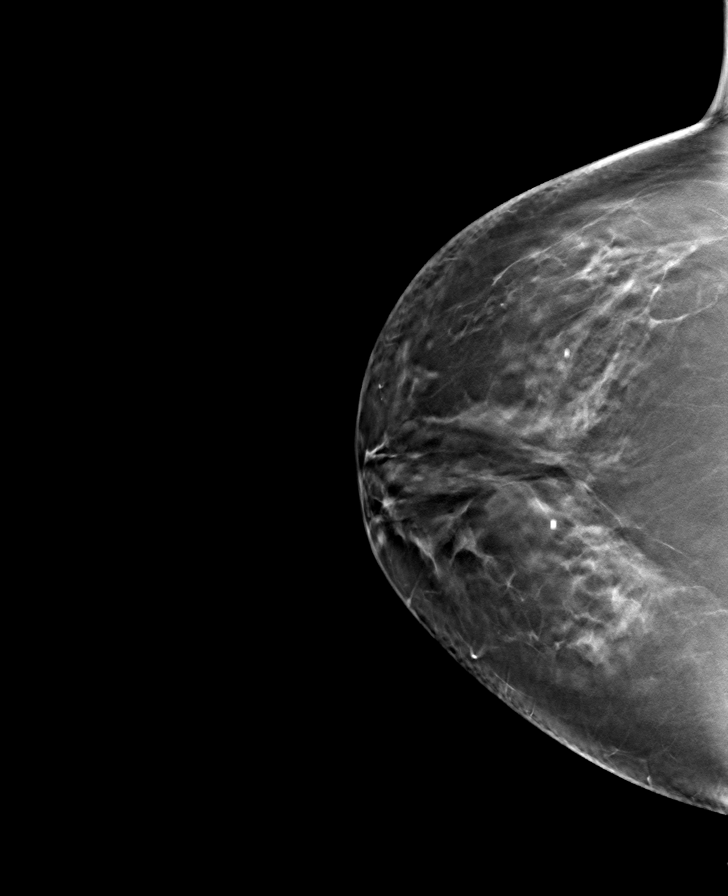

[R MLO tomo · tomo slice 45/90.0]
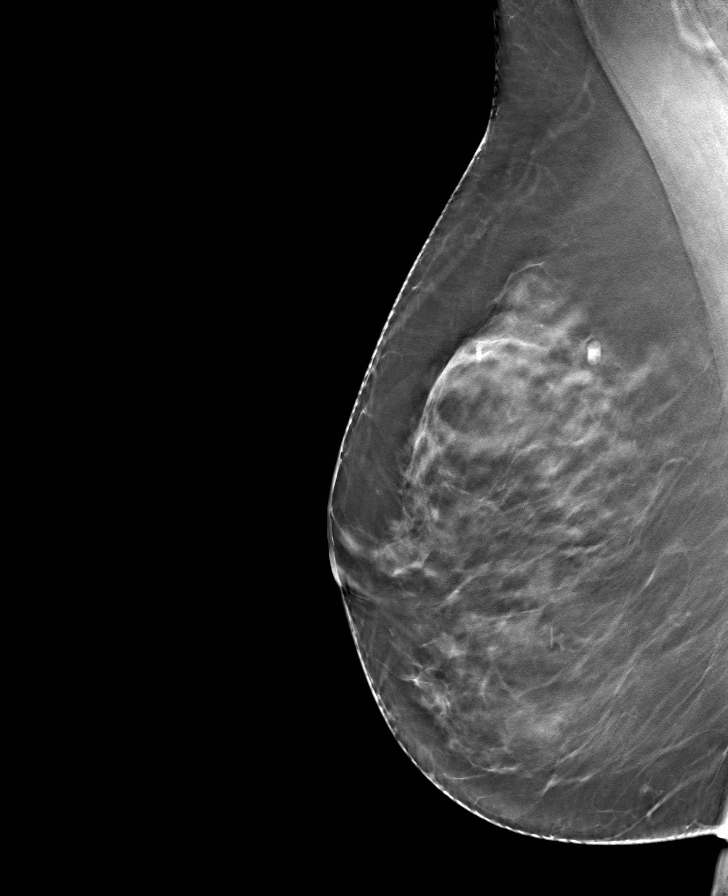

[R CC tomo · tomo slice 49/97.0]
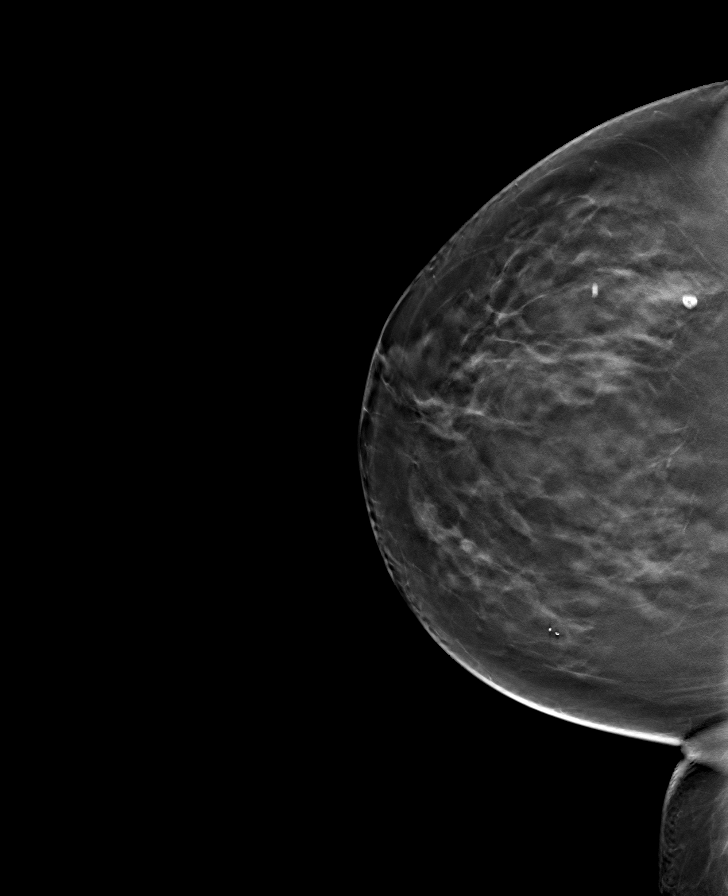

[L MLO tomo · tomo slice 43/86.0]
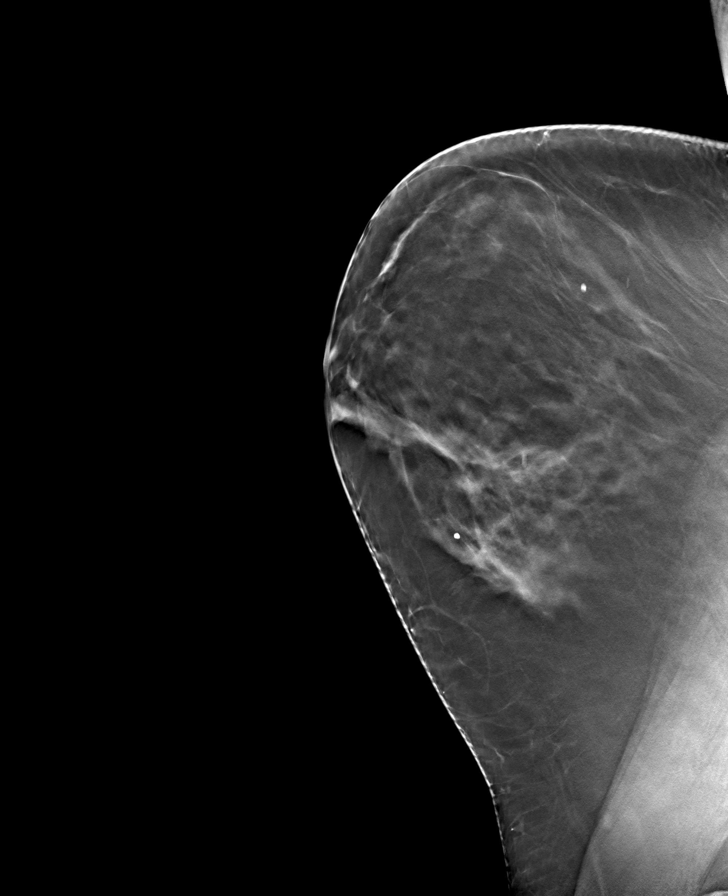

[8 of 24 positions shown; findings below may reference images not displayed]

ACR Breast Density Category b: There are scattered areas of
fibroglandular density.
FINDINGS: There are no findings suspicious for malignancy. Images were
processed with CAD.
IMPRESSION: No mammographic evidence of malignancy. A result letter of this
screening mammogram will be mailed directly to the patient.

RECOMMENDATION:
Screening mammogram in one year. (Code:[F0])

BI-RADS CATEGORY  1: Negative.

## 2015-11-29 ENCOUNTER — Other Ambulatory Visit: Payer: Self-pay | Admitting: Gynecology

## 2015-12-31 ENCOUNTER — Encounter: Payer: Medicare Other | Admitting: Gynecology

## 2016-01-26 ENCOUNTER — Other Ambulatory Visit: Payer: Self-pay | Admitting: Gynecology

## 2016-01-29 ENCOUNTER — Encounter: Payer: Medicare Other | Admitting: Gynecology

## 2016-02-14 ENCOUNTER — Ambulatory Visit (INDEPENDENT_AMBULATORY_CARE_PROVIDER_SITE_OTHER): Payer: Medicare Other | Admitting: Cardiovascular Disease

## 2016-02-14 ENCOUNTER — Encounter: Payer: Self-pay | Admitting: Cardiovascular Disease

## 2016-02-14 ENCOUNTER — Other Ambulatory Visit: Payer: Self-pay | Admitting: Cardiovascular Disease

## 2016-02-14 VITALS — BP 122/78 | HR 74 | Ht 63.0 in | Wt 148.2 lb

## 2016-02-14 DIAGNOSIS — R0789 Other chest pain: Secondary | ICD-10-CM

## 2016-02-14 DIAGNOSIS — R002 Palpitations: Secondary | ICD-10-CM | POA: Diagnosis not present

## 2016-02-14 DIAGNOSIS — E785 Hyperlipidemia, unspecified: Secondary | ICD-10-CM | POA: Diagnosis not present

## 2016-02-14 DIAGNOSIS — R42 Dizziness and giddiness: Secondary | ICD-10-CM

## 2016-02-14 HISTORY — DX: Palpitations: R00.2

## 2016-02-14 HISTORY — DX: Hyperlipidemia, unspecified: E78.5

## 2016-02-14 NOTE — Patient Instructions (Addendum)
Medication Instructions:  Your physician recommends that you continue on your current medications as directed. Please refer to the Current Medication list given to you today.  Labwork: Tsh/ft4/cbc/cmet/lp/magnesium tomorrow at [facility] lab on the first floor  Testing/Procedures: Your physician has recommended that you wear an event monitor. Event monitors are medical devices that record the heart's electrical activity. Doctors most often [place] these monitors to diagnose arrhythmias. Arrhythmias are problems with the speed or rhythm of the heartbeat. The monitor is a small, portable device. You can wear one while you do your normal daily activities. This is usually used to diagnose what is causing palpitations/syncope (passing out). 7 day [employer] office  Follow-Up: Your physician recommends that you schedule a follow-up appointment in: 1 month   If you need a refill on your cardiac medications before your next appointment, please call your pharmacy.

## 2016-02-14 NOTE — Progress Notes (Signed)
Cardiology Office Note   Date:  02/14/2016   ID:  Monica, Ochoa 08-Aug-1946, MRN [id number]  PCP:  Antony Blackbird, MD  Cardiologist:   Sharol Harness, MD   Chief Complaint  Patient presents with  . New Evaluation    establish care  pt c/o chest tightness--when she breaths deep or raises her voice; if she turns to either side it feels worse; feels fluttering and rapid heart beat at night  . Dizziness    when she feels heart flutter, and occasionally at random times--comes and goes  . Shortness of Breath    when her chest feels tight or breaths deep      History of Present Illness: Monica Ochoa is a 70 y.o. female with hyperlipidemia who presents for an evaluation of palpitations.  She has noted some episodes of heart fluttering over the last several months.  Three weeks ago she was having the episodes almost daily but lately it hasn't been occurring as frequently.  Now she gets them 2-3 times per week.  The episodes last for approximately an hour.  They're associated with dizziness and lightheadedness. She also notes chest pressure with inspiration. It happens when she raises her voice or if she is upset.  She sometimes awakens with these symptoms.   She does not drink caffeine late in the day, though she does have up to 2-3 coffees daily. She also does not use over-the-counter decongestants. She sometimes feels chest pressure when laying on her side and feels better when laying on her back.  She exercises 3-4 times per week by walking on a treadmill or pushing her grandchild in a stroller. She does not get exertional chest pain, palpitations, or shortness of breath.  She is able to get her ADLs completed without symptoms other than neck pain from a bulging disc.   She takes aspirin or takes slow, deep breaths, which helps.  She sometimes takes Xanax which also helps. She wonders whether her symptoms could be due to anxiety. She notes that she does not deal with stress as  well as she used to. edema, orthopnea, or PND.   [name] notes that her cholesterol levels have been elevated in the past. Her PCP recommended that she start a statin, but she declined. Her levels have not been checked lately.   Past Medical History  Diagnosis Date  . Rectal incontinence   . Miscarriage   . Normal delivery [name]  . Uterine prolapse   . Osteopenia   . Palpitations 02/14/2016  . Hyperlipidemia 02/14/2016    Past Surgical History  Procedure Laterality Date  . Back surgery      HERNIATED DISC   . Anal sphincteroplasty  3.20.2012    WITH PERINEORRHAPHY  . Total vaginal hysterectomy      W/ AP REPAIR  . Bladder repair      TVH W/AP REPAIR  . Abdominal hysterectomy       Current Outpatient Prescriptions  Medication Sig Dispense Refill  . aspirin 81 MG tablet Take 81 mg by mouth daily.      . calcium carbonate (OS-CAL) 600 MG TABS Take 600 mg by mouth 2 (two) times daily with a meal.      . Cholecalciferol (VITAMIN D PO) Take by mouth.      . co-enzyme Q-10 50 MG capsule Take 100 mg by mouth daily.    Marland Kitchen estradiol (CLIMARA - DOSED IN MG/24 HR) 0.025 mg/24hr patch  PLACE 1 PATCH (0.025 MG TOTAL) ONTO THE SKIN ONCE A WEEK. 4 patch 1  . Omega-3 Fatty Acids (OMEGA-3 FISH OIL PO) Take by mouth.      . Probiotic Product (PROBIOTIC COLON SUPPORT PO) Take by mouth.     No current facility-administered medications for this visit.    Allergies:   Codeine    Social History:  The patient  reports that she has never smoked. She has never used smokeless tobacco. She reports that she does not drink alcohol or use illicit drugs.   Family History:  The patient's family history includes Angina in her mother; Cervical cancer in her mother; Crohn's disease in her sister; Diabetes in her brother and father; Heart Problems in her sister; Heart disease in her mother; Hypertension in her brother, mother, and sister; Other in her father; Uterine cancer in her mother and mother.      ROS:  Please see the history of present illness.   Otherwise, review of systems are positive for none.   All other systems are reviewed and negative.    PHYSICAL EXAM: VS:  BP 122/78 mmHg  Pulse 74  Ht 5\' 3"  (1.6 m)  Wt 67.223 kg (148 lb 3.2 oz)  BMI 26.26 kg/m2 , BMI Body mass index is 26.26 kg/(m^2). GENERAL:  Well appearing HEENT:  Pupils equal round and reactive, fundi not visualized, oral mucosa unremarkable NECK:  No jugular venous distention, waveform within normal limits, carotid upstroke brisk and symmetric, no bruits, no thyromegaly LYMPHATICS:  No cervical adenopathy LUNGS:  Clear to auscultation bilaterally HEART:  RRR.  PMI not displaced or sustained,S1 and S2 within normal limits, no S3, no S4, no clicks, no rubs, no  murmurs ABD:  Flat, positive bowel sounds normal in frequency in pitch, no bruits, no rebound, no guarding, no midline pulsatile mass, no hepatomegaly, no splenomegaly EXT:  2 plus pulses throughout, no edema, no cyanosis no clubbing SKIN:  No rashes no nodules NEURO:  Cranial nerves II through XII grossly intact, motor grossly intact throughout PSYCH:  Cognitively intact, oriented to person place and time    EKG:  EKG is ordered today. The ekg ordered today demonstrates sinus rhythm. Rate 74 bpm.  Recent Labs: No results found for requested labs within last 365 days.    Lipid Panel No results found for: CHOL, TRIG, HDL, CHOLHDL, VLDL, LDLCALC, LDLDIRECT    Wt Readings from Last 3 Encounters:  02/14/16 67.223 kg (148 lb 3.2 oz)  12/28/14 64.411 kg (142 lb)  12/14/13 64.411 kg (142 lb)     ASSESSMENT AND PLAN:  # Palpitations: It is unclear what causing [name]'s symptoms. The episodes last for up to an hour, which makes it less likely that she is having PACs or PVCs. I suspect that it may be anxiety related. However, we will check a CBC, resident metabolic panel, magnesium, TSH and free T4. We will also obtain a 7 day event monitor to  associate her symptoms with the underlying rhythm.  # Hyperlipidemia: [name] has known hyperlipidemia. We will check a lipid panel and a CMP. We discussed the fact that although statins due to cost symptoms were some people, mostly water able to tolerate them. We will make a decision about when to start once we know her baseline levels.   # Atypical chest pain:  Ms. Genung reports symptoms of atypical chest pain.  We will obtain a treadmill stress test to evaluate for ischemia.     Current  medicines are reviewed at length with the patient today.  The patient does not have concerns regarding medicines.  The following changes have been made:  no change  Labs/ tests ordered today include:   Orders Placed This Encounter  Procedures  . Basic metabolic panel  . CBC with Differential/Platelet  . TSH  . T4, free  . Magnesium  . Exercise Tolerance Test  . Cardiac event monitor  . EKG 12-Lead    Disposition:   FU with Yadier Bramhall C. Oval Linsey, MD, [facility] in 1 month.     This note was written with the assistance of speech recognition software.  Please excuse any transcriptional errors.  Signed, Kein Carlberg C. Oval Linsey, MD, [facility]  02/14/2016 9:19 AM    [facility]

## 2016-02-15 DIAGNOSIS — R002 Palpitations: Secondary | ICD-10-CM | POA: Diagnosis not present

## 2016-02-15 DIAGNOSIS — E785 Hyperlipidemia, unspecified: Secondary | ICD-10-CM | POA: Diagnosis not present

## 2016-02-15 LAB — COMPREHENSIVE METABOLIC PANEL
ALT: 17 U/L (ref 6–29)
AST: 19 U/L (ref 10–35)
Albumin: 4.4 g/dL (ref 3.6–5.1)
Alkaline Phosphatase: 61 U/L (ref 33–130)
BUN: 11 mg/dL (ref 7–25)
CO2: 27 mmol/L (ref 20–31)
Calcium: 9.1 mg/dL (ref 8.6–10.4)
Chloride: 101 mmol/L (ref 98–110)
Creat: 0.84 mg/dL (ref 0.50–0.99)
GLUCOSE: 104 mg/dL — AB (ref 65–99)
Potassium: 4.5 mmol/L (ref 3.5–5.3)
Sodium: 138 mmol/L (ref 135–146)
TOTAL PROTEIN: 7.1 g/dL (ref 6.1–8.1)
Total Bilirubin: 0.5 mg/dL (ref 0.2–1.2)

## 2016-02-15 LAB — LIPID PANEL
Cholesterol: 269 mg/dL — ABNORMAL HIGH (ref 125–200)
HDL: 65 mg/dL (ref >=46)
LDL Cholesterol: 179 mg/dL — ABNORMAL HIGH (ref <130)
Total CHOL/HDL Ratio: 4.1 Ratio (ref <=5.0)
Triglycerides: 126 mg/dL (ref <150)
VLDL: 25 mg/dL (ref <30)

## 2016-02-15 LAB — T4, FREE: Free T4: 1 ng/dL (ref 0.8–1.8)

## 2016-02-15 LAB — CBC WITH DIFFERENTIAL/PLATELET
Basophils Absolute: 0.1 10*3/uL (ref 0.0–0.1)
Basophils Relative: 1 % (ref 0–1)
Eosinophils Absolute: 0.2 10*3/uL (ref 0.0–0.7)
Eosinophils Relative: 3 % (ref 0–5)
HCT: 42 % (ref 36.0–46.0)
HEMOGLOBIN: 14 g/dL (ref 12.0–15.0)
LYMPHS ABS: 2.3 10*3/uL (ref 0.7–4.0)
Lymphocytes Relative: 37 % (ref 12–46)
MCH: 28.9 pg (ref 26.0–34.0)
MCHC: 33.3 g/dL (ref 30.0–36.0)
MCV: 86.6 fL (ref 78.0–100.0)
MPV: 9 fL (ref 8.6–12.4)
Monocytes Absolute: 0.5 10*3/uL (ref 0.1–1.0)
Monocytes Relative: 8 % (ref 3–12)
NEUTROS ABS: 3.2 10*3/uL (ref 1.7–7.7)
Neutrophils Relative %: 51 % (ref 43–77)
PLATELETS: 288 10*3/uL (ref 150–400)
RBC: 4.85 MIL/uL (ref 3.87–5.11)
RDW: 13.8 % (ref 11.5–15.5)
WBC: 6.3 10*3/uL (ref 4.0–10.5)

## 2016-02-15 LAB — TSH: TSH: 2.58 m[IU]/L

## 2016-02-15 LAB — MAGNESIUM: Magnesium: 2 mg/dL (ref 1.5–2.5)

## 2016-02-16 ENCOUNTER — Encounter: Payer: Self-pay | Admitting: Cardiovascular Disease

## 2016-02-16 DIAGNOSIS — R0789 Other chest pain: Secondary | ICD-10-CM | POA: Insufficient documentation

## 2016-02-18 ENCOUNTER — Encounter: Payer: Self-pay | Admitting: Cardiovascular Disease

## 2016-02-18 ENCOUNTER — Ambulatory Visit (INDEPENDENT_AMBULATORY_CARE_PROVIDER_SITE_OTHER): Payer: Medicare Other

## 2016-02-18 DIAGNOSIS — R002 Palpitations: Secondary | ICD-10-CM

## 2016-02-18 DIAGNOSIS — R42 Dizziness and giddiness: Secondary | ICD-10-CM

## 2016-02-19 DIAGNOSIS — R002 Palpitations: Secondary | ICD-10-CM | POA: Diagnosis not present

## 2016-02-22 NOTE — Progress Notes (Signed)
lmtc 4/7

## 2016-02-28 ENCOUNTER — Encounter: Payer: Medicare Other | Admitting: Gynecology

## 2016-03-14 ENCOUNTER — Encounter: Payer: Self-pay | Admitting: *Deleted

## 2016-03-14 NOTE — Telephone Encounter (Signed)
This encounter was created in error - please disregard.

## 2016-03-16 NOTE — Progress Notes (Signed)
Cardiology Office Note   Date:  03/17/2016   ID:  Monica Ochoa, DOB 28-Nov-1945, MRN [id number]  PCP:  Monica Blackbird, MD  Cardiologist:   Monica Latch, MD   Chief Complaint  Patient presents with  . Follow-up    SOB;randomly. CHEST PAIN;none. LIGHTHEADED/DIZZINESS;randomly.PAIN OR CRAMPING IN LEGS;none. EDEMA; none.    Patient ID: Monica Ochoa is a 70 y.o. female with hyperlipidemia who presents for follow up on palpitations.    Interval History 03/17/16: Monica Ochoa continues to have intermittent palpitations. It typically occurs in stressful situations. She noticed that she did not have any while visiting the beach. The episodes still occur 2-3 times per week and lasts up to a couple hours at a time. She does not have any associated chest pain but does get short of breath. She feels as though she has been more anxious lately. In the past she has taken Xanax which did help. She only uses this once to twice per week.   she did not have any episodes while wearing the monitor for the first week so was continued for an additional week and she did feel some palpitations at that time.  Her event monitor revealed occasional PVCs but no other arrhythmias.  Monica Ochoa notes that her cholesterol has been elevated for approximately 15 years. She has been reluctant to start any statin medications due to potential for side effects. Lately she started Weight Watchers and has lost a few pounds.   History of Present Illness 02/14/16:  She has noted some episodes of heart fluttering over the last several months.  Three weeks ago she was having the episodes almost daily but lately it hasn't been occurring as frequently.  Now she gets them 2-3 times per week.  The episodes last for approximately an hour.  They're associated with dizziness and lightheadedness. She also notes chest pressure with inspiration. It happens when she raises her voice or if she is upset.  She sometimes awakens with these  symptoms.   She does not drink caffeine late in the day, though she does have up to 2-3 coffees daily. She also does not use over-the-counter decongestants. She sometimes feels chest pressure when laying on her side and feels better when laying on her back.  She exercises 3-4 times per week by walking on a treadmill or pushing her grandchild in a stroller. She does not get exertional chest pain, palpitations, or shortness of breath.  She is able to get her ADLs completed without symptoms other than neck pain from a bulging disc.   She takes aspirin or takes slow, deep breaths, which helps.  She sometimes takes Xanax which also helps. She wonders whether her symptoms could be due to anxiety. She notes that she does not deal with stress as well as she used to. edema, orthopnea, or PND.   Monica Ochoa notes that her cholesterol levels have been elevated in the past. Her PCP recommended that she start a statin, but she declined. Her levels have not been checked lately.   Past Medical History  Diagnosis Date  . Rectal incontinence   . Miscarriage   . Normal delivery [place]  . Uterine prolapse   . Osteopenia   . Palpitations 02/14/2016  . Hyperlipidemia 02/14/2016  . Atypical chest pain 02/16/2016  . PVC's (premature ventricular contractions) 03/17/2016    Past Surgical History  Procedure Laterality Date  . Back surgery      HERNIATED DISC   .  Anal sphincteroplasty  3.20.2012    WITH PERINEORRHAPHY  . Total vaginal hysterectomy      W/ AP REPAIR  . Bladder repair      TVH W/AP REPAIR  . Abdominal hysterectomy       Current Outpatient Prescriptions  Medication Sig Dispense Refill  . aspirin 81 MG tablet Take 81 mg by mouth daily.      . calcium carbonate (OS-CAL) 600 MG TABS Take 600 mg by mouth 2 (two) times daily with a meal.      . Cholecalciferol (VITAMIN D PO) Take by mouth.      . co-enzyme Q-10 50 MG capsule Take 100 mg by mouth daily.    Marland Kitchen estradiol (CLIMARA - DOSED IN MG/24 HR)  0.025 mg/24hr patch PLACE 1 PATCH (0.025 MG TOTAL) ONTO THE SKIN ONCE A WEEK. 4 patch 1  . Omega-3 Fatty Acids (OMEGA-3 FISH OIL PO) Take by mouth.      . Probiotic Product (PROBIOTIC COLON SUPPORT PO) Take by mouth.     No current facility-administered medications for this visit.    Allergies:   Codeine    Social History:  The patient  reports that she has never smoked. She has never used smokeless tobacco. She reports that she does not drink alcohol or use illicit drugs.   Family History:  The patient's family history includes Angina in her mother; Cervical cancer in her mother; Crohn's disease in her sister; Diabetes in her brother and father; Heart Problems in her sister; Heart disease in her mother; Hypertension in her brother, mother, and sister; Other in her father; Uterine cancer in her mother.    ROS:  Please see the history of present illness.   Otherwise, review of systems are positive for none.   All other systems are reviewed and negative.    PHYSICAL EXAM: VS:  BP 117/66 mmHg  Pulse 77  Ht 5\' 3"  (1.6 m)  Wt 66.679 kg (147 lb)  BMI 26.05 kg/m2 , BMI Body mass index is 26.05 kg/(m^2). GENERAL:  Well appearing HEENT:  Pupils equal round and reactive, fundi not visualized, oral mucosa unremarkable NECK:  No jugular venous distention, waveform within normal limits, carotid upstroke brisk and symmetric, no bruits, no thyromegaly LYMPHATICS:  No cervical adenopathy LUNGS:  Clear to auscultation bilaterally HEART:  RRR.  PMI not displaced or sustained,S1 and S2 within normal limits, no S3, no S4, no clicks, no rubs, no  murmurs ABD:  Flat, positive bowel sounds normal in frequency in pitch, no bruits, no rebound, no guarding, no midline pulsatile mass, no hepatomegaly, no splenomegaly EXT:  2 plus pulses throughout, no edema, no cyanosis no clubbing SKIN:  No rashes no nodules NEURO:  Cranial nerves II through XII grossly intact, motor grossly intact throughout PSYCH:   Cognitively intact, oriented to person place and time    EKG:  EKG is not ordered today.  14 day Event Monitor 02/2016  Quality: Fair.  Baseline artifact. Predominant rhythm: Sinus rhythm Average heart rate: 74 bpm Max heart rate: 123 bpm Min heart rate: 54 bpm  Occasional PVCs were noted. Patient reported symptoms at these times as well as at times when sinus rhythm was observed.   Recent Labs: 02/14/2016: ALT 17; BUN 11; Creat 0.84; Hemoglobin 14.0; Magnesium 2.0; Platelets 288; Potassium 4.5; Sodium 138; TSH 2.58    Lipid Panel    Component Value Date/Time   CHOL 269* 02/14/2016 0845   TRIG 126 02/14/2016 0845   HDL  65 02/14/2016 0845   CHOLHDL 4.1 02/14/2016 0845   VLDL 25 02/14/2016 0845   LDLCALC 179* 02/14/2016 0845      Wt Readings from Last 3 Encounters:  03/17/16 66.679 kg (147 lb)  02/14/16 67.223 kg (148 lb 3.2 oz)  12/28/14 64.411 kg (142 lb)     ASSESSMENT AND PLAN:  # PVCs: Ms. [place] event monitor revealed occasional PVCs. We reviewed the monitor and discussed the fact that these are not dangerous. All her other laboratory testing has been unremarkable. We discussed the fact that treating these would likely cause her blood pressure to the lower which may last dizziness. She elects not to start any medications at this time.  I suspect that there may be a significant anxiety component. I've asked her to follow-up on this with her primary care physician.  # Hyperlipidemia:  Cholesterol levels are elevated and her ASCVD 10 year risk is 7% he would like to continue to work on her diet and exercise and has recently started the weight watcher's program. We will repeat her lipids in 6 months prior to her next appointment.   # Atypical chest pain: Resolved.   Current medicines are reviewed at length with the patient today.  The patient does not have concerns regarding medicines.  The following changes have been made:  no change  Labs/ tests ordered today  include:   No orders of the defined types were placed in this encounter.    Disposition:   FU with Clif Serio C. Oval Linsey, MD, [facility] in 6 months.     This note was written with the assistance of speech recognition software.  Please excuse any transcriptional errors.  Signed, Krystianna Soth C. Oval Linsey, MD, [facility]  03/17/2016 1:05 PM    [facility]

## 2016-03-17 ENCOUNTER — Ambulatory Visit (INDEPENDENT_AMBULATORY_CARE_PROVIDER_SITE_OTHER): Payer: Medicare Other | Admitting: Cardiovascular Disease

## 2016-03-17 ENCOUNTER — Encounter: Payer: Self-pay | Admitting: Cardiovascular Disease

## 2016-03-17 VITALS — BP 117/66 | HR 77 | Ht 63.0 in | Wt 147.0 lb

## 2016-03-17 DIAGNOSIS — R0789 Other chest pain: Secondary | ICD-10-CM | POA: Diagnosis not present

## 2016-03-17 DIAGNOSIS — E785 Hyperlipidemia, unspecified: Secondary | ICD-10-CM

## 2016-03-17 DIAGNOSIS — I493 Ventricular premature depolarization: Secondary | ICD-10-CM

## 2016-03-17 HISTORY — DX: Ventricular premature depolarization: I49.3

## 2016-03-17 NOTE — Patient Instructions (Signed)
Medication Instructions:  Your physician recommends that you continue on your current medications as directed. Please refer to the Current Medication list given to you today.   Labwork: Fasting lipid panel in 6 month   Testing/Procedures: none  Follow-Up: Your physician wants you to follow-up in: 6 month ov a few days after labs You will receive a reminder letter in the mail two months in advance. If you don't receive a letter, please call our office to schedule the follow-up appointment.  If you need a refill on your cardiac medications before your next appointment, please call your pharmacy.

## 2016-03-22 ENCOUNTER — Other Ambulatory Visit: Payer: Self-pay | Admitting: Gynecology

## 2016-04-07 ENCOUNTER — Encounter: Payer: Self-pay | Admitting: Gynecology

## 2016-04-07 ENCOUNTER — Ambulatory Visit (INDEPENDENT_AMBULATORY_CARE_PROVIDER_SITE_OTHER): Payer: Medicare Other | Admitting: Gynecology

## 2016-04-07 VITALS — BP 134/78 | Ht 63.0 in | Wt 148.3 lb

## 2016-04-07 DIAGNOSIS — Z01419 Encounter for gynecological examination (general) (routine) without abnormal findings: Secondary | ICD-10-CM

## 2016-04-07 NOTE — Progress Notes (Signed)
Monica Ochoa 1946/01/06 [id number]   History:    70 y.o.  for annual gyn exam with no complaints today. Patient had previous been on Climara transdermal patch 0.025 mg twice a week and stopped it a month ago and she's having no vasomotor symptoms. Patient's past medical history as follows:  First pregnancy: Fourth degree tear Second pregnancy vaginal delivery Same year as a vaginal birth patient had TVH along with A&P repair and perineoplasty in [place] by a retired OB/GYN. In 2012 at [facility] and anal sphincteroplasty with perineorrhaphy secondary to fecal incontinence and external anal sphincter defect.  Patient had a normal colonoscopy in 2012. Her last bone density study was in 2015 the lowest T score was at the left femoral neck with a value of -1.6. There was no Frax analysis done. When compared with 2015 it appears that there was some slight improvement on her AP spine and stable on the left hip. Patient reports no past history of any abnormal Pap smears in the past. Her PCP is Dr. Chapman Fitch at [facility] who has been doing her blood work. All her vaccines are up-to-date with the exception of the shingles vaccine.  Past medical history,surgical history, family history and social history were all reviewed and documented in the EPIC chart.  Gynecologic History No LMP recorded. Patient has had a hysterectomy. Contraception: status post hysterectomy Last Pap: 2011. Results were: normal Last mammogram: 2017. Results were: normal  Obstetric History OB History  Gravida Para Term Preterm AB SAB TAB Ectopic Multiple Living  3 2 2  1 1    2     # Outcome Date GA Lbr Len/2nd Weight Sex Delivery Anes PTL Lv  3 SAB           2 Term     F Vag-Spont  N Y  1 Term     M Vag-Spont  N Y       ROS: A ROS was performed and pertinent positives and negatives are included in the history.  GENERAL: No fevers or chills. HEENT: No change in vision, no earache,  sore throat or sinus congestion. NECK: No pain or stiffness. CARDIOVASCULAR: No chest pain or pressure. No palpitations. PULMONARY: No shortness of breath, cough or wheeze. GASTROINTESTINAL: No abdominal pain, nausea, vomiting or diarrhea, melena or bright red blood per rectum. GENITOURINARY: No urinary frequency, urgency, hesitancy or dysuria. MUSCULOSKELETAL: No joint or muscle pain, no back pain, no recent trauma. DERMATOLOGIC: No rash, no itching, no lesions. ENDOCRINE: No polyuria, polydipsia, no heat or cold intolerance. No recent change in weight. HEMATOLOGICAL: No anemia or easy bruising or bleeding. NEUROLOGIC: No headache, seizures, numbness, tingling or weakness. PSYCHIATRIC: No depression, no loss of interest in normal activity or change in sleep pattern.     Exam: chaperone present  BP 134/78 mmHg  Ht 5\' 3"  (1.6 m)  Wt 148 lb 4.8 oz (67.268 kg)  BMI 26.28 kg/m2  Body mass index is 26.28 kg/(m^2).  General appearance : Well developed well nourished female. No acute distress HEENT: Eyes: no retinal hemorrhage or exudates,  Neck supple, trachea midline, no carotid bruits, no thyroidmegaly Lungs: Clear to auscultation, no rhonchi or wheezes, or rib retractions  Heart: Regular rate and rhythm, no murmurs or gallops Breast:Examined in sitting and supine position were symmetrical in appearance, no palpable masses or tenderness,  no skin retraction, no nipple inversion, no nipple discharge, no skin discoloration, no axillary or supraclavicular lymphadenopathy Abdomen: no  palpable masses or tenderness, no rebound or guarding Extremities: no edema or skin discoloration or tenderness  Pelvic:  Bartholin, Urethra, Skene Glands: Within normal limits             Vagina: No gross lesions or discharge, atrophic changes  Cervix: absent  Uterus  absent  Adnexa  Without masses or tenderness  Anus and perineum  normal   Rectovaginal  normal sphincter tone without palpated masses or tenderness              Hemoccult PCP provides     Assessment/Plan:  70 y.o. female for annual exam with some vaginal atrophy patient sexually active but not symptomatic. I've given her literature formation on Vagifem Anes she would like to read about it and she would contact the office if she was assisted prescribed to her. Patient will be due for her next bone density study at time of her mammogram in January 2018. PCP is been doing her blood work. Patient does not need Pap smears according to the new guidelines. Patient is encouraged inferior calcium vitamin D and regular exercise for osteoporosis prevention.   Terrance Mass MD, 4:09 PM 04/07/2016

## 2016-04-07 NOTE — Patient Instructions (Signed)
Estradiol vaginal tablets What is this medicine? ESTRADIOL (es tra DYE ole) vaginal tablet is used to help relieve symptoms of vaginal irritation and dryness that occurs in some women during menopause. This medicine may be used for other purposes; ask your health care provider or pharmacist if you have questions. What should I tell my health care provider before I take this medicine? They need to know if you have any of these conditions: -abnormal vaginal bleeding -blood vessel disease or blood clots -breast, cervical, endometrial, ovarian, liver, or uterine cancer -dementia -diabetes -gallbladder disease -heart disease or recent heart attack -high blood pressure -high cholesterol -high level of calcium in the blood -hysterectomy -kidney disease -liver disease -migraine headaches -protein C deficiency -protein S deficiency -stroke -systemic lupus erythematosus (SLE) -tobacco smoker -an unusual or allergic reaction to estrogens, other hormones, medicines, foods, dyes, or preservatives -pregnant or trying to get pregnant -breast-feeding How should I use this medicine? This medicine is only for use in the vagina. Do not take by mouth. Wash and dry your hands before and after use. Read package directions carefully. Unwrap the applicator package. Be sure to use a new applicator for each dose. Use at the same time each day. If the tablet has fallen out of the applicator, but is still in the package, carefully place it back into the applicator. If the tablet has fallen out of the package, that applicator should be thrown out and you should use a new applicator containing a new tablet. Lie on your back, part and bend your knees. Gently insert the applicator as far as comfortably possible into the vagina. Then, gently press the plunger until the plunger is fully depressed. This will release the tablet into the vagina. Gently remove the applicator. Throw away the applicator after use. Do not use  your medicine more often than directed. Do not stop using except on the advice of your doctor or health care professional. Talk to your pediatrician regarding the use of this medicine in children. This medicine is not approved for use in children. A patient package insert for the product will be given with each prescription and refill. Read this sheet carefully each time. The sheet may change frequently. Overdosage: If you think you have taken too much of this medicine contact a poison control center or emergency room at once. NOTE: This medicine is only for you. Do not share this medicine with others. What if I miss a dose? If you miss a dose, take it as soon as you can. If it is almost time for your next dose, take only that dose. Do not take double or extra doses. What may interact with this medicine? Do not take this medicine with any of the following medications: -aromatase inhibitors like aminoglutethimide, anastrozole, exemestane, letrozole, testolactone This medicine may also interact with the following medications: -antibiotics used to treat tuberculosis like rifabutin, rifampin and rifapentene -raloxifene or tamoxifen -warfarin This list may not describe all possible interactions. Give your health care provider a list of all the medicines, herbs, non-prescription drugs, or dietary supplements you use. Also tell them if you smoke, drink alcohol, or use illegal drugs. Some items may interact with your medicine. What should I watch for while using this medicine? Visit your health care professional for regular checks on your progress. You will need a regular breast and pelvic exam. You should also discuss the need for regular mammograms with your health care professional, and follow his or her guidelines. This medicine can make  your body retain fluid, making your fingers, hands, or ankles swell. Your blood pressure can go up. Contact your doctor or health care professional if you feel you are  retaining fluid. If you have any reason to think you are pregnant; stop taking this medicine at once and contact your doctor or health care professional. Tobacco smoking increases the risk of getting a blood clot or having a stroke, especially if you are more than 69 years old. You are strongly advised not to smoke. If you wear contact lenses and notice visual changes, or if the lenses begin to feel uncomfortable, consult your eye care specialist. If you are going to have elective surgery, you may need to stop taking this medicine beforehand. Consult your health care professional for advice prior to scheduling the surgery. What side effects may I notice from receiving this medicine? Side effects that you should report to your doctor or health care professional as soon as possible: -allergic reactions like skin rash, itching or hives, swelling of the face, lips, or tongue -breast tissue changes or discharge -changes in vision -chest pain -confusion, trouble speaking or understanding -dark urine -general ill feeling or flu-like symptoms -light-colored stools -nausea, vomiting -pain, swelling, warmth in the leg -right upper belly pain -severe headaches -shortness of breath -sudden numbness or weakness of the face, arm or leg -trouble walking, dizziness, loss of balance or coordination -unusual vaginal bleeding -yellowing of the eyes or skin Side effects that usually do not require medical attention (report to your doctor or health care professional if they continue or are bothersome): -hair loss -increased hunger or thirst -increased urination -symptoms of vaginal infection like itching, irritation or unusual discharge -unusually weak or tired This list may not describe all possible side effects. Call your doctor for medical advice about side effects. You may report side effects to FDA at 1-800-FDA-1088. Where should I keep my medicine? Keep out of the reach of children. Store at room  temperature between 15 and 30 degrees C (59 and 86 degrees F). Throw away any unused medicine after the expiration date. NOTE: This sheet is a summary. It may not cover all possible information. If you have questions about this medicine, talk to your doctor, pharmacist, or health care provider.    2016, [employer]. (2014-10-18 09:22:51)

## 2016-04-23 ENCOUNTER — Telehealth: Payer: Self-pay | Admitting: *Deleted

## 2016-04-23 DIAGNOSIS — E785 Hyperlipidemia, unspecified: Secondary | ICD-10-CM

## 2016-04-23 NOTE — Telephone Encounter (Signed)
Lipid orders mailed to patient for follow up lab

## 2016-04-25 DIAGNOSIS — Z0000 Encounter for general adult medical examination without abnormal findings: Secondary | ICD-10-CM | POA: Diagnosis not present

## 2016-04-25 DIAGNOSIS — Z23 Encounter for immunization: Secondary | ICD-10-CM | POA: Diagnosis not present

## 2016-07-16 DIAGNOSIS — E785 Hyperlipidemia, unspecified: Secondary | ICD-10-CM | POA: Diagnosis not present

## 2016-07-16 DIAGNOSIS — R7301 Impaired fasting glucose: Secondary | ICD-10-CM | POA: Diagnosis not present

## 2016-10-02 DIAGNOSIS — B351 Tinea unguium: Secondary | ICD-10-CM | POA: Diagnosis not present

## 2016-10-02 DIAGNOSIS — L819 Disorder of pigmentation, unspecified: Secondary | ICD-10-CM | POA: Diagnosis not present

## 2016-10-02 DIAGNOSIS — L814 Other melanin hyperpigmentation: Secondary | ICD-10-CM | POA: Diagnosis not present

## 2016-10-02 DIAGNOSIS — D1801 Hemangioma of skin and subcutaneous tissue: Secondary | ICD-10-CM | POA: Diagnosis not present

## 2016-10-02 DIAGNOSIS — L821 Other seborrheic keratosis: Secondary | ICD-10-CM | POA: Diagnosis not present

## 2016-10-10 DIAGNOSIS — Z23 Encounter for immunization: Secondary | ICD-10-CM | POA: Diagnosis not present

## 2017-01-05 ENCOUNTER — Telehealth: Payer: Self-pay | Admitting: *Deleted

## 2017-01-05 ENCOUNTER — Other Ambulatory Visit: Payer: Self-pay | Admitting: Gynecology

## 2017-01-05 DIAGNOSIS — Z1231 Encounter for screening mammogram for malignant neoplasm of breast: Secondary | ICD-10-CM

## 2017-01-05 DIAGNOSIS — E2839 Other primary ovarian failure: Secondary | ICD-10-CM

## 2017-01-05 NOTE — Telephone Encounter (Signed)
Pt called requesting dexa order placed at breast center , order placed pt will contact to schedule.

## 2017-02-18 ENCOUNTER — Ambulatory Visit
Admission: RE | Admit: 2017-02-18 | Discharge: 2017-02-18 | Disposition: A | Discharge status: Home / Self Care | Payer: Medicare Other | Source: Ambulatory Visit | Attending: Gynecology | Admitting: Gynecology

## 2017-02-18 ENCOUNTER — Ambulatory Visit: Payer: Medicare Other

## 2017-02-18 DIAGNOSIS — M8589 Other specified disorders of bone density and structure, multiple sites: Secondary | ICD-10-CM | POA: Diagnosis not present

## 2017-02-18 DIAGNOSIS — Z780 Asymptomatic menopausal state: Secondary | ICD-10-CM | POA: Diagnosis not present

## 2017-02-18 DIAGNOSIS — Z1231 Encounter for screening mammogram for malignant neoplasm of breast: Secondary | ICD-10-CM | POA: Diagnosis not present

## 2017-02-18 DIAGNOSIS — E2839 Other primary ovarian failure: Secondary | ICD-10-CM

## 2017-02-18 IMAGING — MG 2D DIGITAL SCREENING BILATERAL MAMMOGRAM WITH CAD AND ADJUNCT TO
8 of 13 series · 8 of 29 positions shown · non-contrast
Comparison: Previous exam(s).

CLINICAL DATA: Screening.

EXAM:
2D DIGITAL SCREENING BILATERAL MAMMOGRAM WITH CAD AND ADJUNCT TOMO

[R CC (1 of 2)]
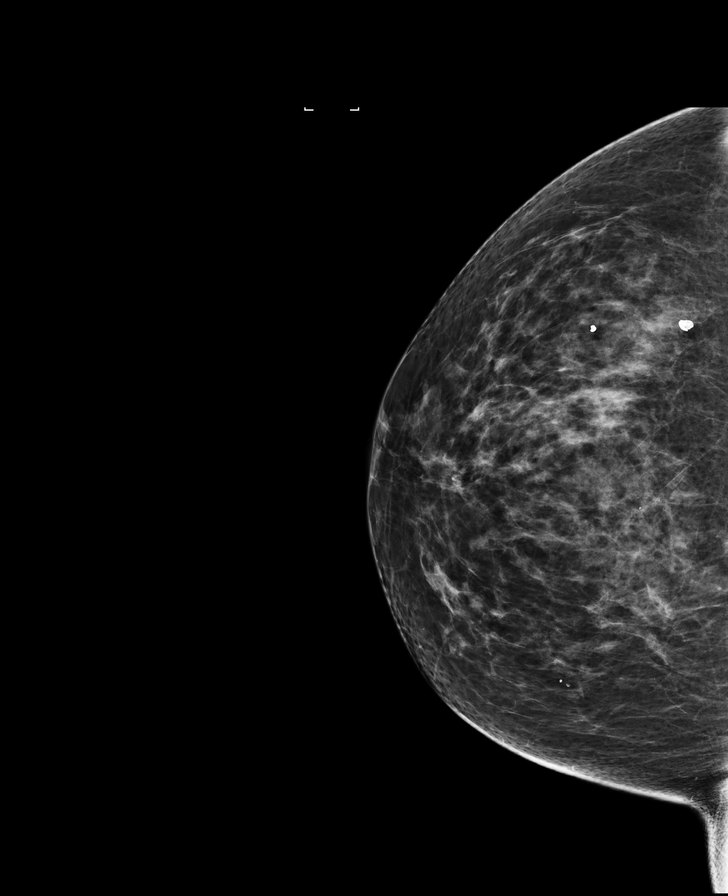

[L CC synth-2D]
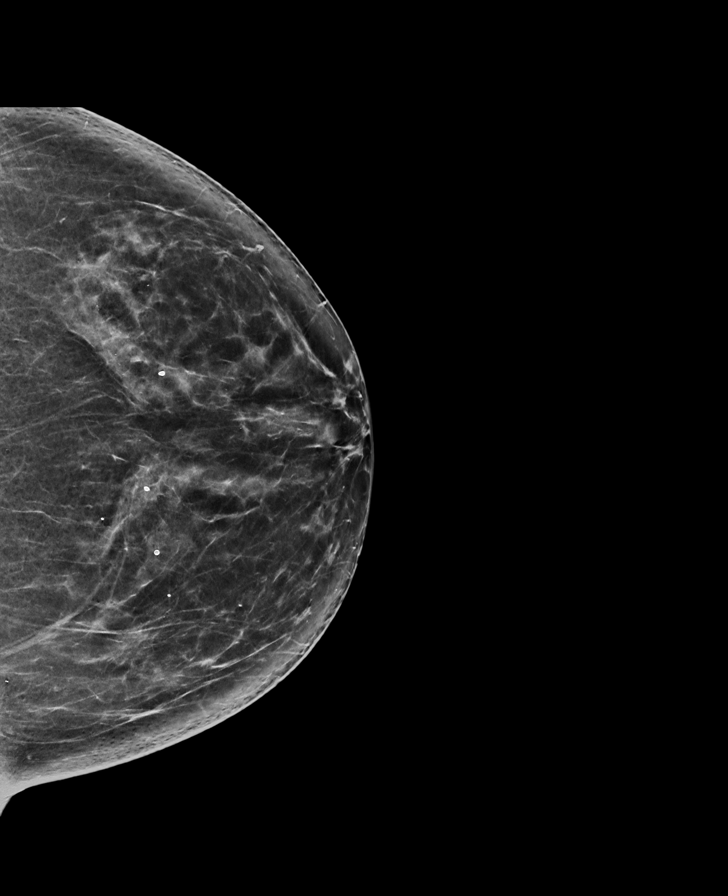

[R CC (2 of 2)]
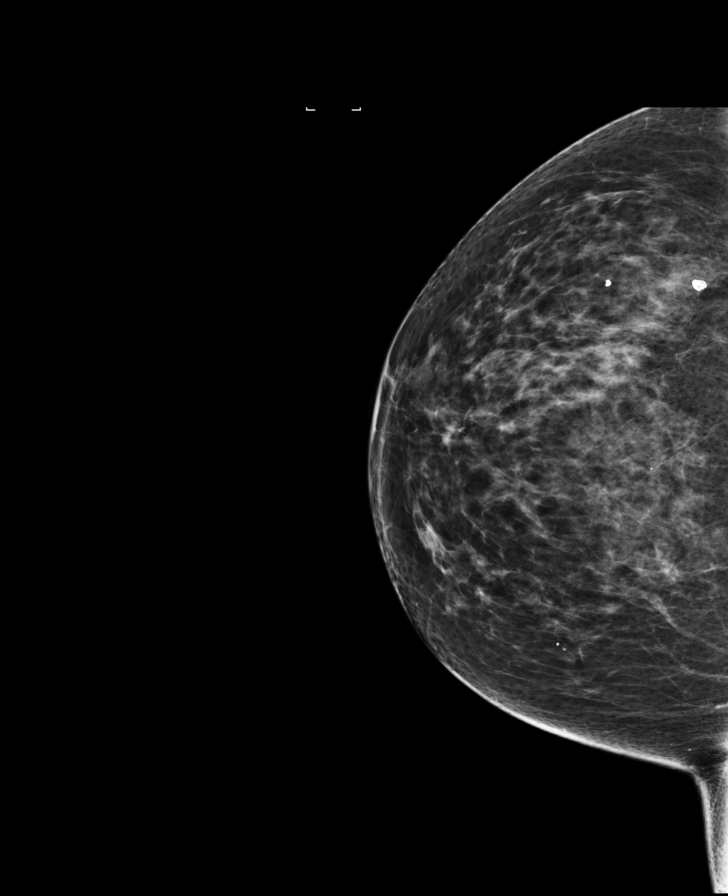

[L MLO synth-2D]
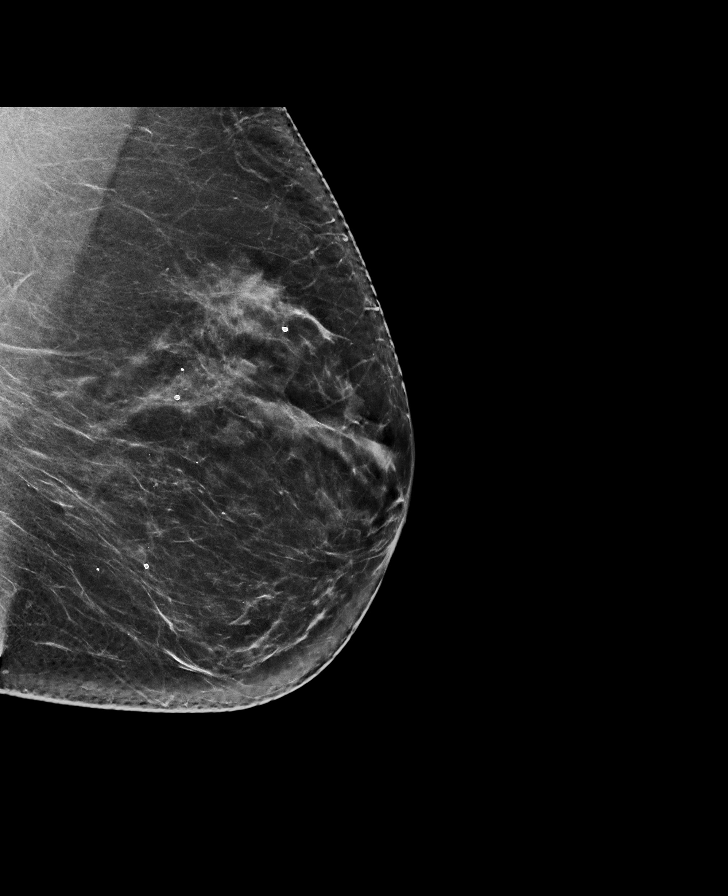

[R MLO synth-2D]
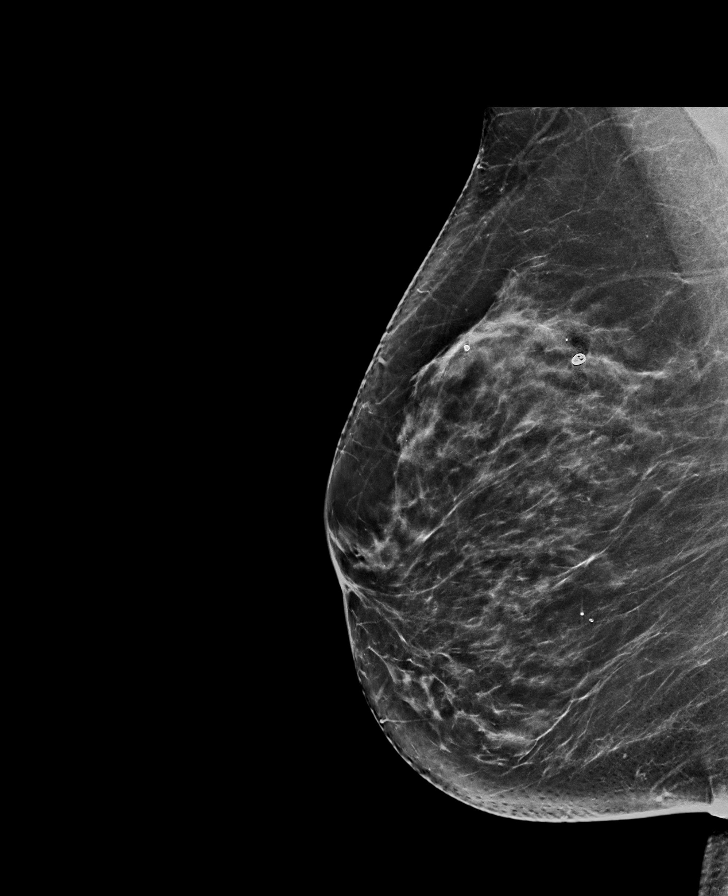

[R MLO]
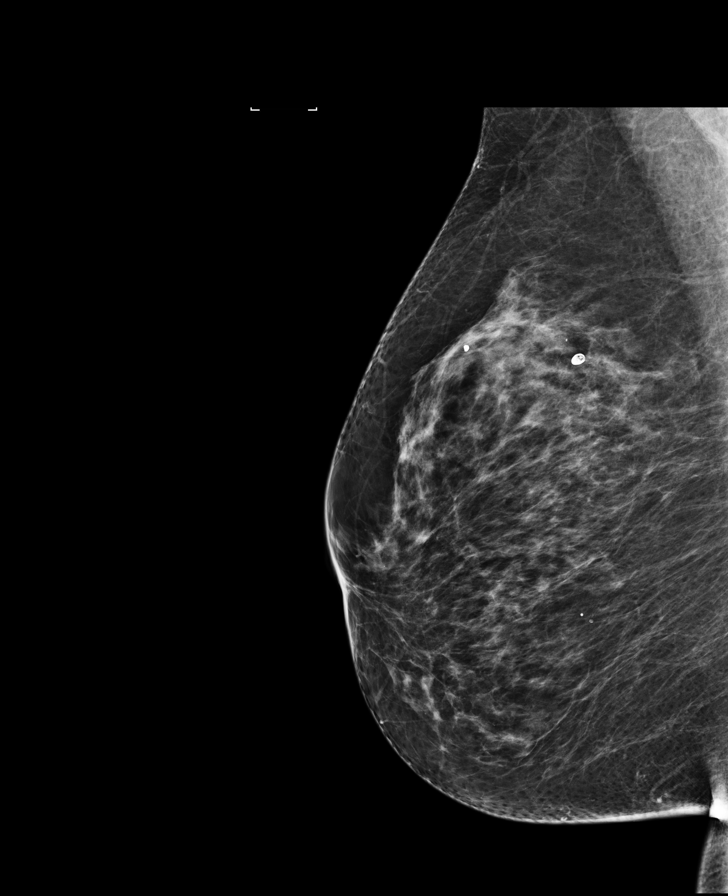

[R CC synth-2D]
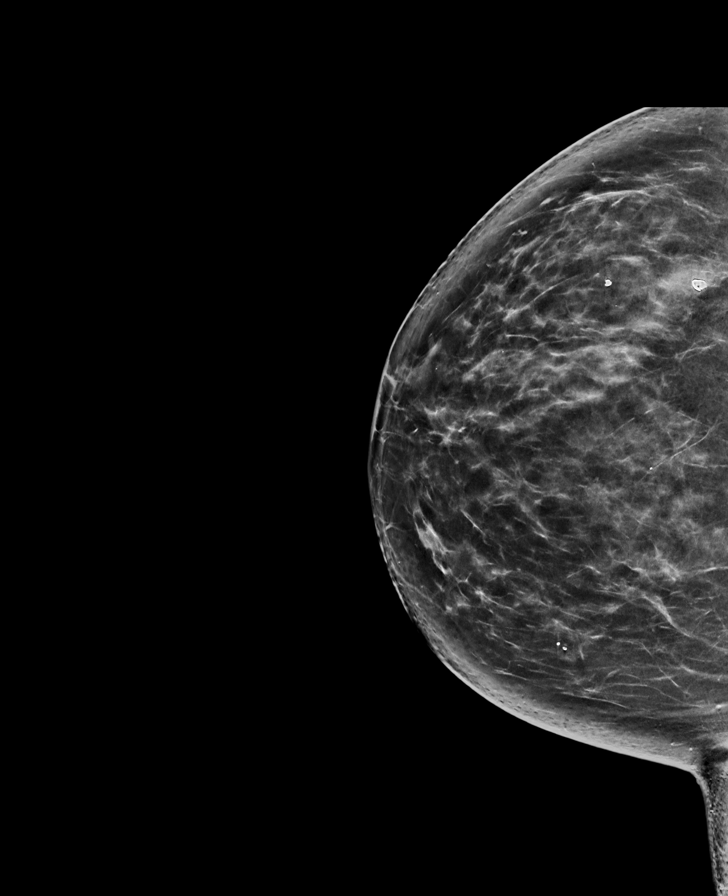

[L MLO]
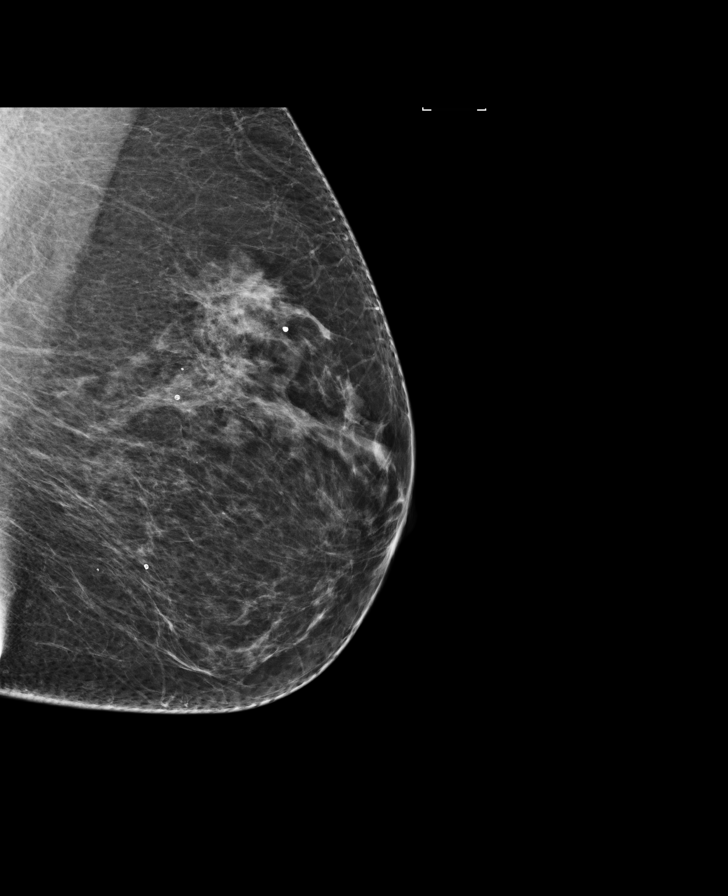

[8 of 29 positions shown; findings below may reference images not displayed]

ACR Breast Density Category c: The breast tissue is heterogeneously
dense, which may obscure small masses.
FINDINGS: There are no findings suspicious for malignancy. Images were
processed with CAD.
IMPRESSION: No mammographic evidence of malignancy. A result letter of this
screening mammogram will be mailed directly to the patient.

RECOMMENDATION:
Screening mammogram in one year. (Code:[TA])

BI-RADS CATEGORY  1: Negative.

## 2017-04-01 ENCOUNTER — Encounter: Payer: Self-pay | Admitting: Gynecology

## 2017-04-08 ENCOUNTER — Encounter: Payer: Medicare Other | Admitting: Gynecology

## 2017-04-15 ENCOUNTER — Ambulatory Visit (INDEPENDENT_AMBULATORY_CARE_PROVIDER_SITE_OTHER): Payer: Medicare Other | Admitting: Gynecology

## 2017-04-15 ENCOUNTER — Encounter: Payer: Self-pay | Admitting: Gynecology

## 2017-04-15 VITALS — BP 132/70 | Ht 63.0 in | Wt 139.0 lb

## 2017-04-15 DIAGNOSIS — Z01419 Encounter for gynecological examination (general) (routine) without abnormal findings: Secondary | ICD-10-CM | POA: Diagnosis not present

## 2017-04-15 DIAGNOSIS — M8580 Other specified disorders of bone density and structure, unspecified site: Secondary | ICD-10-CM

## 2017-04-15 DIAGNOSIS — M8589 Other specified disorders of bone density and structure, multiple sites: Secondary | ICD-10-CM

## 2017-04-15 MED ORDER — ESTRADIOL 0.05 MG/24HR TD PTTW: 1.0000 | MEDICATED_PATCH | TRANSDERMAL | 12 refills | Status: DC

## 2017-04-15 NOTE — Progress Notes (Signed)
Monica Ochoa 1946/10/09 [id number]   History:    71 y.o.  for annual gyn exam with complaining of worsening hot flashes at night and during the daytime since she stopped the transdermal estrogen last year. She would like to return back on the estrogen patch.Patient's past medical history as follows:  First pregnancy: Fourth degree tear Second pregnancy vaginal delivery Same year as a vaginal birth patient had TVH along with A&P repair and perineoplasty in [place] by a retired OB/GYN. In 2012 at [facility] and anal sphincteroplasty with perineorrhaphy secondary to fecal incontinence and external anal sphincter defect.  Patient had a normal colonoscopy in 2012. Her bone density this year indicated that the lowest T score was -2.2 at the right femoral neck with a normal Frax analysis. Patient with no past history of any abnormal Pap smear. Her PCP is been doing her blood work and her vaccines are up-to-date with the exception of the shingles vaccine.  Past medical history,surgical history, family history and social history were all reviewed and documented in the EPIC chart.  Gynecologic History No LMP recorded. Patient has had a hysterectomy. Contraception: status post hysterectomy Last Pap: 2011. Results were: normal Last mammogram: 2018. Results were: normal  Obstetric History OB History  Gravida Para Term Preterm AB Living  3 2 2   1 2   SAB TAB Ectopic Multiple Live Births  1       2    # Outcome Date GA Lbr Len/2nd Weight Sex Delivery Anes PTL Lv  3 SAB           2 Term     F Vag-Spont  N LIV  1 Term     M Vag-Spont  N LIV       ROS: A ROS was performed and pertinent positives and negatives are included in the history.  GENERAL: No fevers or chills. HEENT: No change in vision, no earache, sore throat or sinus congestion. NECK: No pain or stiffness. CARDIOVASCULAR: No chest pain or pressure. No palpitations. PULMONARY: No shortness of breath,  cough or wheeze. GASTROINTESTINAL: No abdominal pain, nausea, vomiting or diarrhea, melena or bright red blood per rectum. GENITOURINARY: No urinary frequency, urgency, hesitancy or dysuria. MUSCULOSKELETAL: No joint or muscle pain, no back pain, no recent trauma. DERMATOLOGIC: No rash, no itching, no lesions. ENDOCRINE: No polyuria, polydipsia, no heat or cold intolerance. No recent change in weight. HEMATOLOGICAL: No anemia or easy bruising or bleeding. NEUROLOGIC: No headache, seizures, numbness, tingling or weakness. PSYCHIATRIC: No depression, no loss of interest in normal activity or change in sleep pattern.     Exam: chaperone present  BP 132/70   Ht 5\' 3"  (1.6 m)   Wt 139 lb (63 kg)   BMI 24.62 kg/m   Body mass index is 24.62 kg/m.  General appearance : Well developed well nourished female. No acute distress HEENT: Eyes: no retinal hemorrhage or exudates,  Neck supple, trachea midline, no carotid bruits, no thyroidmegaly Lungs: Clear to auscultation, no rhonchi or wheezes, or rib retractions  Heart: Regular rate and rhythm, no murmurs or gallops Breast:Examined in sitting and supine position were symmetrical in appearance, no palpable masses or tenderness,  no skin retraction, no nipple inversion, no nipple discharge, no skin discoloration, no axillary or supraclavicular lymphadenopathy Abdomen: no palpable masses or tenderness, no rebound or guarding Extremities: no edema or skin discoloration or tenderness  Pelvic:  Bartholin, Urethra, Skene Glands: Within normal limits  Vagina: No gross lesions or discharge, first-degree cystocele  Cervix: Absent  Uterus  absent  Adnexa  Without masses or tenderness  Anus and perineum  normal   Rectovaginal  normal sphincter tone without palpated masses or tenderness             Hemoccult PCP provides     Assessment/Plan:  71 y.o. female for annual exam will like to return back on the low dose Vivelle-Dot 0.05 mg twice a week.  She is fully aware of the risk at her age continue on low-dose aspirin replacement therapy to include the risk of DVT, pulmonary embolism and breast cancer. She fully understands and accepts these risks. Her PCP has been doing her blood work. Pap smear no longer indicated. We discussed importance of calcium vitamin D and weightbearing exercises for osteoporosis prevention.   Terrance Mass MD, 2:56 PM 04/15/2017

## 2017-05-15 DIAGNOSIS — R7309 Other abnormal glucose: Secondary | ICD-10-CM | POA: Diagnosis not present

## 2017-05-15 DIAGNOSIS — E785 Hyperlipidemia, unspecified: Secondary | ICD-10-CM | POA: Diagnosis not present

## 2017-05-15 DIAGNOSIS — J301 Allergic rhinitis due to pollen: Secondary | ICD-10-CM | POA: Diagnosis not present

## 2017-05-15 DIAGNOSIS — F419 Anxiety disorder, unspecified: Secondary | ICD-10-CM | POA: Diagnosis not present

## 2017-06-01 DIAGNOSIS — R7303 Prediabetes: Secondary | ICD-10-CM | POA: Diagnosis not present

## 2017-06-01 DIAGNOSIS — E785 Hyperlipidemia, unspecified: Secondary | ICD-10-CM | POA: Diagnosis not present

## 2017-06-05 DIAGNOSIS — Z1211 Encounter for screening for malignant neoplasm of colon: Secondary | ICD-10-CM | POA: Diagnosis not present

## 2017-06-05 DIAGNOSIS — Z23 Encounter for immunization: Secondary | ICD-10-CM | POA: Diagnosis not present

## 2017-06-05 DIAGNOSIS — Z1389 Encounter for screening for other disorder: Secondary | ICD-10-CM | POA: Diagnosis not present

## 2017-06-05 DIAGNOSIS — Z0000 Encounter for general adult medical examination without abnormal findings: Secondary | ICD-10-CM | POA: Diagnosis not present

## 2017-06-05 DIAGNOSIS — F411 Generalized anxiety disorder: Secondary | ICD-10-CM | POA: Diagnosis not present

## 2017-06-05 DIAGNOSIS — Z1159 Encounter for screening for other viral diseases: Secondary | ICD-10-CM | POA: Diagnosis not present

## 2017-06-05 DIAGNOSIS — M8588 Other specified disorders of bone density and structure, other site: Secondary | ICD-10-CM | POA: Diagnosis not present

## 2017-06-05 DIAGNOSIS — Z79899 Other long term (current) drug therapy: Secondary | ICD-10-CM | POA: Diagnosis not present

## 2017-06-05 DIAGNOSIS — J301 Allergic rhinitis due to pollen: Secondary | ICD-10-CM | POA: Diagnosis not present

## 2017-06-05 DIAGNOSIS — G4700 Insomnia, unspecified: Secondary | ICD-10-CM | POA: Diagnosis not present

## 2017-06-05 DIAGNOSIS — Z1231 Encounter for screening mammogram for malignant neoplasm of breast: Secondary | ICD-10-CM | POA: Diagnosis not present

## 2017-06-05 DIAGNOSIS — E785 Hyperlipidemia, unspecified: Secondary | ICD-10-CM | POA: Diagnosis not present

## 2017-08-10 DIAGNOSIS — E784 Other hyperlipidemia: Secondary | ICD-10-CM | POA: Diagnosis not present

## 2017-08-10 DIAGNOSIS — Z0000 Encounter for general adult medical examination without abnormal findings: Secondary | ICD-10-CM | POA: Diagnosis not present

## 2017-08-12 DIAGNOSIS — E785 Hyperlipidemia, unspecified: Secondary | ICD-10-CM | POA: Diagnosis not present

## 2017-08-14 DIAGNOSIS — Z23 Encounter for immunization: Secondary | ICD-10-CM | POA: Diagnosis not present

## 2017-09-30 ENCOUNTER — Ambulatory Visit (INDEPENDENT_AMBULATORY_CARE_PROVIDER_SITE_OTHER): Payer: Medicare Other | Admitting: Gynecology

## 2017-09-30 ENCOUNTER — Encounter: Payer: Self-pay | Admitting: Gynecology

## 2017-09-30 VITALS — BP 118/70

## 2017-09-30 DIAGNOSIS — N8111 Cystocele, midline: Secondary | ICD-10-CM | POA: Diagnosis not present

## 2017-09-30 DIAGNOSIS — R102 Pelvic and perineal pain: Secondary | ICD-10-CM

## 2017-09-30 NOTE — Patient Instructions (Signed)
Call after the holidays if the bladder continues to be an issue and we will make an appointment to see the urologist.

## 2017-09-30 NOTE — Addendum Note (Signed)
Addended by: Nelva Nay on: 09/30/2017 12:45 PM   Modules accepted: Orders

## 2017-09-30 NOTE — Progress Notes (Signed)
    Monica Ochoa [birth year] [id number]        71 y.o.  [id number] former patient of Dr. Toney Rakes who presents with a several week history of feeling pressure at her vagina.  Noted after heavy lifting.  Symptoms come and go particularly with straining or lifting.  No urinary symptoms such as frequency dysuria urgency low back pain fever or chills.  No nausea vomiting diarrhea constipation.  History of TVH anterior and posterior colporrhaphy 1977 for uterine prolapse after childbirth.  An annual exam this past year Dr. Toney Rakes noted a first-degree cystocele.  Past medical history,surgical history, problem list, medications, allergies, family history and social history were all reviewed and documented in the EPIC chart.  Directed ROS with pertinent positives and negatives documented in the history of present illness/assessment and plan.  Exam: [name] assistant Vitals:   09/30/17 1023  BP: 118/70   General appearance:  Normal Abdomen soft nontender without masses guarding rebound Pelvic external BUS vagina with atrophic changes.  First to second-degree cystocele noted to within 2 fingerbreadths of the introitus.  Cuff well supported.  No significant rectocele.  Bimanual without masses or tenderness.  Rectal exam confirms.  Assessment/Plan:  71 y.o. [id number] with cystocele at first to second-degree which I think are causing her problems.  Became symptomatic after heavy lifting.  Reviewed options to include observation for now with avoidance of lifting for the next month or 2 and then reassessing symptoms.  Alternatives to include trial of pessary as well as repeat surgery with cystocele repair discussed.  With past history of anterior colporrhaphy risks of surgery reviewed and possible use of mesh also discussed.  At this point the patient prefers monitoring her symptoms over the next month or 2 with limiting heavy lifting.  If she continues to have significant symptoms she will call and we  will refer to urology for evaluation for cystocele repair.  Urine analysis today shows few bacteria but otherwise unremarkable.  Will follow up with culture and treat if positive.  Pictures were used with our discussion of the cystocele and the anatomy of the issue.  Greater than 50% of my time was spent in direct face to face counseling and coordination of care with the patient.     [name] MD, 10:45 AM 09/30/2017

## 2017-10-03 LAB — URINALYSIS W MICROSCOPIC + REFLEX CULTURE
Bilirubin Urine: NEGATIVE
GLUCOSE, UA: NEGATIVE
HYALINE CAST: NONE SEEN /LPF
Ketones, ur: NEGATIVE
Nitrites, Initial: NEGATIVE
Protein, ur: NEGATIVE
Specific Gravity, Urine: 1.015 (ref 1.001–1.03)
pH: 7 (ref 5.0–8.0)

## 2017-10-03 LAB — CULTURE INDICATED

## 2017-10-03 LAB — URINE CULTURE
MICRO NUMBER:: 81289418
SPECIMEN QUALITY:: ADEQUATE

## 2017-10-05 ENCOUNTER — Other Ambulatory Visit: Payer: Self-pay | Admitting: Gynecology

## 2017-10-05 MED ORDER — CIPROFLOXACIN HCL 250 MG PO TABS
250.0000 mg | ORAL_TABLET | Freq: Two times a day (BID) | ORAL | 0 refills | Status: DC
Start: 2017-10-05 — End: 2017-10-06

## 2017-10-06 ENCOUNTER — Telehealth: Payer: Self-pay | Admitting: *Deleted

## 2017-10-06 MED ORDER — SULFAMETHOXAZOLE-TRIMETHOPRIM 800-160 MG PO TABS: 1.0000 | ORAL_TABLET | Freq: Two times a day (BID) | ORAL | 0 refills | Status: DC

## 2017-10-06 NOTE — Telephone Encounter (Signed)
Pt was prescribed Cipro 250 mg tablet bid x 3 days on 10/05/17. Pt said after reading the side effects she is very nervous to take medication 1 side effect that made her nervous was about the tendons. Pt said she already has joint issues. Pt would like to know your thoughts. Please advise

## 2017-10-06 NOTE — Telephone Encounter (Signed)
Every medication will list a variety of side effects and risks.  Unlikely with a short course and a low dose with ciprofloxacin.  If she feels uncomfortable with this alternative would be Septra DS 1 p.o. twice daily times 3 days.  Her choice

## 2017-10-06 NOTE — Telephone Encounter (Signed)
Pt would like to try septra, Rx sent.

## 2017-11-26 ENCOUNTER — Telehealth: Payer: Self-pay | Admitting: *Deleted

## 2017-11-26 NOTE — Telephone Encounter (Signed)
Patient was told to call if her bladder issues continued, states you told her name of urologist to refer at [facility]. Pt would like to be referred. Please advise

## 2017-11-26 NOTE — Telephone Encounter (Signed)
Dr. Bernerd Limbo

## 2017-11-27 NOTE — Telephone Encounter (Signed)
Referral faxed they will call pt to scheduled, I also gave pt # to call if she doesn't not hear from them

## 2017-12-07 NOTE — Telephone Encounter (Signed)
Patient scheduled on 01/07/18 at [facility], she said you mentioned a doctor with [facility] on [address]. I believe patient is referring to Dr.Catherine Zigmund Daniel. She wanted to see if she can get in sooner to see her and "get the ball rolling". Patient would like to see her. Please advise

## 2017-12-07 NOTE — Telephone Encounter (Signed)
Dr.Matthews office is not open today due to [facility] holiday, I will call tomorrow [phone] and let pt know time and date.

## 2017-12-07 NOTE — Telephone Encounter (Signed)
Okay for appointment to see Dr. [place] Pink if she prefers

## 2017-12-08 DIAGNOSIS — R351 Nocturia: Secondary | ICD-10-CM | POA: Diagnosis not present

## 2017-12-08 DIAGNOSIS — R350 Frequency of micturition: Secondary | ICD-10-CM | POA: Diagnosis not present

## 2017-12-08 DIAGNOSIS — N3946 Mixed incontinence: Secondary | ICD-10-CM | POA: Diagnosis not present

## 2017-12-08 NOTE — Telephone Encounter (Signed)
Pt called stating she had appointment with [facility] today. She doesn't what the referral with Dr.Matthews.

## 2017-12-10 DIAGNOSIS — B351 Tinea unguium: Secondary | ICD-10-CM | POA: Diagnosis not present

## 2017-12-10 DIAGNOSIS — L814 Other melanin hyperpigmentation: Secondary | ICD-10-CM | POA: Diagnosis not present

## 2017-12-10 DIAGNOSIS — L821 Other seborrheic keratosis: Secondary | ICD-10-CM | POA: Diagnosis not present

## 2017-12-10 DIAGNOSIS — D1801 Hemangioma of skin and subcutaneous tissue: Secondary | ICD-10-CM | POA: Diagnosis not present

## 2017-12-15 DIAGNOSIS — R351 Nocturia: Secondary | ICD-10-CM | POA: Diagnosis not present

## 2017-12-15 DIAGNOSIS — N3946 Mixed incontinence: Secondary | ICD-10-CM | POA: Diagnosis not present

## 2017-12-22 DIAGNOSIS — N8111 Cystocele, midline: Secondary | ICD-10-CM | POA: Diagnosis not present

## 2017-12-30 DIAGNOSIS — J320 Chronic maxillary sinusitis: Secondary | ICD-10-CM | POA: Diagnosis not present

## 2017-12-30 DIAGNOSIS — J322 Chronic ethmoidal sinusitis: Secondary | ICD-10-CM | POA: Diagnosis not present

## 2017-12-30 DIAGNOSIS — J040 Acute laryngitis: Secondary | ICD-10-CM | POA: Diagnosis not present

## 2017-12-30 DIAGNOSIS — H8143 Vertigo of central origin, bilateral: Secondary | ICD-10-CM | POA: Diagnosis not present

## 2018-01-12 DIAGNOSIS — J320 Chronic maxillary sinusitis: Secondary | ICD-10-CM | POA: Diagnosis not present

## 2018-01-12 DIAGNOSIS — J321 Chronic frontal sinusitis: Secondary | ICD-10-CM | POA: Diagnosis not present

## 2018-01-12 DIAGNOSIS — J322 Chronic ethmoidal sinusitis: Secondary | ICD-10-CM | POA: Diagnosis not present

## 2018-01-12 DIAGNOSIS — J040 Acute laryngitis: Secondary | ICD-10-CM | POA: Diagnosis not present

## 2018-01-12 DIAGNOSIS — J3089 Other allergic rhinitis: Secondary | ICD-10-CM | POA: Diagnosis not present

## 2018-01-26 DIAGNOSIS — J321 Chronic frontal sinusitis: Secondary | ICD-10-CM | POA: Diagnosis not present

## 2018-01-26 DIAGNOSIS — J302 Other seasonal allergic rhinitis: Secondary | ICD-10-CM | POA: Diagnosis not present

## 2018-01-26 DIAGNOSIS — J301 Allergic rhinitis due to pollen: Secondary | ICD-10-CM | POA: Diagnosis not present

## 2018-01-26 DIAGNOSIS — J322 Chronic ethmoidal sinusitis: Secondary | ICD-10-CM | POA: Diagnosis not present

## 2018-02-03 ENCOUNTER — Other Ambulatory Visit: Payer: Self-pay | Admitting: Urology

## 2018-02-08 ENCOUNTER — Other Ambulatory Visit: Payer: Self-pay | Admitting: Obstetrics & Gynecology

## 2018-02-08 ENCOUNTER — Other Ambulatory Visit: Payer: Self-pay | Admitting: Gynecology

## 2018-02-08 DIAGNOSIS — Z1231 Encounter for screening mammogram for malignant neoplasm of breast: Secondary | ICD-10-CM

## 2018-03-01 ENCOUNTER — Ambulatory Visit
Admission: RE | Admit: 2018-03-01 | Discharge: 2018-03-01 | Disposition: A | Discharge status: Home / Self Care | Payer: Medicare Other | Source: Ambulatory Visit | Attending: Gynecology | Admitting: Gynecology

## 2018-03-01 DIAGNOSIS — Z1231 Encounter for screening mammogram for malignant neoplasm of breast: Secondary | ICD-10-CM

## 2018-03-01 IMAGING — MG DIGITAL SCREENING BILATERAL MAMMOGRAM WITH TOMO AND CAD
8 series · 8 of 24 positions shown · non-contrast
Comparison: Previous exam(s).

CLINICAL DATA: Screening.

EXAM:
DIGITAL SCREENING BILATERAL MAMMOGRAM WITH TOMO AND CAD

[L CC synth-2D]
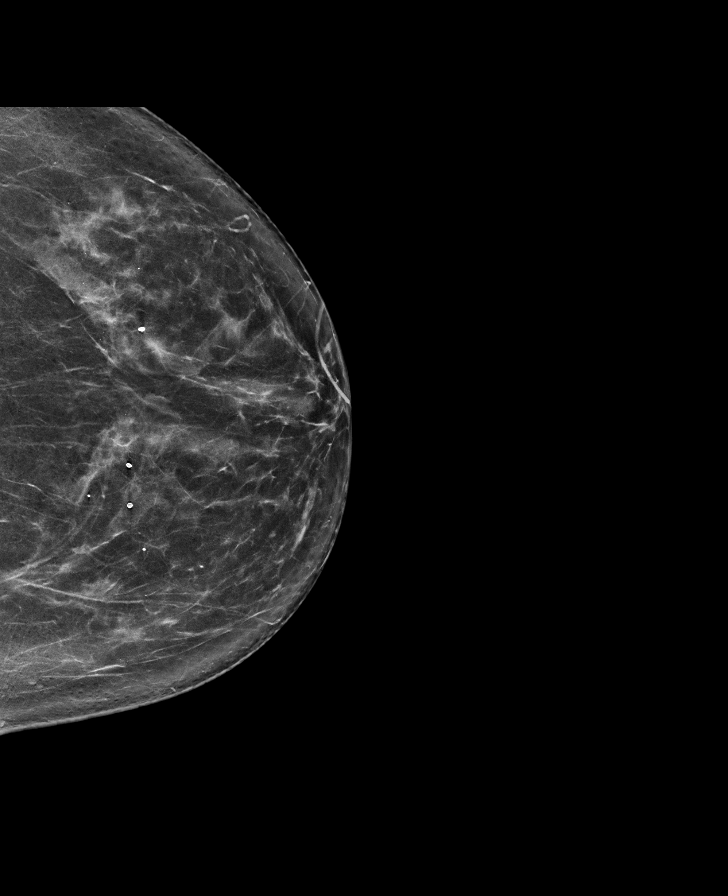

[L MLO synth-2D]
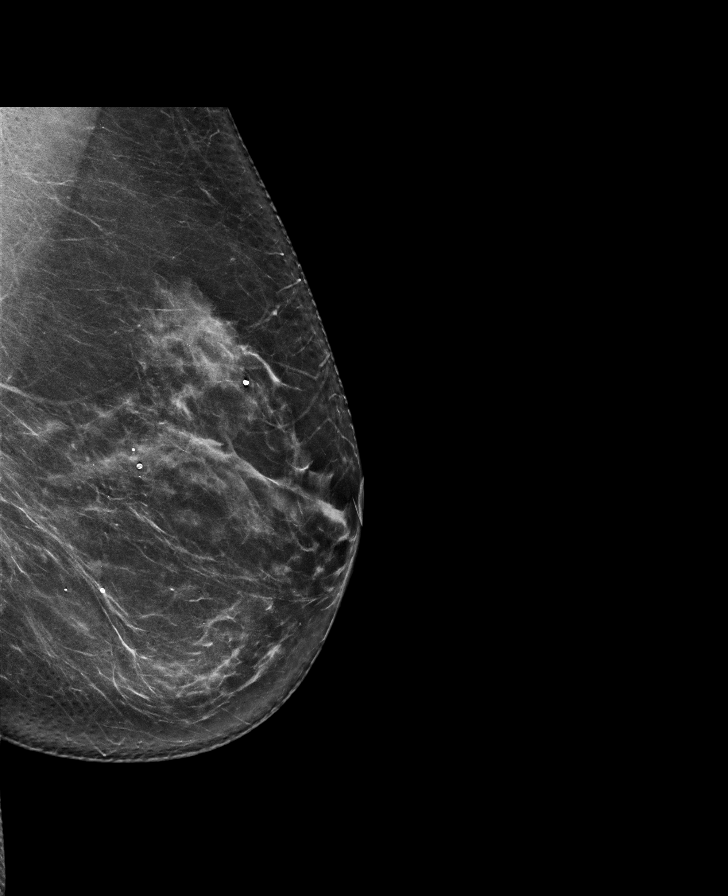

[R MLO synth-2D]
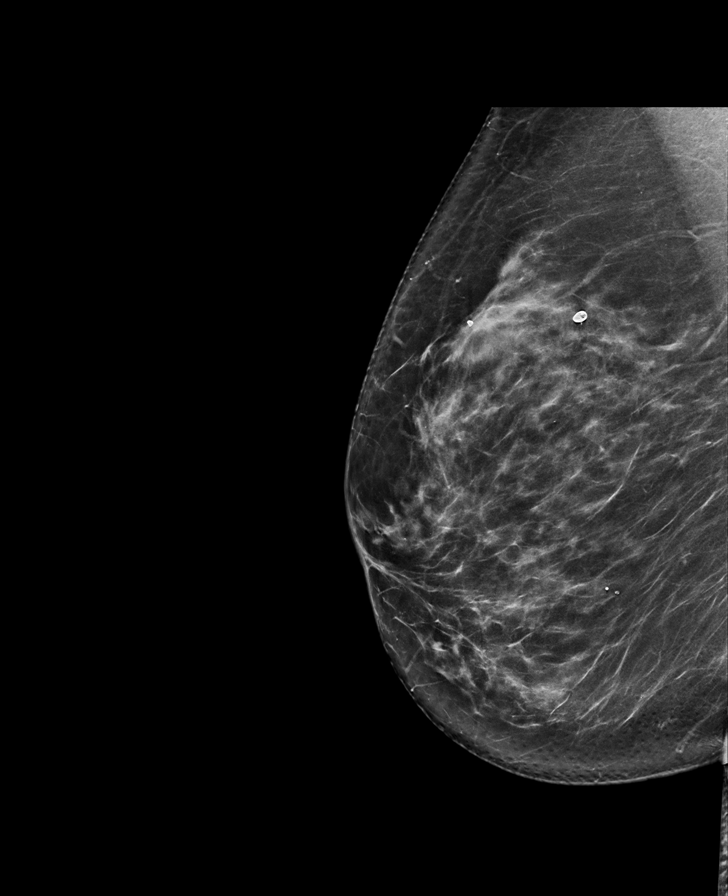

[R CC synth-2D]
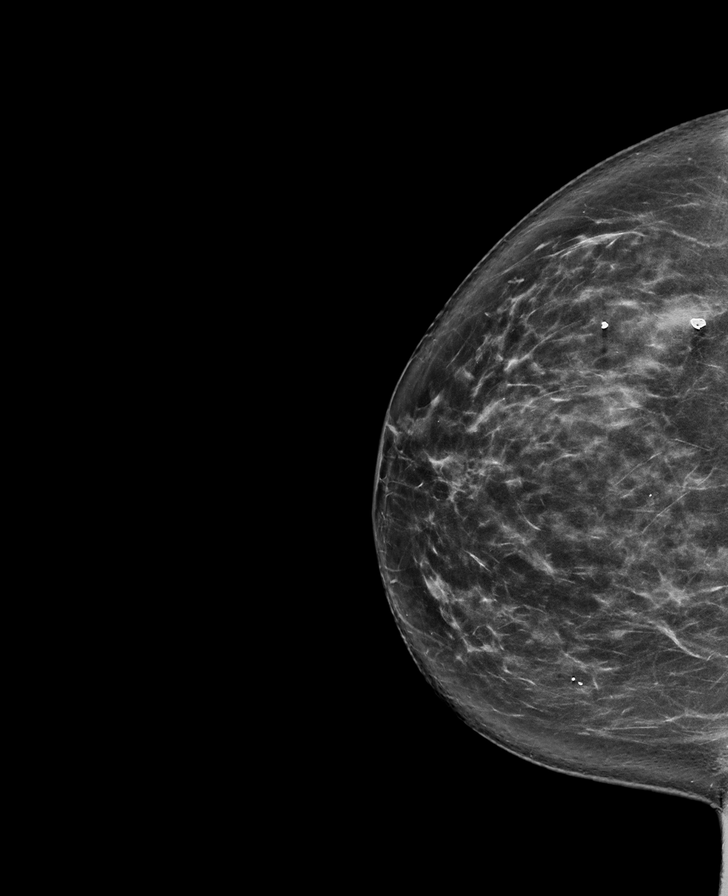

[R CC tomo · tomo slice 43/85.0]
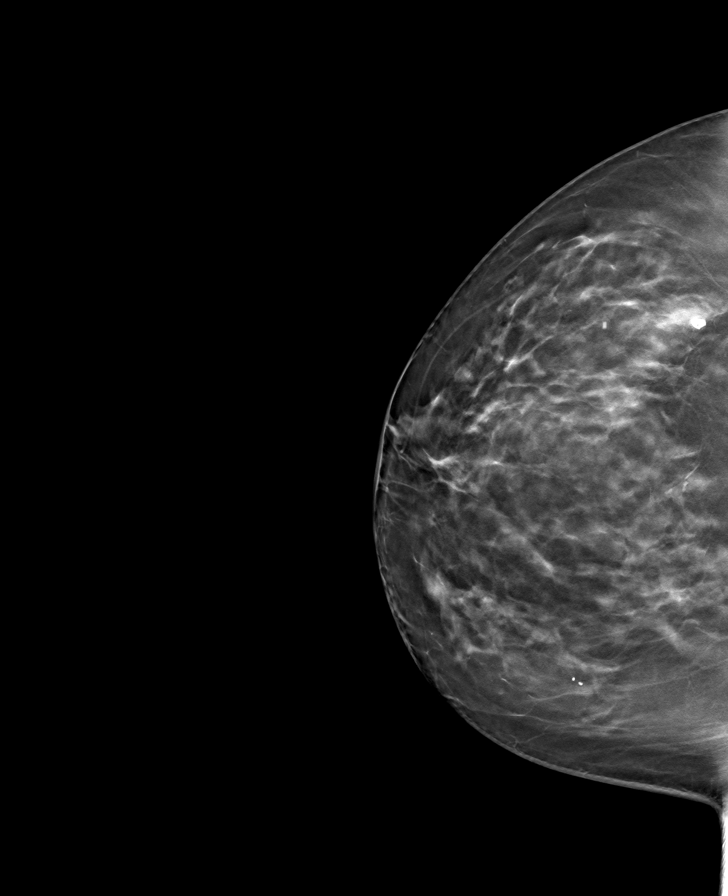

[L CC tomo · tomo slice 39/76.0]
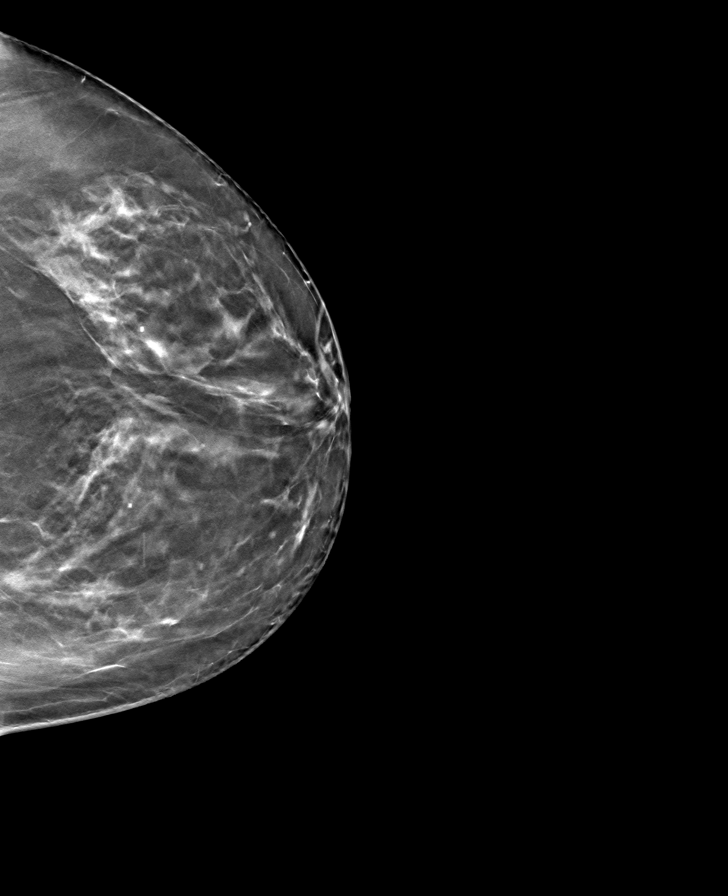

[R MLO tomo · tomo slice 43/84.0]
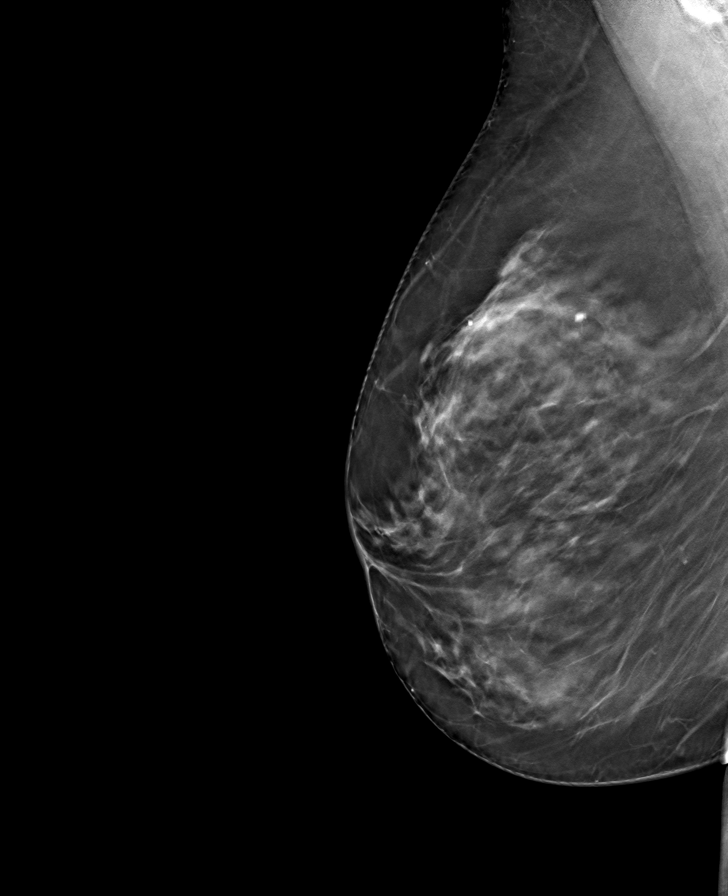

[L MLO tomo · tomo slice 41/81.0]
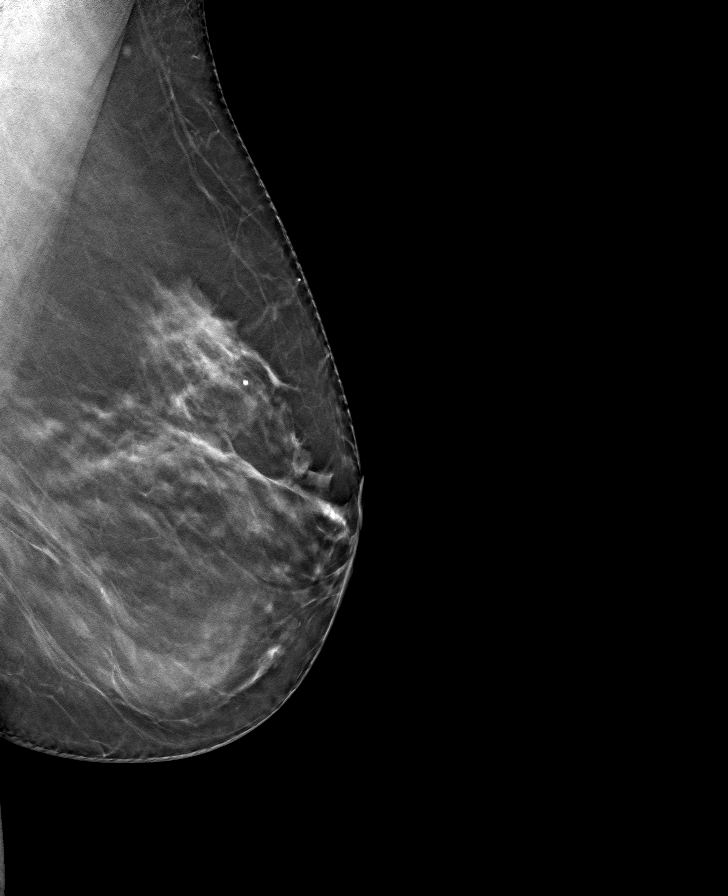

[8 of 24 positions shown; findings below may reference images not displayed]

ACR Breast Density Category b: There are scattered areas of
fibroglandular density.
FINDINGS: There are no findings suspicious for malignancy. Images were
processed with CAD.
IMPRESSION: No mammographic evidence of malignancy. A result letter of this
screening mammogram will be mailed directly to the patient.

RECOMMENDATION:
Screening mammogram in one year. (Code:[TQ])

BI-RADS CATEGORY  1: Negative.

## 2018-03-23 ENCOUNTER — Encounter (HOSPITAL_COMMUNITY): Payer: Self-pay

## 2018-03-23 ENCOUNTER — Encounter (HOSPITAL_COMMUNITY)
Admission: RE | Admit: 2018-03-23 | Discharge: 2018-03-23 | Disposition: A | Discharge status: Home / Self Care | Payer: Medicare Other | Source: Ambulatory Visit | Attending: Urology | Admitting: Urology

## 2018-03-23 ENCOUNTER — Other Ambulatory Visit: Payer: Self-pay

## 2018-03-23 DIAGNOSIS — N8110 Cystocele, unspecified: Secondary | ICD-10-CM | POA: Insufficient documentation

## 2018-03-23 DIAGNOSIS — Z01812 Encounter for preprocedural laboratory examination: Secondary | ICD-10-CM | POA: Diagnosis not present

## 2018-03-23 DIAGNOSIS — Z0183 Encounter for blood typing: Secondary | ICD-10-CM | POA: Diagnosis not present

## 2018-03-23 HISTORY — DX: Family history of other specified conditions: Z84.89

## 2018-03-23 HISTORY — DX: Palpitations: R00.2

## 2018-03-23 HISTORY — DX: Presence of spectacles and contact lenses: Z97.3

## 2018-03-23 LAB — CBC
HEMATOCRIT: 40.4 % (ref 36.0–46.0)
HEMOGLOBIN: 13.5 g/dL (ref 12.0–15.0)
MCH: 29.3 pg (ref 26.0–34.0)
MCHC: 33.4 g/dL (ref 30.0–36.0)
MCV: 87.8 fL (ref 78.0–100.0)
Platelets: 251 10*3/uL (ref 150–400)
RBC: 4.6 MIL/uL (ref 3.87–5.11)
RDW: 13.2 % (ref 11.5–15.5)
WBC: 6.1 10*3/uL (ref 4.0–10.5)

## 2018-03-23 LAB — ABO/RH: ABO/RH(D): B POS

## 2018-03-23 LAB — BASIC METABOLIC PANEL
ANION GAP: 11 (ref 5–15)
BUN: 14 mg/dL (ref 6–20)
CO2: 25 mmol/L (ref 22–32)
Calcium: 9.8 mg/dL (ref 8.9–10.3)
Chloride: 105 mmol/L (ref 101–111)
Creatinine, Ser: 0.87 mg/dL (ref 0.44–1.00)
Glucose, Bld: 112 mg/dL — ABNORMAL HIGH (ref 65–99)
POTASSIUM: 4.5 mmol/L (ref 3.5–5.1)
SODIUM: 141 mmol/L (ref 135–145)

## 2018-03-23 LAB — PROTIME-INR
INR: 0.94
Prothrombin Time: 12.5 seconds (ref 11.4–15.2)

## 2018-03-23 NOTE — Patient Instructions (Addendum)
Monica Ochoa  03/23/2018   Your procedure is scheduled on:  03-30-2018  Report to [facility] Main  Entrance  Report to admitting at  5:30 AM    Call this number if you have problems the morning of surgery [phone]   Remember: Do not eat food or drink liquids :After Midnight.     Take these medicines the morning of surgery with A SIP OF WATER:  Probiotic,  Xanax/ flonase if needed                               You may not have any metal on your body including hair pins and              piercings  Do not wear jewelry, make-up, lotions, powders or perfumes, deodorant             Do not wear nail polish.  Do not shave  48 hours prior to surgery.                 Do not bring valuables to the hospital. [place].  Contacts, dentures or bridgework may not be worn into surgery.  Leave suitcase in the car. After surgery it may be brought to your room.   Special Instructions: N/A              Please read over the following fact sheets you were given: _____________________________________________________________________             [facility] - Preparing for Surgery Before surgery, you can play an important role.  Because skin is not sterile, your skin needs to be as free of germs as possible.  You can reduce the number of germs on your skin by washing with CHG (chlorahexidine gluconate) soap before surgery.  CHG is an antiseptic cleaner which kills germs and bonds with the skin to continue killing germs even after washing. Please DO NOT use if you have an allergy to CHG or antibacterial soaps.  If your skin becomes reddened/irritated stop using the CHG and inform your nurse when you arrive at Short Stay. Do not shave (including legs and underarms) for at least 48 hours prior to the first CHG shower.  You may shave your face/neck. Please follow these instructions carefully:  1.  Shower with CHG Soap the night  before surgery and the  morning of Surgery.  2.  If you choose to wash your hair, wash your hair first as usual with your  normal  shampoo.  3.  After you shampoo, rinse your hair and body thoroughly to remove the  shampoo.                           4.  Use CHG as you would any other liquid soap.  You can apply chg directly  to the skin and wash                       Gently with a scrungie or clean washcloth.  5.  Apply the CHG Soap to your body ONLY FROM THE NECK DOWN.   Do not use on face/ open  Wound or open sores. Avoid contact with eyes, ears mouth and genitals (private parts).                       Wash face,  Genitals (private parts) with your normal soap.             6.  Wash thoroughly, paying special attention to the area where your surgery  will be performed.  7.  Thoroughly rinse your body with warm water from the neck down.  8.  DO NOT shower/wash with your normal soap after using and rinsing off  the CHG Soap.                9.  Pat yourself dry with a clean towel.            10.  Wear clean pajamas.            11.  Place clean sheets on your bed the night of your first shower and do not  sleep with pets. Day of Surgery : Do not apply any lotions/deodorants the morning of surgery.  Please wear clean clothes to the hospital/surgery center.  FAILURE TO FOLLOW THESE INSTRUCTIONS MAY RESULT IN THE CANCELLATION OF YOUR SURGERY PATIENT SIGNATURE_________________________________  NURSE SIGNATURE__________________________________  ________________________________________________________________________

## 2018-03-26 NOTE — H&P (Signed)
I was consulted by the above provider to assess the patient's prolapse. In November she felt some vaginal pressure. She reduced at once. It is worse when she is straining. She has had a hysterectomy. She does not do any splinting maneuvers. She tends towards loose bowel movements   She is continent and voids every 2 or 3 hr and gets up once a night. She has had a slow flow for years and generally feels empty with minimal stopping and starting   She had a bladder suspension years ago with her hysterectomy. She has had low back surgery and has disc disease in her neck.   On pelvic examination she had a small grade 3 or large grade 2 cystocele with a modest central defect that almost reached the introitus. at rest her vaginal length may have been decreased to approximately 8 cm and descended to about 6 cm. With the cystocele reduced she had no stress incontinence and mild hypermobility of the bladder neck. She had no rectocele   the patient has minimal frequency and mild nocturia and mildly symptomatic cystocele. If she ever had surgery she would be consented for a possible transvaginal vault suspension but she would need a cystocele and likely a graft. A final decision on the cuff would be made intraoperatively.   Picture was drawn. Natural history discussed. It does affect her quality life and she like to proceed. She had a pessary years ago.   Today  Frequency and prolapse are stable  On urodynamics the patient was catheterized for 125 mL. The patient's bladder capacity was Eight hundred mL. She did not leak with or without the prolapse reduced with a pressure of 96 cm of water. During voiding she tried for many minutes. She could only generate a contraction of 5 cm of water not well sustained. She then went to the restroom in private and voided 800 mL. the bladder was hypo sensitive. Bladder neck descended 2 or 3 cm and fluoroscopically she had a moderate-sized cystocele noted. The details of the  urodynamics were signed in dictated. I did not have a good residual from her 1st visit.   A picture was drawn. I discussed the above surgery. Pessary and watchful waiting discussed.   Mesh issue described. The patient is intermittently sexually active. Natural history discussed again. She likely will call to proceed but may postpone things for awhile. Postoperative course described. Interestingly she described a bladder suspension years ago and had a suprapubic tube and then a Foley catheter and had trouble to empty. The risk of retention with surgery today described. Do Novo incontinence described and sequela      ALLERGIES: Codine No Allergies    MEDICATIONS: Aspirin 81 MG TABS Oral  Atorvastatin Calcium 10 mg tablet     GU PSH: Complex cystometrogram, w/ void pressure and urethral pressure profile studies, any technique - 12/15/2017 Complex Uroflow - 12/15/2017 Emg surf Electrd - 12/15/2017 Hysterectomy Unilat SO - 2013 Inject For cystogram - 12/15/2017 Intrabd voidng Press - 12/15/2017      PSH Notes: Back Surgery, Hysterectomy, Bladder tack   NON-GU PSH: No Non-GU PSH    GU PMH: Mixed incontinence - 12/08/2017 Nocturia - 12/08/2017 Urinary Frequency - 12/08/2017      PMH Notes:  1898-11-17 00:00:00 - Note: Normal Routine History And Physical Senior Citizen [phone])  2012-05-26 08:41:13 - Note: No Medical Problems   NON-GU PMH: Fecal urgency, Defecation urgency - 2014 Full incontinence of feces, Feces incontinence - 2014 Muscle weakness (  generalized), Muscle weakness - 2014 Other lack of coordination, Other lack of coordination - 2014 Anxiety Hypercholesterolemia    FAMILY HISTORY: Garment/textile technologist Workers' Pneumoconiosis - Father Death In The Family Father - [place] In Family Death In The Family Mother - Runs In [place] Status Number - Runs In Family nephrolithiasis - Sister   SOCIAL HISTORY: No Social History     Notes: Marital History - Currently Married, Never A  Smoker, Caffeine Use, Alcohol Use, Exercise Habits, Activities Of Daily Living, Self-reliant In Usual Daily Activities, Living Independently With Spouse   REVIEW OF SYSTEMS:    GU Review Female:   Patient denies frequent urination, hard to postpone urination, burning /pain with urination, get up at night to urinate, leakage of urine, stream starts and stops, trouble starting your stream, have to strain to urinate, and being pregnant.  Gastrointestinal (Upper):   Patient denies nausea, vomiting, and indigestion/ heartburn.  Gastrointestinal (Lower):   Patient denies diarrhea and constipation.  Constitutional:   Patient denies fever, night sweats, weight loss, and fatigue.  Skin:   Patient denies skin rash/ lesion and itching.  Eyes:   Patient denies blurred vision and double vision.  Ears/ Nose/ Throat:   Patient denies sore throat and sinus problems.  Hematologic/Lymphatic:   Patient denies swollen glands and easy bruising.  Cardiovascular:   Patient denies leg swelling and chest pains.  Respiratory:   Patient denies cough and shortness of breath.  Endocrine:   Patient denies excessive thirst.  Musculoskeletal:   Patient denies back pain and joint pain.  Neurological:   Patient denies headaches and dizziness.  Psychologic:   Patient denies depression and anxiety.   VITAL SIGNS: None   PAST DATA REVIEWED:  Source Of History:  Patient   PROCEDURES:          Urinalysis w/Scope Dipstick Dipstick Cont'd Micro  Color: Straw Bilirubin: Neg WBC/hpf: NS (Not Seen)  Appearance: Clear Ketones: Neg RBC/hpf: 0 - 2/hpf  Specific Gravity: 1.015 Blood: Trace Bacteria: NS (Not Seen)  pH: 6.0 Protein: Neg Cystals: NS (Not Seen)  Glucose: Neg Urobilinogen: 0.2 Casts: NS (Not Seen)    Nitrites: Neg Trichomonas: Not Present    Leukocyte Esterase: Neg Mucous: Not Present      Epithelial Cells: 0 - 5/hpf      Yeast: NS (Not Seen)      Sperm: Not Present    Notes: microscopic on unconentrated urine     ASSESSMENT:      ICD-10 Details  1 GU:   Cystocele, midline - N81.11               Notes:   I drew her a picture and we talked about prolapse surgery in detail. Pros, cons, general surgical and anesthetic risks, and other options including behavioral therapy, pessaries, and watchful waiting were discussed. She understands that prolapse repairs are successful in 80-85% of cases for prolapse symptoms and can recur anteriorly, posteriorly, and/or apically. She understands that in most cases I use a graft and general risks were discussed. Surgical risks were described but not limited to the discussion of injury to neighboring structures including the bowel (with possible life-threatening sepsis and colostomy), bladder, urethra, vagina (all resulting in further surgery), and ureter (resulting in re-implantation). We talked about injury to nerves/soft tissue leading to debilitating and intractable pelvic, abdominal, and lower extremity pain syndromes and neuropathies. The risks of buttock pain, intractable dyspareunia, and vaginal narrowing and shortening with sequelae were discussed. Bleeding risks,  transfusion rates, and infection were discussed. The risk of persistent, de novo, or worsening bladder and/or bowel incontinence/dysfunction was discussed. The need for CIC was described as well the usual post-operative course. The patient understands that she might not reach her treatment goal and that she might be worse following surgery.    PLAN:           Schedule Return Visit/Planned Activity: Return PRN - Office   After a thorough review of the management options for the patient's condition the patient  elected to proceed with surgical therapy as noted above. We have discussed the potential benefits and risks of the procedure, side effects of the proposed treatment, the likelihood of the patient achieving the goals of the procedure, and any potential problems that might occur during the procedure or  recuperation. Informed consent has been obtained.

## 2018-03-29 NOTE — Anesthesia Preprocedure Evaluation (Addendum)
Anesthesia Evaluation  Patient identified by MRN, date of birth, ID band Patient awake    Reviewed: Allergy & Precautions, NPO status , Patient's Chart, lab work & pertinent test results  Airway Mallampati: II  TM Distance: >3 FB Neck ROM: Full    Dental  (+) Caps, Teeth Intact   Pulmonary neg pulmonary ROS,    Pulmonary exam normal breath sounds clear to auscultation       Cardiovascular negative cardio ROS Normal cardiovascular exam Rhythm:Regular Rate:Normal     Neuro/Psych negative neurological ROS  negative psych ROS   GI/Hepatic negative GI ROS, Neg liver ROS,   Endo/Other  negative endocrine ROS  Renal/GU negative Renal ROS     Musculoskeletal negative musculoskeletal ROS (+)   Abdominal   Peds  Hematology negative hematology ROS (+)   Anesthesia Other Findings   Reproductive/Obstetrics negative OB ROS                            Anesthesia Physical Anesthesia Plan  ASA: II  Anesthesia Plan: General   Post-op Pain Management:    Induction: Intravenous  PONV Risk Score and Plan: 4 or greater and Ondansetron and Dexamethasone  Airway Management Planned: Oral ETT and LMA  Additional Equipment:   Intra-op Plan:   Post-operative Plan: Extubation in OR  Informed Consent: I have reviewed the patients History and Physical, chart, labs and discussed the procedure including the risks, benefits and alternatives for the proposed anesthesia with the patient or authorized representative who has indicated his/her understanding and acceptance.   Dental advisory given  Plan Discussed with: CRNA  Anesthesia Plan Comments:         Anesthesia Quick Evaluation

## 2018-03-30 ENCOUNTER — Encounter (HOSPITAL_COMMUNITY)
Admission: RE | Disposition: A | Discharge status: Home / Self Care | Payer: Self-pay | Source: Ambulatory Visit | Attending: Urology

## 2018-03-30 ENCOUNTER — Encounter (HOSPITAL_COMMUNITY): Payer: Self-pay

## 2018-03-30 ENCOUNTER — Ambulatory Visit (HOSPITAL_COMMUNITY): Payer: Medicare Other | Admitting: Anesthesiology

## 2018-03-30 ENCOUNTER — Ambulatory Visit (HOSPITAL_COMMUNITY)
Admission: RE | Admit: 2018-03-30 | Discharge: 2018-03-30 | Disposition: A | Discharge status: Home / Self Care | Payer: Medicare Other | Source: Ambulatory Visit | Attending: Urology | Admitting: Urology

## 2018-03-30 DIAGNOSIS — E785 Hyperlipidemia, unspecified: Secondary | ICD-10-CM | POA: Diagnosis not present

## 2018-03-30 DIAGNOSIS — M8580 Other specified disorders of bone density and structure, unspecified site: Secondary | ICD-10-CM | POA: Diagnosis not present

## 2018-03-30 DIAGNOSIS — Z885 Allergy status to narcotic agent status: Secondary | ICD-10-CM | POA: Insufficient documentation

## 2018-03-30 DIAGNOSIS — Z90710 Acquired absence of both cervix and uterus: Secondary | ICD-10-CM | POA: Insufficient documentation

## 2018-03-30 DIAGNOSIS — N8110 Cystocele, unspecified: Secondary | ICD-10-CM | POA: Diagnosis not present

## 2018-03-30 DIAGNOSIS — N8111 Cystocele, midline: Secondary | ICD-10-CM | POA: Diagnosis not present

## 2018-03-30 LAB — TYPE AND SCREEN
ABO/RH(D): B POS
ANTIBODY SCREEN: NEGATIVE

## 2018-03-30 SURGERY — EXAM UNDER ANESTHESIA
Anesthesia: General

## 2018-03-30 MED ORDER — LIDOCAINE-EPINEPHRINE (PF) 1 %-1:200000 IJ SOLN: INTRAMUSCULAR | Status: DC | PRN

## 2018-03-30 MED ORDER — OXYCODONE HCL 5 MG PO TABS: 5.0000 mg | ORAL_TABLET | Freq: Once | ORAL | Status: DC | PRN

## 2018-03-30 MED ORDER — SUGAMMADEX SODIUM 500 MG/5ML IV SOLN
INTRAVENOUS | Status: AC
  Filled 2018-03-30: qty 5

## 2018-03-30 MED ORDER — LIDOCAINE-EPINEPHRINE (PF) 1 %-1:200000 IJ SOLN
INTRAMUSCULAR | Status: AC
  Filled 2018-03-30: qty 60

## 2018-03-30 MED ORDER — PROPOFOL 10 MG/ML IV BOLUS
INTRAVENOUS | Status: DC | PRN
  Administered 2018-03-30: 160 mg via INTRAVENOUS

## 2018-03-30 MED ORDER — FENTANYL CITRATE (PF) 100 MCG/2ML IJ SOLN
INTRAMUSCULAR | Status: DC | PRN
  Administered 2018-03-30: 50 ug via INTRAVENOUS

## 2018-03-30 MED ORDER — FENTANYL CITRATE (PF) 100 MCG/2ML IJ SOLN: 25.0000 ug | INTRAMUSCULAR | Status: DC | PRN

## 2018-03-30 MED ORDER — ONDANSETRON HCL 4 MG/2ML IJ SOLN
INTRAMUSCULAR | Status: DC | PRN
  Administered 2018-03-30: 4 mg via INTRAVENOUS

## 2018-03-30 MED ORDER — PHENAZOPYRIDINE HCL 200 MG PO TABS
200.0000 mg | ORAL_TABLET | ORAL | Status: AC
  Administered 2018-03-30: 200 mg via ORAL
  Filled 2018-03-30: qty 1

## 2018-03-30 MED ORDER — SODIUM CHLORIDE 0.9 % IV SOLN: INTRAVENOUS | Status: DC | PRN

## 2018-03-30 MED ORDER — CLINDAMYCIN PHOSPHATE 2 % VA CREA
TOPICAL_CREAM | VAGINAL | Status: AC
  Filled 2018-03-30: qty 80

## 2018-03-30 MED ORDER — POLYMYXIN B SULFATE 500000 UNITS IJ SOLR
INTRAMUSCULAR | Status: AC
  Filled 2018-03-30: qty 500000

## 2018-03-30 MED ORDER — DEXAMETHASONE SODIUM PHOSPHATE 10 MG/ML IJ SOLN
INTRAMUSCULAR | Status: DC | PRN
  Administered 2018-03-30: 10 mg via INTRAVENOUS

## 2018-03-30 MED ORDER — FLUORESCEIN SODIUM 10 % IV SOLN
INTRAVENOUS | Status: AC
Start: 2018-03-30 — End: ?
  Filled 2018-03-30: qty 5

## 2018-03-30 MED ORDER — LIDOCAINE 2% (20 MG/ML) 5 ML SYRINGE
INTRAMUSCULAR | Status: AC
  Filled 2018-03-30: qty 5

## 2018-03-30 MED ORDER — PROPOFOL 10 MG/ML IV BOLUS
INTRAVENOUS | Status: AC
  Filled 2018-03-30: qty 20

## 2018-03-30 MED ORDER — CLINDAMYCIN PHOSPHATE 900 MG/50ML IV SOLN
900.0000 mg | INTRAVENOUS | Status: AC
  Administered 2018-03-30: 900 mg via INTRAVENOUS
  Filled 2018-03-30: qty 50

## 2018-03-30 MED ORDER — CIPROFLOXACIN IN D5W 400 MG/200ML IV SOLN
400.0000 mg | INTRAVENOUS | Status: AC
  Administered 2018-03-30: 400 mg via INTRAVENOUS
  Filled 2018-03-30: qty 200

## 2018-03-30 MED ORDER — SUCCINYLCHOLINE CHLORIDE 200 MG/10ML IV SOSY
PREFILLED_SYRINGE | INTRAVENOUS | Status: AC
  Filled 2018-03-30: qty 10

## 2018-03-30 MED ORDER — ROCURONIUM BROMIDE 10 MG/ML (PF) SYRINGE
PREFILLED_SYRINGE | INTRAVENOUS | Status: AC
Start: 2018-03-30 — End: ?
  Filled 2018-03-30: qty 5

## 2018-03-30 MED ORDER — TRAMADOL HCL 50 MG PO TABS: 50.0000 mg | ORAL_TABLET | Freq: Four times a day (QID) | ORAL | 0 refills | Status: DC | PRN

## 2018-03-30 MED ORDER — EPHEDRINE SULFATE-NACL 50-0.9 MG/10ML-% IV SOSY
PREFILLED_SYRINGE | INTRAVENOUS | Status: DC | PRN
  Administered 2018-03-30: 5 mg via INTRAVENOUS

## 2018-03-30 MED ORDER — EPHEDRINE 5 MG/ML INJ
INTRAVENOUS | Status: AC
  Filled 2018-03-30: qty 10

## 2018-03-30 MED ORDER — OXYCODONE HCL 5 MG/5ML PO SOLN
5.0000 mg | Freq: Once | ORAL | Status: DC | PRN
  Filled 2018-03-30: qty 5

## 2018-03-30 MED ORDER — LACTATED RINGERS IV SOLN
INTRAVENOUS | Status: DC | PRN
  Administered 2018-03-30: 07:00:00 via INTRAVENOUS

## 2018-03-30 MED ORDER — SUGAMMADEX SODIUM 500 MG/5ML IV SOLN
INTRAVENOUS | Status: DC | PRN
  Administered 2018-03-30: 300 mg via INTRAVENOUS

## 2018-03-30 MED ORDER — PROMETHAZINE HCL 25 MG/ML IJ SOLN: 6.2500 mg | INTRAMUSCULAR | Status: DC | PRN

## 2018-03-30 MED ORDER — DEXAMETHASONE SODIUM PHOSPHATE 10 MG/ML IJ SOLN
INTRAMUSCULAR | Status: AC
  Filled 2018-03-30: qty 1

## 2018-03-30 MED ORDER — MEPERIDINE HCL 50 MG/ML IJ SOLN: 6.2500 mg | INTRAMUSCULAR | Status: DC | PRN

## 2018-03-30 MED ORDER — FENTANYL CITRATE (PF) 100 MCG/2ML IJ SOLN
INTRAMUSCULAR | Status: AC
  Filled 2018-03-30: qty 2

## 2018-03-30 MED ORDER — LIDOCAINE 2% (20 MG/ML) 5 ML SYRINGE
INTRAMUSCULAR | Status: DC | PRN
  Administered 2018-03-30: 100 mg via INTRAVENOUS

## 2018-03-30 MED ORDER — ROCURONIUM BROMIDE 10 MG/ML (PF) SYRINGE
PREFILLED_SYRINGE | INTRAVENOUS | Status: DC | PRN
  Administered 2018-03-30: 50 mg via INTRAVENOUS

## 2018-03-30 MED ORDER — ONDANSETRON HCL 4 MG/2ML IJ SOLN
INTRAMUSCULAR | Status: AC
  Filled 2018-03-30: qty 2

## 2018-03-30 SURGICAL SUPPLY — 48 items
BAG URINE DRAINAGE (UROLOGICAL SUPPLIES) IMPLANT
BAG URO CATCHER STRL LF (MISCELLANEOUS) ×3 IMPLANT
BLADE SURG 15 STRL LF DISP TIS (BLADE) ×1 IMPLANT
BLADE SURG 15 STRL SS (BLADE) ×2
CATH FOLEY 2WAY SLVR  5CC 14FR (CATHETERS) ×2
CATH FOLEY 2WAY SLVR 5CC 14FR (CATHETERS) ×1 IMPLANT
CLOTH BEACON ORANGE TIMEOUT ST (SAFETY) ×3 IMPLANT
COVER FOOTSWITCH UNIV (MISCELLANEOUS) IMPLANT
COVER MAYO STAND STRL (DRAPES) IMPLANT
COVER SURGICAL LIGHT HANDLE (MISCELLANEOUS) ×3 IMPLANT
DEVICE CAPIO SLIM SINGLE (INSTRUMENTS) IMPLANT
DRAIN PENROSE 18X1/4 LTX STRL (WOUND CARE) ×3 IMPLANT
DRAPE SHEET LG 3/4 BI-LAMINATE (DRAPES) ×3 IMPLANT
ELECT PENCIL ROCKER SW 15FT (MISCELLANEOUS) ×3 IMPLANT
GAUZE 4X4 16PLY RFD (DISPOSABLE) ×6 IMPLANT
GAUZE PACKING 2X5 YD STRL (GAUZE/BANDAGES/DRESSINGS) ×3 IMPLANT
GLOVE BIO SURGEON STRL SZ 6.5 (GLOVE) ×2 IMPLANT
GLOVE BIO SURGEONS STRL SZ 6.5 (GLOVE) ×1
GLOVE BIOGEL M STRL SZ7.5 (GLOVE) ×3 IMPLANT
GLOVE ECLIPSE 8.5 STRL (GLOVE) ×3 IMPLANT
GOWN STRL REUS W/TWL XL LVL3 (GOWN DISPOSABLE) ×3 IMPLANT
HOLDER FOLEY CATH W/STRAP (MISCELLANEOUS) ×3 IMPLANT
IV NS 1000ML (IV SOLUTION) ×2
IV NS 1000ML BAXH (IV SOLUTION) ×1 IMPLANT
KIT BASIN OR (CUSTOM PROCEDURE TRAY) ×3 IMPLANT
NEEDLE HYPO 22GX1.5 SAFETY (NEEDLE) IMPLANT
NEEDLE MAYO 6 CRC TAPER PT (NEEDLE) ×3 IMPLANT
NS IRRIG 1000ML POUR BTL (IV SOLUTION) ×3 IMPLANT
PACK CYSTO (CUSTOM PROCEDURE TRAY) ×3 IMPLANT
PLUG CATH AND CAP STER (CATHETERS) ×3 IMPLANT
RETRACTOR STAY HOOK 5MM (MISCELLANEOUS) ×3 IMPLANT
SHEET LAVH (DRAPES) ×3 IMPLANT
SUT VIC AB 0 CT1 27 (SUTURE) ×2
SUT VIC AB 0 CT1 27XBRD ANTBC (SUTURE) ×1 IMPLANT
SUT VIC AB 2-0 CT1 27 (SUTURE) ×4
SUT VIC AB 2-0 CT1 27XBRD (SUTURE) ×2 IMPLANT
SUT VIC AB 2-0 SH 27 (SUTURE) ×4
SUT VIC AB 2-0 SH 27X BRD (SUTURE) ×2 IMPLANT
SUT VIC AB 3-0 SH 27 (SUTURE) ×4
SUT VIC AB 3-0 SH 27XBRD (SUTURE) ×2 IMPLANT
SUT VICRYL 0 UR6 27IN ABS (SUTURE) ×6 IMPLANT
SYR 10ML LL (SYRINGE) ×3 IMPLANT
TOWEL OR 17X26 10 PK STRL BLUE (TOWEL DISPOSABLE) ×3 IMPLANT
TOWEL OR NON WOVEN STRL DISP B (DISPOSABLE) ×3 IMPLANT
TUBING CONNECTING 10 (TUBING) ×2 IMPLANT
TUBING CONNECTING 10' (TUBING) ×1
WATER STERILE IRR 1000ML POUR (IV SOLUTION) ×3 IMPLANT
YANKAUER SUCT BULB TIP 10FT TU (MISCELLANEOUS) ×3 IMPLANT

## 2018-03-30 NOTE — Anesthesia Procedure Notes (Signed)
Procedure Name: Intubation Date/Time: 03/30/2018 7:35 AM Performed by: Lind Covert, CRNA Pre-anesthesia Checklist: Patient identified, Emergency Drugs available, Suction available, Patient being monitored and Timeout performed Patient Re-evaluated:Patient Re-evaluated prior to induction Oxygen Delivery Method: Circle system utilized Preoxygenation: Pre-oxygenation with 100% oxygen Induction Type: IV induction Ventilation: Mask ventilation without difficulty Laryngoscope Size: Mac and 3 Grade View: Grade II Tube type: Oral Tube size: 7.0 mm Number of attempts: 1 Airway Equipment and Method: Stylet Placement Confirmation: ETT inserted through vocal cords under direct vision,  positive ETCO2 and breath sounds checked- equal and bilateral Secured at: 22 cm Tube secured with: Tape Dental Injury: Teeth and Oropharynx as per pre-operative assessment

## 2018-03-30 NOTE — Transfer of Care (Signed)
Immediate Anesthesia Transfer of Care Note  Patient: Monica Ochoa  Procedure(s) Performed: ANTERIOR REPAIR (CYSTOCELE) (N/A ) VAGINAL VAULT SUSPENSION REPAIR WITH GRAFT (N/A ) CYSTOSCOPY (N/A )  Patient Location: PACU  Anesthesia Type:General  Level of Consciousness: sedated  Airway & Oxygen Therapy: Patient Spontanous Breathing and Patient connected to face mask oxygen  Post-op Assessment: Report given to RN and Post -op Vital signs reviewed and stable  Post vital signs: Reviewed and stable  Last Vitals:  Vitals Value Taken Time  BP 119/58 03/30/2018  8:39 AM  Temp    Pulse 66 03/30/2018  8:41 AM  Resp 16 03/30/2018  8:41 AM  SpO2 100 % 03/30/2018  8:41 AM  Vitals shown include unvalidated device data.  Last Pain:  Vitals:   03/30/18 0556  TempSrc:   PainSc: 0-No pain         Complications: No apparent anesthesia complications

## 2018-03-30 NOTE — Anesthesia Postprocedure Evaluation (Signed)
Anesthesia Post Note  Patient: Monica Ochoa  Procedure(s) Performed: EXAM UNDER ANESTHESIA (N/A )     Patient location during evaluation: PACU Anesthesia Type: General Level of consciousness: sedated and patient cooperative Pain management: pain level controlled Vital Signs Assessment: post-procedure vital signs reviewed and stable Respiratory status: spontaneous breathing Cardiovascular status: stable Anesthetic complications: no    Last Vitals:  Vitals:   03/30/18 0929 03/30/18 1020  BP: 120/65 134/74  Pulse: 73 77  Resp: 14 16  Temp: 36.6 C 36.6 C  SpO2: 100% 100%    Last Pain:  Vitals:   03/30/18 1020  TempSrc:   PainSc: 0-No pain                 Nolon Nations

## 2018-03-30 NOTE — Op Note (Signed)
Preoperative diagnosis: Mild vault prolapse and cystocele Postoperative diagnosis: Mild vault prolapse and cystocele Surgery: Examination under anesthesia Surgeon: Dr. Nicki Reaper Dillan Candela Assistant: Doran Clay  The assistant was present and necessary for all steps of the operation described. The assistant played a critical role assisting during the operation  The patient was prepped and draped in the usual fashion.  Preoperative antibiotics were given.  Extra care was taken with leg positioning to minimize the risk of compartment syndrome and neuropathy and deep vein thrombosis.  It was noted that she had some shortening of the vagina before I prepped her.  I decided to drape and prep the patient as usual.  I spent approximately 25 minutes doing an examination under anesthesia after insertion of a Foley catheter.  I did read my notes again prior to surgery.  I had noted some vaginal shortening but the vaginal shortening under anesthesia was greater.  Her vaginal length was approximately 5-1/2 cm.  Her cuff descended about 2 cm.  She had a little bit more length if you press the more mobile posterior cuff but it only increased total length by perhaps a centimeter.  The vaginal cuff dimples were easily visualized.  The length between the vaginal cuff dimples and the proximal urethra was about 4 cm.  She was also narrow with a narrow pubic arch and she is a petite lady.  She had a soft band of tissue at the level of the introitus at 6:00 limiting exposure some but I could still insert a short vaginal speculum with mild difficulty.  The true width of her pubocervical fascia arguably was narrower than the speculum and some of the width was actually pulling in on the vaginal sidewall.  I did not feel that she had enough defect and especially enough length to do an anterior repair of any substance.  I thought it would threaten her vaginal length and potentially cause some coning or narrowing at the cuff.  I did  not feel that attempts at a sacral colpopexy vaginally was prudent.  The cuff really did not come down very far relative to the shorter cuff at rest.  I felt that a robotic sacral colpopexy if the patient decides to have surgery was the procedure of choice.  This would not threaten her the length or narrow the vagina.  One could argue it may give her a little bit more length because of the findings at the posterior cuff.  I was also significantly concerned about vaginal access to do the surgery vaginally.  The patient was not feeling the prolapse recently and based upon the findings it does not surprise me.  I spoke to her husband afterwards and will speak to the patient as well.

## 2018-03-30 NOTE — Discharge Instructions (Signed)
I have reviewed discharge instructions in detail with the patient. They will follow-up with me or their physician as scheduled. My nurse will also be calling the patients as per protocol. As discussed with Dr. Matilde Sprang.

## 2018-03-30 NOTE — Interval H&P Note (Signed)
History and Physical Interval Note:  03/30/2018 7:08 AM  Monica Ochoa  has presented today for surgery, with the diagnosis of CYSTOCELE VAULT PROLAPSE  The various methods of treatment have been discussed with the patient and family. After consideration of risks, benefits and other options for treatment, the patient has consented to  Procedure(s): ANTERIOR REPAIR (CYSTOCELE) (N/A) VAGINAL VAULT SUSPENSION REPAIR WITH GRAFT (N/A) CYSTOSCOPY (N/A) as a surgical intervention .  The patient's history has been reviewed, patient examined, no change in status, stable for surgery.  I have reviewed the patient's chart and labs.  Questions were answered to the patient's satisfaction.     Khambrel Amsden A

## 2018-04-01 DIAGNOSIS — J310 Chronic rhinitis: Secondary | ICD-10-CM | POA: Diagnosis not present

## 2018-04-21 ENCOUNTER — Encounter: Payer: Medicare Other | Admitting: Obstetrics & Gynecology

## 2018-04-21 ENCOUNTER — Encounter: Payer: Medicare Other | Admitting: Gynecology

## 2018-05-06 DIAGNOSIS — B351 Tinea unguium: Secondary | ICD-10-CM | POA: Diagnosis not present

## 2018-05-06 DIAGNOSIS — L814 Other melanin hyperpigmentation: Secondary | ICD-10-CM | POA: Diagnosis not present

## 2018-06-02 ENCOUNTER — Ambulatory Visit (INDEPENDENT_AMBULATORY_CARE_PROVIDER_SITE_OTHER): Payer: Medicare Other | Admitting: Gynecology

## 2018-06-02 ENCOUNTER — Encounter: Payer: Self-pay | Admitting: Gynecology

## 2018-06-02 VITALS — BP 118/72 | Ht 63.0 in | Wt 144.0 lb

## 2018-06-02 DIAGNOSIS — N952 Postmenopausal atrophic vaginitis: Secondary | ICD-10-CM

## 2018-06-02 DIAGNOSIS — N8111 Cystocele, midline: Secondary | ICD-10-CM

## 2018-06-02 DIAGNOSIS — Z01419 Encounter for gynecological examination (general) (routine) without abnormal findings: Secondary | ICD-10-CM

## 2018-06-02 DIAGNOSIS — M8580 Other specified disorders of bone density and structure, unspecified site: Secondary | ICD-10-CM

## 2018-06-02 NOTE — Patient Instructions (Signed)
Call if you would like a referral to the uro-gynecologist as discussed.  Follow-up in 1 year for annual exam.

## 2018-06-02 NOTE — Progress Notes (Signed)
    Monica Ochoa 17-Oct-1946 [id number]        72 y.o.  [id number] for breast and pelvic exam.  History of cystocele with planned surgery by Dr. Matilde Sprang but apparently underwent anesthesia and then on exam under anesthesia he did not feel comfortable proceeding with the procedure and canceled the case.  He has referred her to a urologist for robotic sacral colpopexy.  Patient is unsure whether she wants to proceed with this.  Past medical history,surgical history, problem list, medications, allergies, family history and social history were all reviewed and documented as reviewed in the EPIC chart.  ROS:  Performed with pertinent positives and negatives included in the history, assessment and plan.   Additional significant findings : None   Exam: Monica Ochoa assistant Vitals:   06/02/18 1409  BP: 118/72  Weight: 144 lb (65.3 kg)  Height: 5\' 3"  (1.6 m)   Body mass index is 25.51 kg/m.  General appearance:  Normal affect, orientation and appearance. Skin: Grossly normal HEENT: Without gross lesions.  No cervical or supraclavicular adenopathy. Thyroid normal.  Lungs:  Clear without wheezing, rales or rhonchi Cardiac: RR, without RMG Abdominal:  Soft, nontender, without masses, guarding, rebound, organomegaly or hernia Breasts:  Examined lying and sitting without masses, retractions, discharge or axillary adenopathy. Pelvic:  Ext, BUS, Vagina: With atrophic changes.  First to second-degree cystocele.  Adnexa: Without masses or tenderness    Anus and perineum: Normal   Rectovaginal: Normal sphincter tone without palpated masses or tenderness.    Assessment/Plan:  72 y.o. [id number] female for breast and pelvic exam.   1. Postmenopausal/atrophic genital changes.  Status post [facility] A&P repair 1977 no significant menopausal symptoms.  Had been on HRT previously then stopped this and started having hot flushes and sweats.  She was represcribed HRT last year by Dr. Toney Rakes but ultimately  she never started it noting that she only has occasional hot flushes but no significant symptoms and prefers to stay off of it for now. 2. Cystocele.  Patient has referral to urologist for robotic procedure.  Also discussed alternative referral to Dr. Maryland Pink.  Patient wants to decide if she wants to proceed with surgery.  She is symptomatic from a bulging standpoint particularly when standing a long time and picking up her grandchild.  She will call me if she wants a referral to Dr. Zigmund Daniel otherwise she will decide whether to follow-up with the urologist as recommended by Dr. Matilde Sprang. 3. Osteopenia.  DEXA 02/2017 T score -2.2 FRAX 13% / 2.8%.  Plan repeat DEXA at 2-year interval. 4. Pap smear 2011.  No Pap smear done today.  No history of significant abnormal Pap smears.  Per current screening guidelines will discontinue screening based on age and hysterectomy history. 5. Mammography 02/2018.  Continue with annual mammography when due.  Breast exam normal today. 6. Colonoscopy within the last 10 years.  Repeat at 10-year interval per patient reported recommendation 7. Health maintenance.  No routine lab work done as patient does this elsewhere.  Follow-up 1 year, sooner as needed.   Monica Auerbach MD, 3:09 PM 06/02/2018

## 2018-06-08 DIAGNOSIS — R7303 Prediabetes: Secondary | ICD-10-CM | POA: Diagnosis not present

## 2018-06-08 DIAGNOSIS — R635 Abnormal weight gain: Secondary | ICD-10-CM | POA: Diagnosis not present

## 2018-06-08 DIAGNOSIS — E785 Hyperlipidemia, unspecified: Secondary | ICD-10-CM | POA: Diagnosis not present

## 2018-06-08 DIAGNOSIS — Z0000 Encounter for general adult medical examination without abnormal findings: Secondary | ICD-10-CM | POA: Diagnosis not present

## 2018-06-08 DIAGNOSIS — N8110 Cystocele, unspecified: Secondary | ICD-10-CM | POA: Diagnosis not present

## 2018-06-08 DIAGNOSIS — Z1389 Encounter for screening for other disorder: Secondary | ICD-10-CM | POA: Diagnosis not present

## 2018-06-08 DIAGNOSIS — J301 Allergic rhinitis due to pollen: Secondary | ICD-10-CM | POA: Diagnosis not present

## 2018-06-08 DIAGNOSIS — G4700 Insomnia, unspecified: Secondary | ICD-10-CM | POA: Diagnosis not present

## 2018-06-08 DIAGNOSIS — Z79899 Other long term (current) drug therapy: Secondary | ICD-10-CM | POA: Diagnosis not present

## 2018-06-08 DIAGNOSIS — F419 Anxiety disorder, unspecified: Secondary | ICD-10-CM | POA: Diagnosis not present

## 2018-06-09 DIAGNOSIS — R635 Abnormal weight gain: Secondary | ICD-10-CM | POA: Diagnosis not present

## 2018-06-09 DIAGNOSIS — R7303 Prediabetes: Secondary | ICD-10-CM | POA: Diagnosis not present

## 2018-06-09 DIAGNOSIS — E785 Hyperlipidemia, unspecified: Secondary | ICD-10-CM | POA: Diagnosis not present

## 2018-06-09 DIAGNOSIS — Z79899 Other long term (current) drug therapy: Secondary | ICD-10-CM | POA: Diagnosis not present

## 2018-08-31 DIAGNOSIS — Z23 Encounter for immunization: Secondary | ICD-10-CM | POA: Diagnosis not present

## 2019-01-17 DIAGNOSIS — R7303 Prediabetes: Secondary | ICD-10-CM | POA: Diagnosis not present

## 2019-01-18 DIAGNOSIS — F419 Anxiety disorder, unspecified: Secondary | ICD-10-CM | POA: Diagnosis not present

## 2019-01-18 DIAGNOSIS — R7303 Prediabetes: Secondary | ICD-10-CM | POA: Diagnosis not present

## 2019-01-18 DIAGNOSIS — E785 Hyperlipidemia, unspecified: Secondary | ICD-10-CM | POA: Diagnosis not present

## 2019-01-26 DIAGNOSIS — L814 Other melanin hyperpigmentation: Secondary | ICD-10-CM | POA: Diagnosis not present

## 2019-01-26 DIAGNOSIS — L821 Other seborrheic keratosis: Secondary | ICD-10-CM | POA: Diagnosis not present

## 2019-01-26 DIAGNOSIS — B351 Tinea unguium: Secondary | ICD-10-CM | POA: Diagnosis not present

## 2019-01-26 DIAGNOSIS — L570 Actinic keratosis: Secondary | ICD-10-CM | POA: Diagnosis not present

## 2019-01-26 DIAGNOSIS — B078 Other viral warts: Secondary | ICD-10-CM | POA: Diagnosis not present

## 2019-01-26 DIAGNOSIS — D1801 Hemangioma of skin and subcutaneous tissue: Secondary | ICD-10-CM | POA: Diagnosis not present

## 2019-04-05 ENCOUNTER — Other Ambulatory Visit: Payer: Self-pay | Admitting: Gynecology

## 2019-04-05 DIAGNOSIS — Z1231 Encounter for screening mammogram for malignant neoplasm of breast: Secondary | ICD-10-CM

## 2019-04-06 ENCOUNTER — Other Ambulatory Visit: Payer: Self-pay | Admitting: Gynecology

## 2019-04-06 DIAGNOSIS — Z780 Asymptomatic menopausal state: Secondary | ICD-10-CM

## 2019-05-24 ENCOUNTER — Ambulatory Visit: Payer: Medicare Other

## 2019-06-06 ENCOUNTER — Other Ambulatory Visit: Payer: Self-pay

## 2019-06-07 ENCOUNTER — Encounter: Payer: Medicare Other | Admitting: Gynecology

## 2019-06-14 DIAGNOSIS — E785 Hyperlipidemia, unspecified: Secondary | ICD-10-CM | POA: Diagnosis not present

## 2019-06-14 DIAGNOSIS — F419 Anxiety disorder, unspecified: Secondary | ICD-10-CM | POA: Diagnosis not present

## 2019-06-14 DIAGNOSIS — Z23 Encounter for immunization: Secondary | ICD-10-CM | POA: Diagnosis not present

## 2019-06-14 DIAGNOSIS — Z1389 Encounter for screening for other disorder: Secondary | ICD-10-CM | POA: Diagnosis not present

## 2019-06-14 DIAGNOSIS — Z0000 Encounter for general adult medical examination without abnormal findings: Secondary | ICD-10-CM | POA: Diagnosis not present

## 2019-06-14 DIAGNOSIS — G4700 Insomnia, unspecified: Secondary | ICD-10-CM | POA: Diagnosis not present

## 2019-06-14 DIAGNOSIS — R7303 Prediabetes: Secondary | ICD-10-CM | POA: Diagnosis not present

## 2019-06-14 DIAGNOSIS — Z79899 Other long term (current) drug therapy: Secondary | ICD-10-CM | POA: Diagnosis not present

## 2019-06-18 DIAGNOSIS — M8580 Other specified disorders of bone density and structure, unspecified site: Secondary | ICD-10-CM

## 2019-06-18 HISTORY — DX: Other specified disorders of bone density and structure, unspecified site: M85.80

## 2019-07-01 ENCOUNTER — Other Ambulatory Visit: Payer: Self-pay

## 2019-07-04 ENCOUNTER — Encounter: Payer: Self-pay | Admitting: Gynecology

## 2019-07-04 ENCOUNTER — Ambulatory Visit (INDEPENDENT_AMBULATORY_CARE_PROVIDER_SITE_OTHER): Payer: Medicare Other | Admitting: Gynecology

## 2019-07-04 ENCOUNTER — Other Ambulatory Visit: Payer: Self-pay

## 2019-07-04 VITALS — BP 122/70 | Ht 63.0 in | Wt 144.0 lb

## 2019-07-04 DIAGNOSIS — N952 Postmenopausal atrophic vaginitis: Secondary | ICD-10-CM

## 2019-07-04 DIAGNOSIS — Z01419 Encounter for gynecological examination (general) (routine) without abnormal findings: Secondary | ICD-10-CM | POA: Diagnosis not present

## 2019-07-04 DIAGNOSIS — M8580 Other specified disorders of bone density and structure, unspecified site: Secondary | ICD-10-CM

## 2019-07-04 DIAGNOSIS — N8111 Cystocele, midline: Secondary | ICD-10-CM

## 2019-07-04 NOTE — Progress Notes (Signed)
    Monica Ochoa Jul 29, 1946 [id number]        73 y.o.  [id number] for breast and pelvic exam.  Several issues noted below.  Past medical history,surgical history, problem list, medications, allergies, family history and social history were all reviewed and documented as reviewed in the EPIC chart.  ROS:  Performed with pertinent positives and negatives included in the history, assessment and plan.   Additional significant findings : None   Exam: Monica Ochoa assistant Vitals:   07/04/19 1358  BP: 122/70  Weight: 144 lb (65.3 kg)  Height: 5\' 3"  (1.6 m)   Body mass index is 25.51 kg/m.  General appearance:  Normal affect, orientation and appearance. Skin: Grossly normal HEENT: Without gross lesions.  No cervical or supraclavicular adenopathy. Thyroid normal.  Lungs:  Clear without wheezing, rales or rhonchi Cardiac: RR, without RMG Abdominal:  Soft, nontender, without masses, guarding, rebound, organomegaly or hernia Breasts:  Examined lying and sitting without masses, retractions, discharge or axillary adenopathy. Pelvic:  Ext, BUS, Vagina: With atrophic changes.  First to second-degree cystocele.  Cuff well supported.  No significant rectocele  Adnexa: Without masses or tenderness    Anus and perineum: Normal   Rectovaginal: Normal sphincter tone without palpated masses or tenderness.    Assessment/Plan:  73 y.o. [id number] female for breast and pelvic exam  1. Postmenopausal.  Status post [facility] A&P repair 1977.  No significant menopausal symptoms. 2. Cystocele.  Was going to have it surgically repaired but has decided against it right now.  Prefers monitoring.  She will follow-up with Dr. Maryland Pink if she decides she wants to proceed with surgical repair. 3. Mammography scheduled next week and she will follow-up for this.  Breast exam normal today. 4. Osteopenia.  DEXA 2018 T score -2.2 FRAX 13% / 2.8%.  Has DEXA scheduled next week with mammogram. 5. Colonoscopy 7  years ago.  Repeat at their recommended interval. 6. Pap smear 2011.  No Pap smear done today.  No history of significant abnormal Pap smears.  We both agree to stop screening per current screening guidelines. 7. Health maintenance.  No routine lab work done as patient does this elsewhere.  Follow-up 1 year, sooner as needed.   [name] MD, 2:24 PM 07/04/2019

## 2019-07-04 NOTE — Patient Instructions (Signed)
Follow-up in 1 year for annual exam, sooner if any issues. 

## 2019-07-06 ENCOUNTER — Other Ambulatory Visit: Payer: Self-pay

## 2019-07-06 ENCOUNTER — Ambulatory Visit
Admission: RE | Admit: 2019-07-06 | Discharge: 2019-07-06 | Disposition: A | Discharge status: Home / Self Care | Payer: Medicare Other | Source: Ambulatory Visit | Attending: Gynecology | Admitting: Gynecology

## 2019-07-06 DIAGNOSIS — Z1231 Encounter for screening mammogram for malignant neoplasm of breast: Secondary | ICD-10-CM | POA: Diagnosis not present

## 2019-07-06 DIAGNOSIS — M8580 Other specified disorders of bone density and structure, unspecified site: Secondary | ICD-10-CM

## 2019-07-06 DIAGNOSIS — Z780 Asymptomatic menopausal state: Secondary | ICD-10-CM | POA: Diagnosis not present

## 2019-07-06 DIAGNOSIS — M8589 Other specified disorders of bone density and structure, multiple sites: Secondary | ICD-10-CM | POA: Diagnosis not present

## 2019-07-06 IMAGING — MG DIGITAL SCREENING BILATERAL MAMMOGRAM WITH TOMO AND CAD
8 series · 8 of 24 positions shown · non-contrast
Comparison: Previous exam(s).

CLINICAL DATA: Screening.

EXAM:
DIGITAL SCREENING BILATERAL MAMMOGRAM WITH TOMO AND CAD

[R MLO synth-2D]
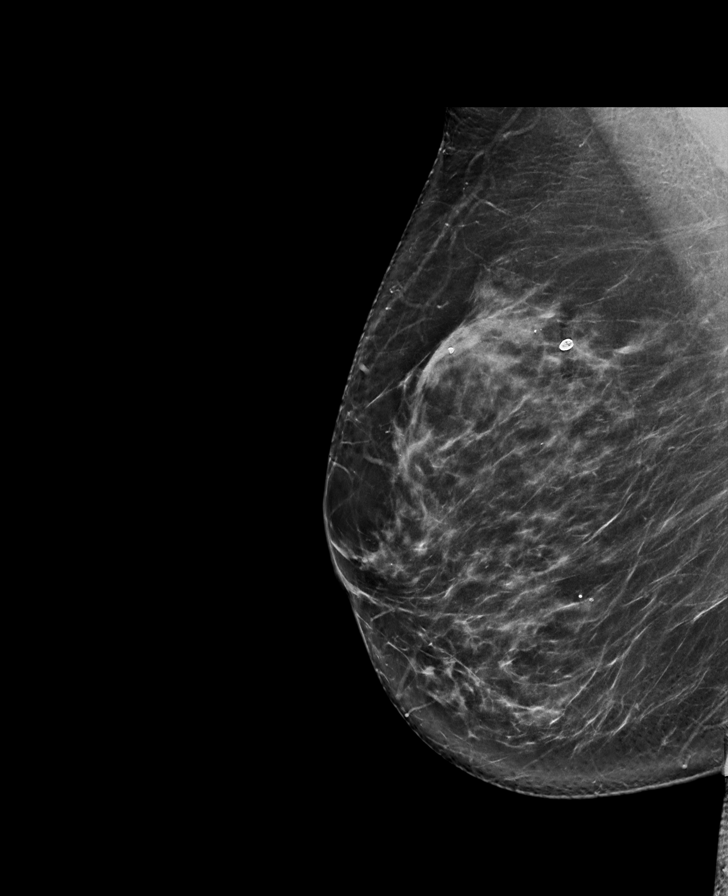

[L MLO synth-2D]
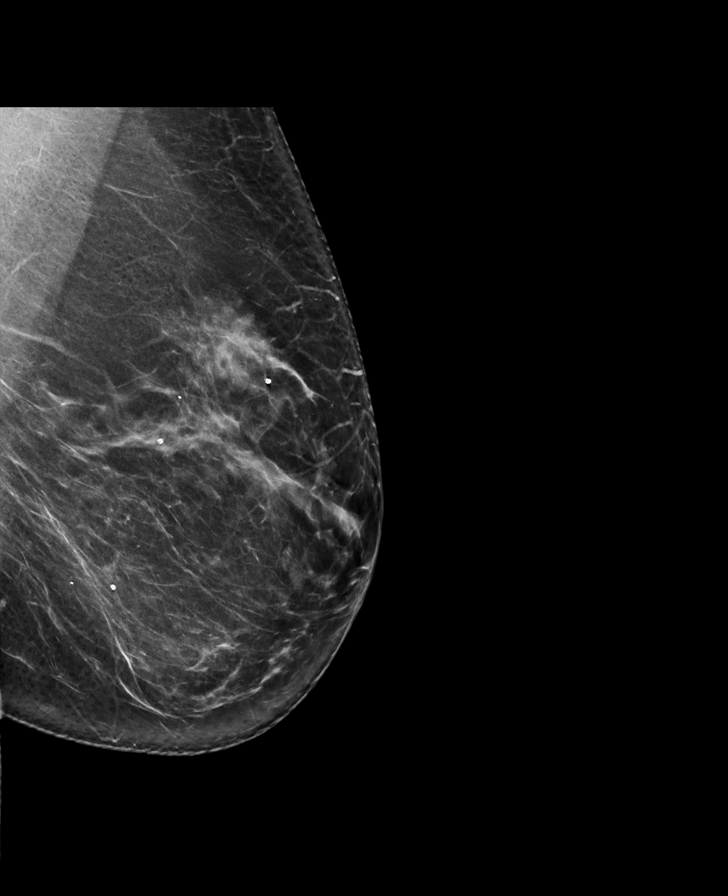

[L CC synth-2D]
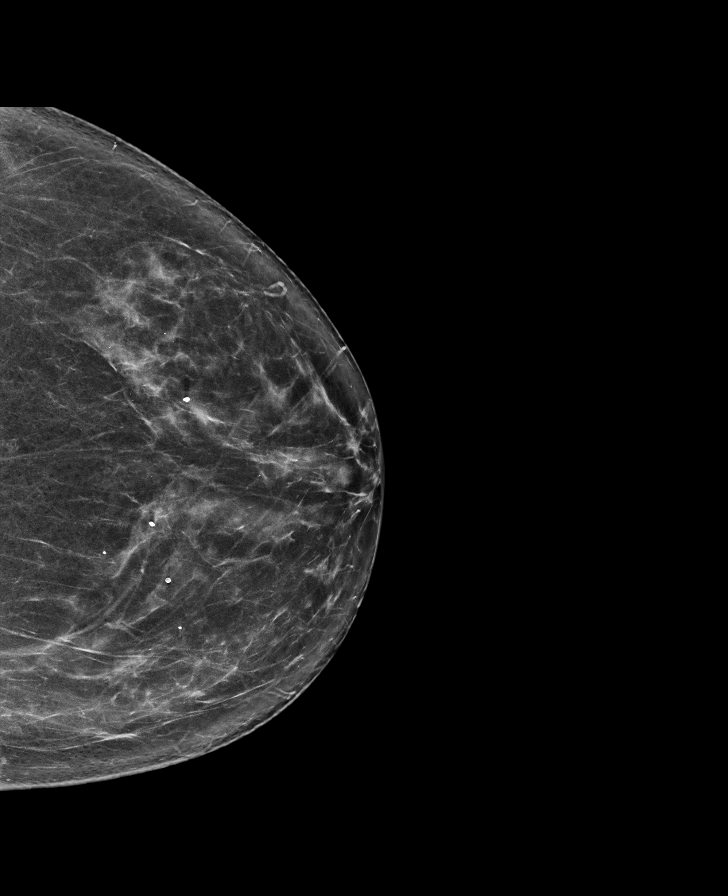

[R CC synth-2D]
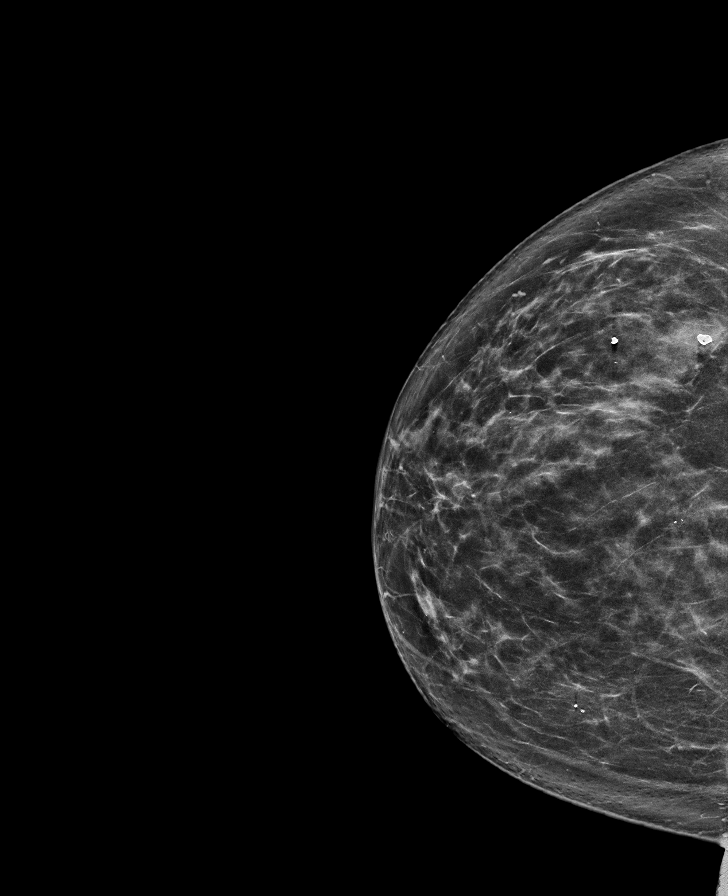

[R CC tomo · tomo slice 39/78.0]
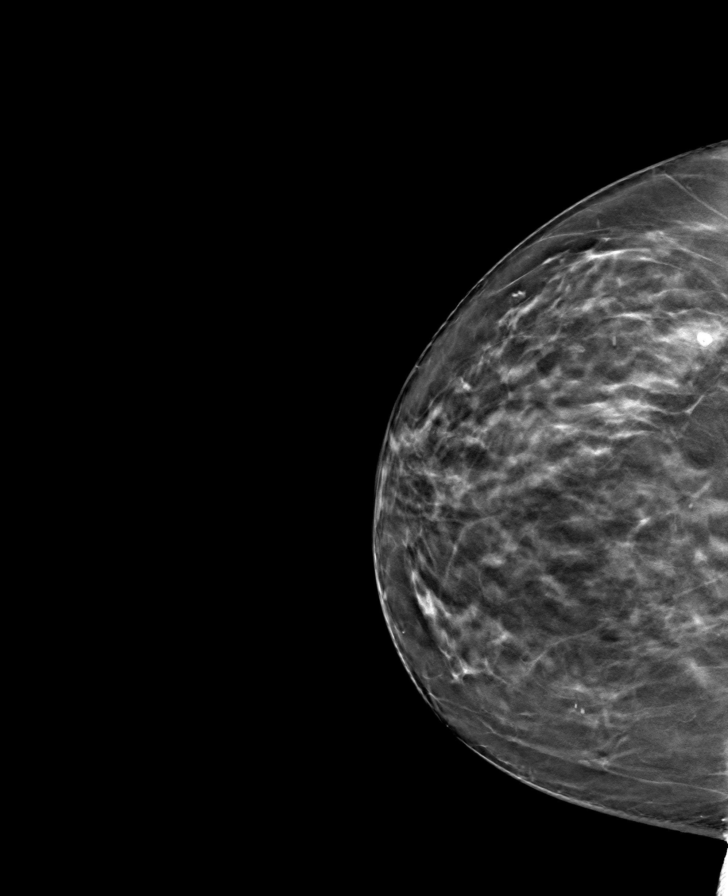

[L MLO tomo · tomo slice 43/85.0]
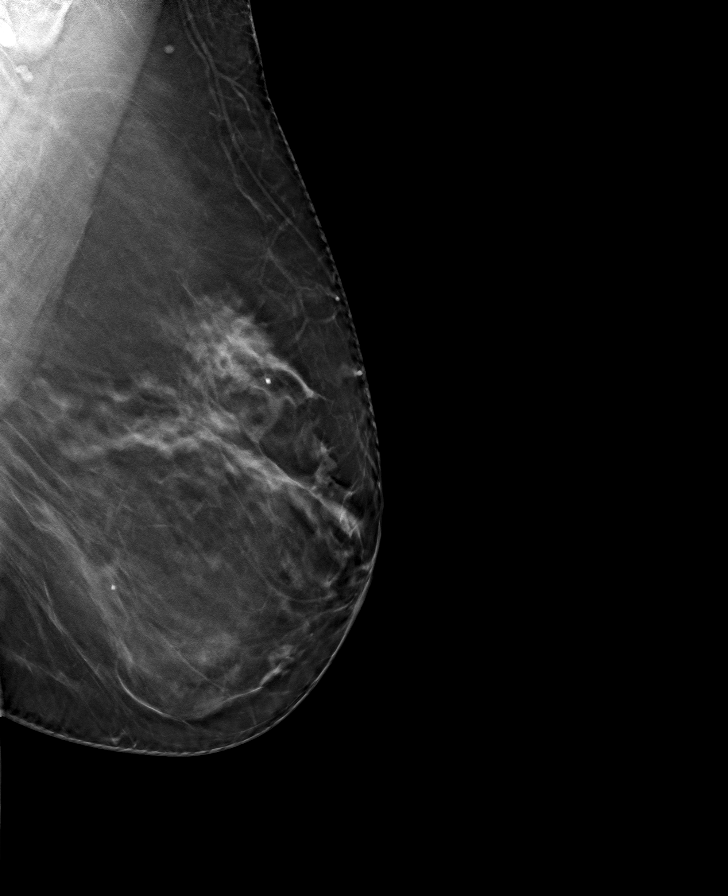

[L CC tomo · tomo slice 39/76.0]
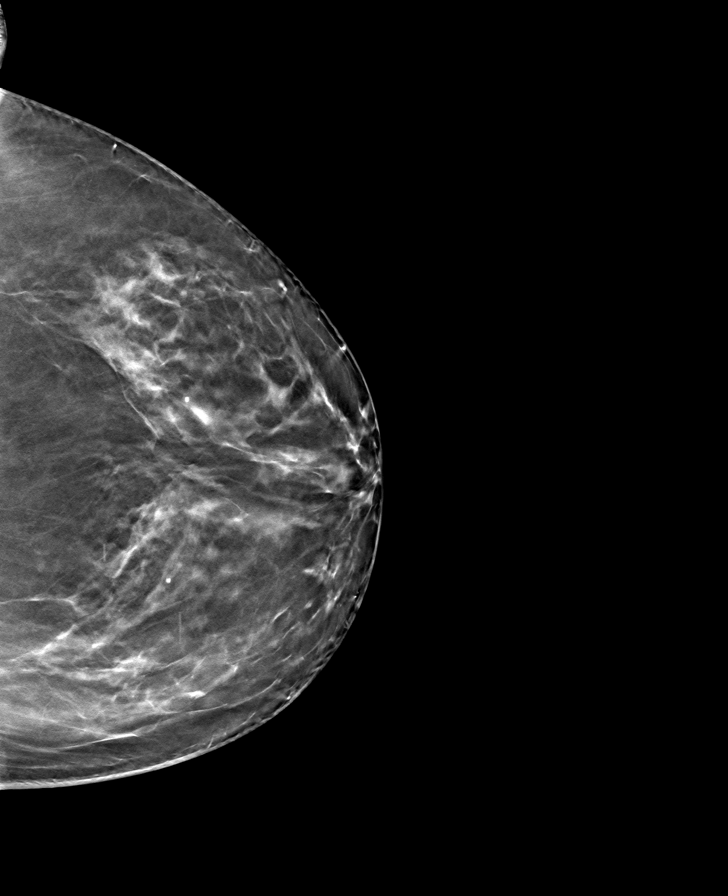

[R MLO tomo · tomo slice 43/85.0]
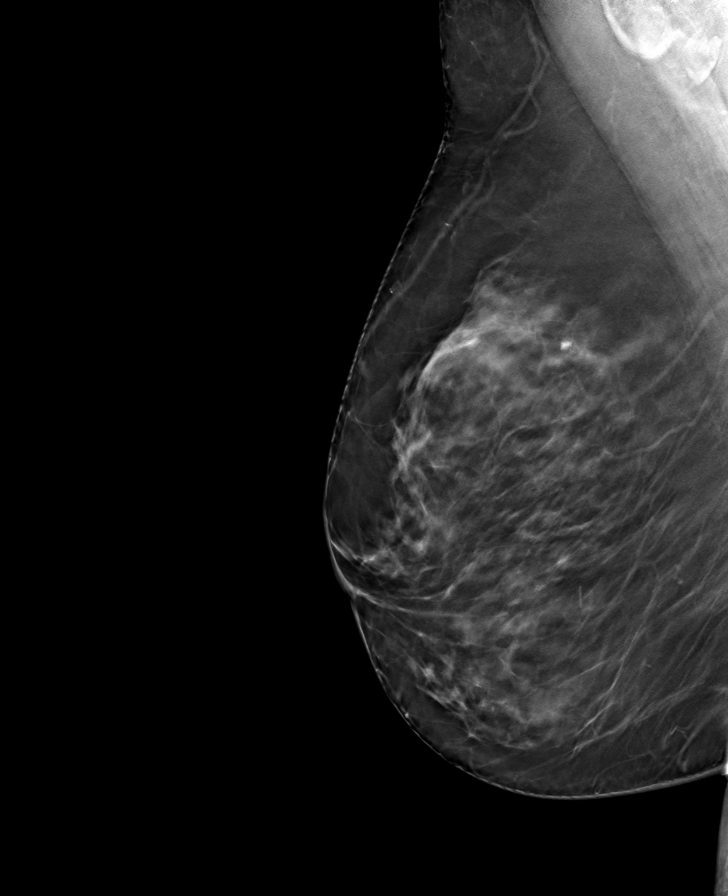

[8 of 24 positions shown; findings below may reference images not displayed]

ACR Breast Density Category b: There are scattered areas of
fibroglandular density.
FINDINGS: There are no findings suspicious for malignancy. Images were
processed with CAD.
IMPRESSION: No mammographic evidence of malignancy. A result letter of this
screening mammogram will be mailed directly to the patient.

RECOMMENDATION:
Screening mammogram in one year. (Code:[TQ])

BI-RADS CATEGORY  1: Negative.

## 2019-07-07 ENCOUNTER — Encounter: Payer: Self-pay | Admitting: Gynecology

## 2019-08-24 ENCOUNTER — Encounter: Payer: Self-pay | Admitting: Gynecology

## 2019-09-13 ENCOUNTER — Encounter (INDEPENDENT_AMBULATORY_CARE_PROVIDER_SITE_OTHER): Payer: Self-pay

## 2019-10-21 DIAGNOSIS — M5412 Radiculopathy, cervical region: Secondary | ICD-10-CM | POA: Diagnosis not present

## 2020-02-28 DIAGNOSIS — F4323 Adjustment disorder with mixed anxiety and depressed mood: Secondary | ICD-10-CM | POA: Diagnosis not present

## 2020-02-28 DIAGNOSIS — E785 Hyperlipidemia, unspecified: Secondary | ICD-10-CM | POA: Diagnosis not present

## 2020-02-28 DIAGNOSIS — R7303 Prediabetes: Secondary | ICD-10-CM | POA: Diagnosis not present

## 2020-02-28 DIAGNOSIS — R002 Palpitations: Secondary | ICD-10-CM | POA: Diagnosis not present

## 2020-02-28 DIAGNOSIS — M5412 Radiculopathy, cervical region: Secondary | ICD-10-CM | POA: Diagnosis not present

## 2020-02-28 DIAGNOSIS — R030 Elevated blood-pressure reading, without diagnosis of hypertension: Secondary | ICD-10-CM | POA: Diagnosis not present

## 2020-02-28 DIAGNOSIS — J301 Allergic rhinitis due to pollen: Secondary | ICD-10-CM | POA: Diagnosis not present

## 2020-03-26 DIAGNOSIS — R002 Palpitations: Secondary | ICD-10-CM | POA: Diagnosis not present

## 2020-03-26 DIAGNOSIS — F419 Anxiety disorder, unspecified: Secondary | ICD-10-CM | POA: Diagnosis not present

## 2020-03-26 DIAGNOSIS — F4323 Adjustment disorder with mixed anxiety and depressed mood: Secondary | ICD-10-CM | POA: Diagnosis not present

## 2020-03-27 DIAGNOSIS — D1801 Hemangioma of skin and subcutaneous tissue: Secondary | ICD-10-CM | POA: Diagnosis not present

## 2020-03-27 DIAGNOSIS — L814 Other melanin hyperpigmentation: Secondary | ICD-10-CM | POA: Diagnosis not present

## 2020-03-27 DIAGNOSIS — L821 Other seborrheic keratosis: Secondary | ICD-10-CM | POA: Diagnosis not present

## 2020-03-27 DIAGNOSIS — L820 Inflamed seborrheic keratosis: Secondary | ICD-10-CM | POA: Diagnosis not present

## 2020-03-27 DIAGNOSIS — D2271 Melanocytic nevi of right lower limb, including hip: Secondary | ICD-10-CM | POA: Diagnosis not present

## 2020-03-27 DIAGNOSIS — D225 Melanocytic nevi of trunk: Secondary | ICD-10-CM | POA: Diagnosis not present

## 2020-03-27 DIAGNOSIS — B351 Tinea unguium: Secondary | ICD-10-CM | POA: Diagnosis not present

## 2020-07-04 DIAGNOSIS — F419 Anxiety disorder, unspecified: Secondary | ICD-10-CM | POA: Diagnosis not present

## 2020-07-04 DIAGNOSIS — E1169 Type 2 diabetes mellitus with other specified complication: Secondary | ICD-10-CM | POA: Diagnosis not present

## 2020-07-04 DIAGNOSIS — I10 Essential (primary) hypertension: Secondary | ICD-10-CM | POA: Diagnosis not present

## 2020-07-04 DIAGNOSIS — Z0000 Encounter for general adult medical examination without abnormal findings: Secondary | ICD-10-CM | POA: Diagnosis not present

## 2020-07-04 DIAGNOSIS — Z1159 Encounter for screening for other viral diseases: Secondary | ICD-10-CM | POA: Diagnosis not present

## 2020-07-04 DIAGNOSIS — E782 Mixed hyperlipidemia: Secondary | ICD-10-CM | POA: Diagnosis not present

## 2020-08-09 DIAGNOSIS — F419 Anxiety disorder, unspecified: Secondary | ICD-10-CM | POA: Diagnosis not present

## 2020-08-09 DIAGNOSIS — E782 Mixed hyperlipidemia: Secondary | ICD-10-CM | POA: Diagnosis not present

## 2020-08-09 DIAGNOSIS — E1169 Type 2 diabetes mellitus with other specified complication: Secondary | ICD-10-CM | POA: Diagnosis not present

## 2020-08-09 DIAGNOSIS — I10 Essential (primary) hypertension: Secondary | ICD-10-CM | POA: Diagnosis not present

## 2020-08-15 ENCOUNTER — Other Ambulatory Visit: Payer: Self-pay | Admitting: Family Medicine

## 2020-08-15 DIAGNOSIS — Z1231 Encounter for screening mammogram for malignant neoplasm of breast: Secondary | ICD-10-CM

## 2020-08-16 ENCOUNTER — Other Ambulatory Visit: Payer: Self-pay

## 2020-08-16 ENCOUNTER — Ambulatory Visit
Admission: RE | Admit: 2020-08-16 | Discharge: 2020-08-16 | Disposition: A | Discharge status: Home / Self Care | Payer: Medicare Other | Source: Ambulatory Visit | Attending: Family Medicine | Admitting: Family Medicine

## 2020-08-16 DIAGNOSIS — Z1231 Encounter for screening mammogram for malignant neoplasm of breast: Secondary | ICD-10-CM

## 2020-08-16 IMAGING — MG DIGITAL SCREENING BILAT W/ TOMO W/ CAD
8 series · 8 of 24 positions shown · non-contrast
Comparison: Previous exam(s).

CLINICAL DATA: Screening. LEFT excisional biopsy

EXAM:
DIGITAL SCREENING BILATERAL MAMMOGRAM WITH TOMO AND CAD

[L CC synth-2D]
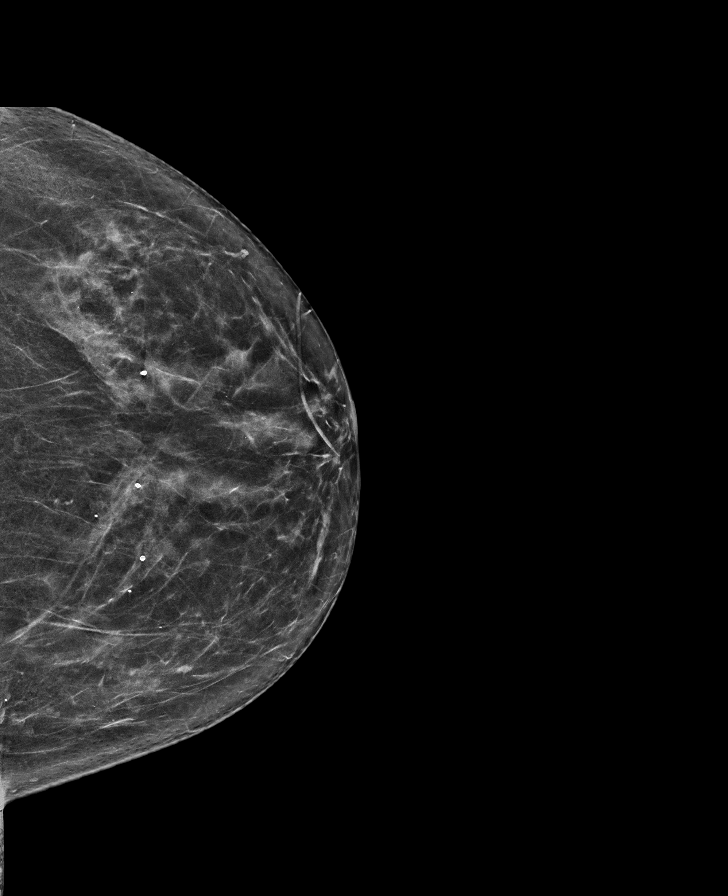

[R MLO synth-2D]
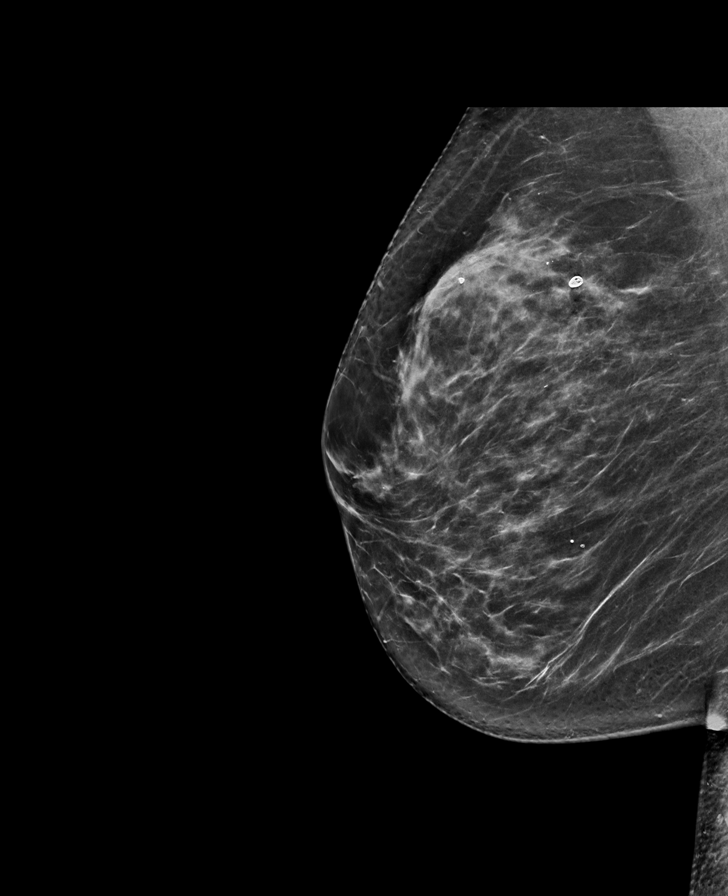

[L MLO synth-2D]
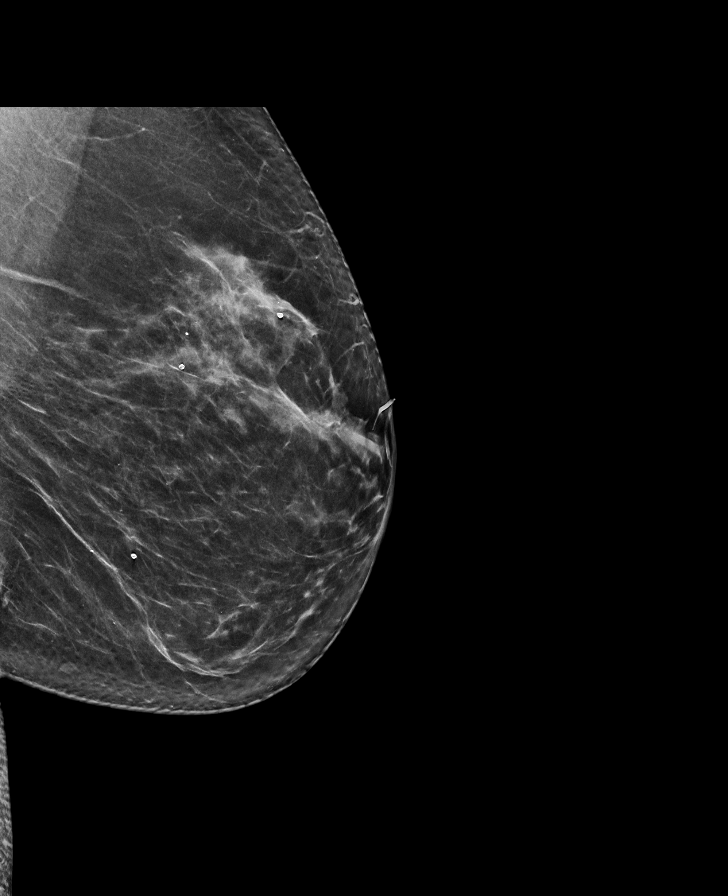

[R CC synth-2D]
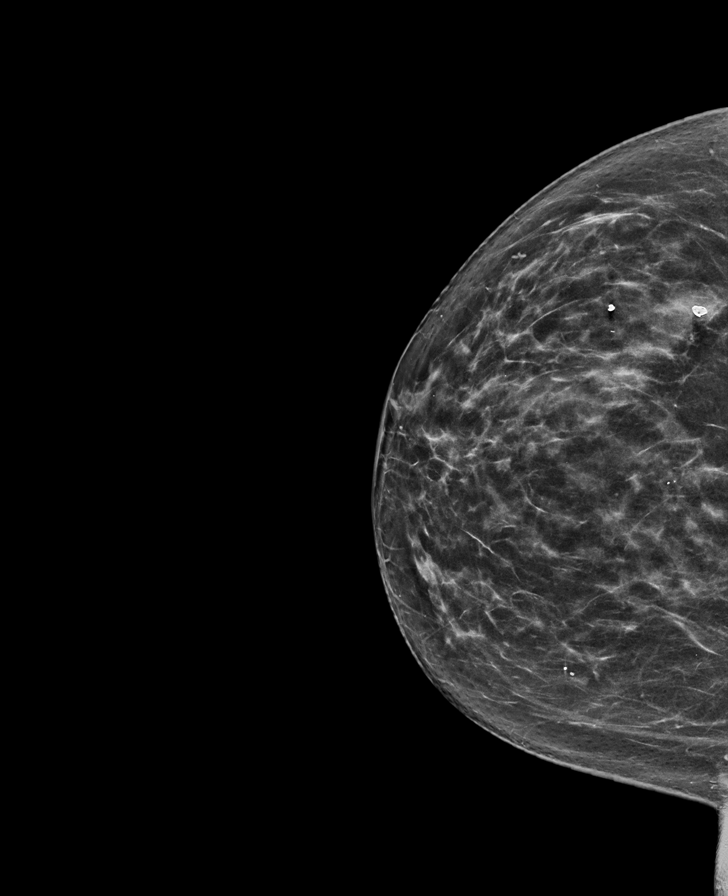

[L MLO tomo · tomo slice 37/74.0]
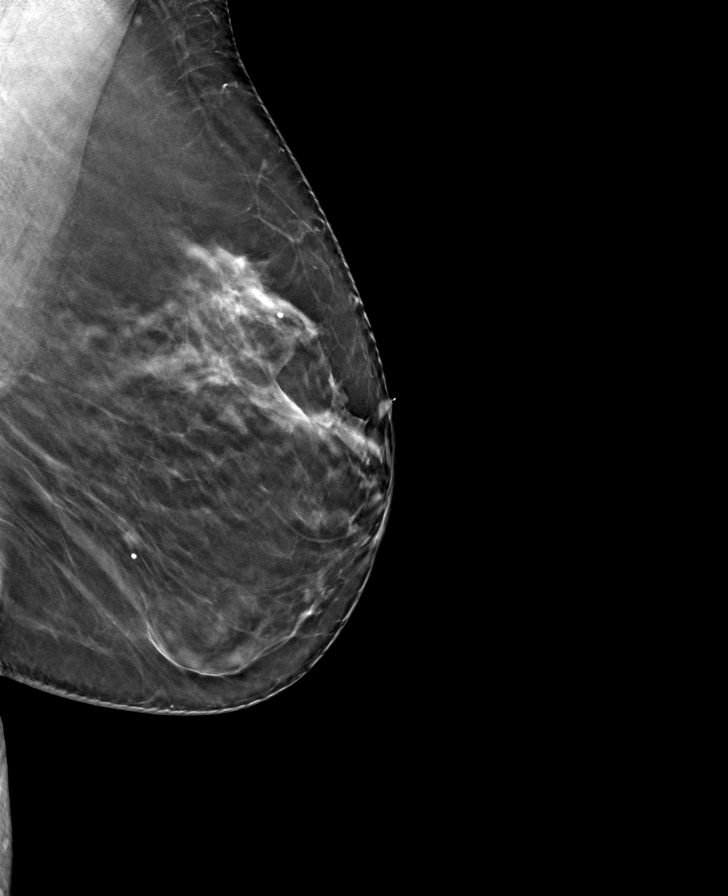

[L CC tomo · tomo slice 35/68.0]
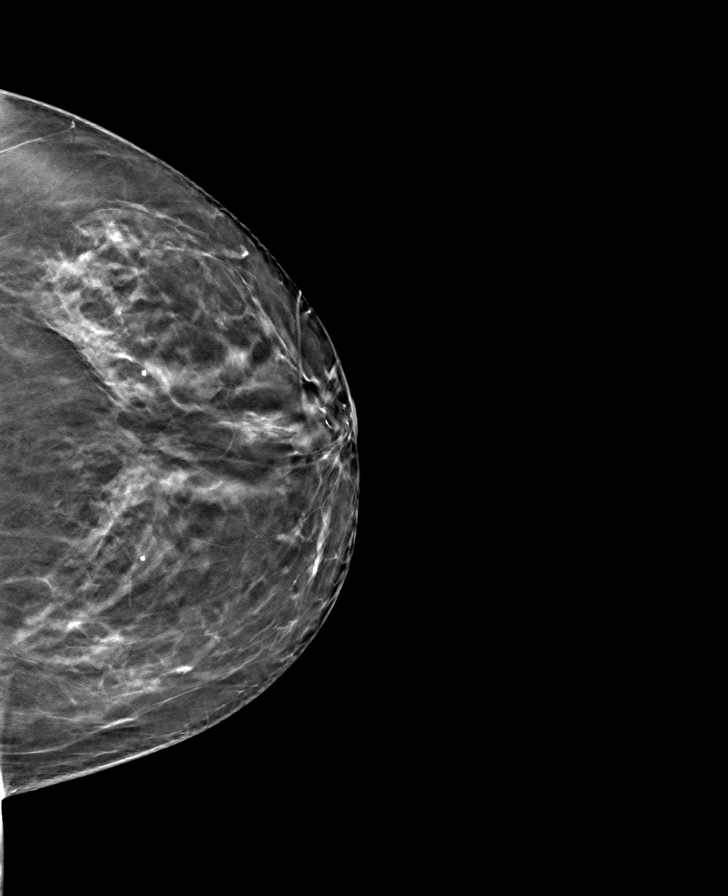

[R MLO tomo · tomo slice 39/78.0]
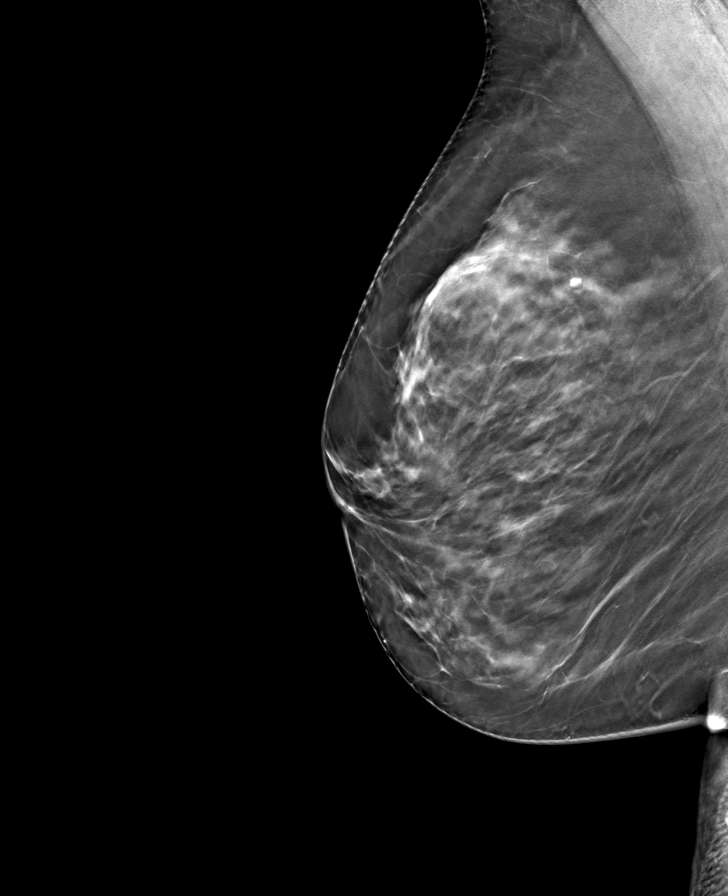

[R CC tomo · tomo slice 37/73.0]
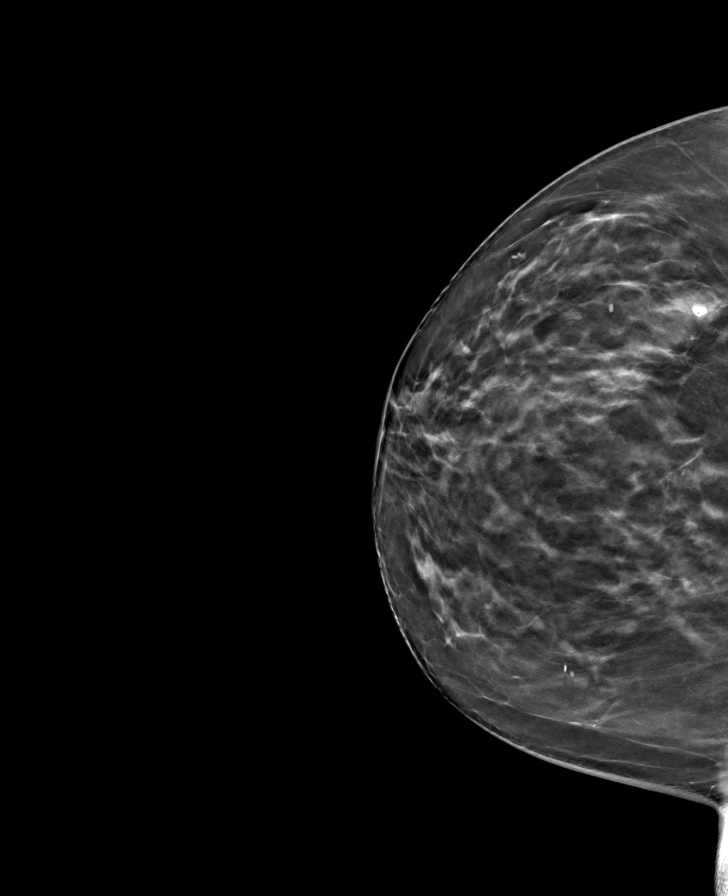

[8 of 24 positions shown; findings below may reference images not displayed]

ACR Breast Density Category c: The breast tissue is heterogeneously
dense, which may obscure small masses.
FINDINGS: There are no findings suspicious for malignancy. There is density
and architectural distortion within the LEFT breast, consistent with
postsurgical changes. These are stable in comparison to prior.
Images were processed with CAD.
IMPRESSION: No mammographic evidence of malignancy. A result letter of this
screening mammogram will be mailed directly to the patient.

RECOMMENDATION:
Screening mammogram in one year. (Code:[99])

BI-RADS CATEGORY  2: Benign.

## 2020-09-23 DIAGNOSIS — Z23 Encounter for immunization: Secondary | ICD-10-CM | POA: Diagnosis not present

## 2020-10-01 ENCOUNTER — Encounter: Payer: Medicare Other | Admitting: Nurse Practitioner

## 2020-10-08 DIAGNOSIS — R42 Dizziness and giddiness: Secondary | ICD-10-CM | POA: Diagnosis not present

## 2020-10-08 DIAGNOSIS — R0981 Nasal congestion: Secondary | ICD-10-CM | POA: Diagnosis not present

## 2020-10-30 DIAGNOSIS — H5319 Other subjective visual disturbances: Secondary | ICD-10-CM | POA: Diagnosis not present

## 2020-10-30 DIAGNOSIS — H2513 Age-related nuclear cataract, bilateral: Secondary | ICD-10-CM | POA: Diagnosis not present

## 2020-10-30 DIAGNOSIS — H35363 Drusen (degenerative) of macula, bilateral: Secondary | ICD-10-CM | POA: Diagnosis not present

## 2020-10-30 DIAGNOSIS — H04123 Dry eye syndrome of bilateral lacrimal glands: Secondary | ICD-10-CM | POA: Diagnosis not present

## 2020-10-30 DIAGNOSIS — E119 Type 2 diabetes mellitus without complications: Secondary | ICD-10-CM | POA: Diagnosis not present

## 2020-11-02 DIAGNOSIS — D485 Neoplasm of uncertain behavior of skin: Secondary | ICD-10-CM | POA: Diagnosis not present

## 2020-11-02 DIAGNOSIS — L570 Actinic keratosis: Secondary | ICD-10-CM | POA: Diagnosis not present

## 2020-11-05 DIAGNOSIS — J3489 Other specified disorders of nose and nasal sinuses: Secondary | ICD-10-CM | POA: Diagnosis not present

## 2020-11-05 DIAGNOSIS — R2689 Other abnormalities of gait and mobility: Secondary | ICD-10-CM | POA: Diagnosis not present

## 2020-11-05 DIAGNOSIS — J320 Chronic maxillary sinusitis: Secondary | ICD-10-CM | POA: Diagnosis not present

## 2020-11-05 DIAGNOSIS — J342 Deviated nasal septum: Secondary | ICD-10-CM | POA: Diagnosis not present

## 2020-11-05 DIAGNOSIS — R0982 Postnasal drip: Secondary | ICD-10-CM | POA: Diagnosis not present

## 2020-11-06 ENCOUNTER — Ambulatory Visit (INDEPENDENT_AMBULATORY_CARE_PROVIDER_SITE_OTHER): Payer: Medicare Other | Admitting: Nurse Practitioner

## 2020-11-06 ENCOUNTER — Encounter: Payer: Self-pay | Admitting: Nurse Practitioner

## 2020-11-06 ENCOUNTER — Other Ambulatory Visit: Payer: Self-pay

## 2020-11-06 VITALS — BP 115/78 | Ht 63.0 in | Wt 139.2 lb

## 2020-11-06 DIAGNOSIS — Z01419 Encounter for gynecological examination (general) (routine) without abnormal findings: Secondary | ICD-10-CM

## 2020-11-06 DIAGNOSIS — N8110 Cystocele, unspecified: Secondary | ICD-10-CM

## 2020-11-06 DIAGNOSIS — Z90710 Acquired absence of both cervix and uterus: Secondary | ICD-10-CM

## 2020-11-06 DIAGNOSIS — M8589 Other specified disorders of bone density and structure, multiple sites: Secondary | ICD-10-CM

## 2020-11-06 NOTE — Patient Instructions (Signed)
Health Maintenance After Age 73 After age 73, you are at a higher risk for certain long-term diseases and infections as well as injuries from falls. Falls are a major cause of broken bones and head injuries in people who are older than age 73. Getting regular preventive care can help to keep you healthy and well. Preventive care includes getting regular testing and making lifestyle changes as recommended by your health care provider. Talk with your health care provider about:  Which screenings and tests you should have. A screening is a test that checks for a disease when you have no symptoms.  A diet and exercise plan that is right for you. What should I know about screenings and tests to prevent falls? Screening and testing are the best ways to find a health problem early. Early diagnosis and treatment give you the best chance of managing medical conditions that are common after age 73. Certain conditions and lifestyle choices may make you more likely to have a fall. Your health care provider may recommend:  Regular vision checks. Poor vision and conditions such as cataracts can make you more likely to have a fall. If you wear glasses, make sure to get your prescription updated if your vision changes.  Medicine review. Work with your health care provider to regularly review all of the medicines you are taking, including over-the-counter medicines. Ask your health care provider about any side effects that may make you more likely to have a fall. Tell your health care provider if any medicines that you take make you feel dizzy or sleepy.  Osteoporosis screening. Osteoporosis is a condition that causes the bones to get weaker. This can make the bones weak and cause them to break more easily.  Blood pressure screening. Blood pressure changes and medicines to control blood pressure can make you feel dizzy.  Strength and balance checks. Your health care provider may recommend certain tests to check your  strength and balance while standing, walking, or changing positions.  Foot health exam. Foot pain and numbness, as well as not wearing proper footwear, can make you more likely to have a fall.  Depression screening. You may be more likely to have a fall if you have a fear of falling, feel emotionally low, or feel unable to do activities that you used to do.  Alcohol use screening. Using too much alcohol can affect your balance and may make you more likely to have a fall. What actions can I take to lower my risk of falls? General instructions  Talk with your health care provider about your risks for falling. Tell your health care provider if: ? You fall. Be sure to tell your health care provider about all falls, even ones that seem minor. ? You feel dizzy, sleepy, or off-balance.  Take over-the-counter and prescription medicines only as told by your health care provider. These include any supplements.  Eat a healthy diet and maintain a healthy weight. A healthy diet includes low-fat dairy products, low-fat (lean) meats, and fiber from whole grains, beans, and lots of fruits and vegetables. Home safety  Remove any tripping hazards, such as rugs, cords, and clutter.  Install safety equipment such as grab bars in bathrooms and safety rails on stairs.  Keep rooms and walkways well-lit. Activity   Follow a regular exercise program to stay fit. This will help you maintain your balance. Ask your health care provider what types of exercise are appropriate for you.  If you need a cane or   walker, use it as recommended by your health care provider.  Wear supportive shoes that have nonskid soles. Lifestyle  Do not drink alcohol if your health care provider tells you not to drink.  If you drink alcohol, limit how much you have: ? 0-1 drink a day for women. ? 0-2 drinks a day for men.  Be aware of how much alcohol is in your drink. In the [place] one drink equals one typical bottle of beer (12  oz), one-half glass of wine (5 oz), or one shot of hard liquor (1 oz).  Do not use any products that contain nicotine or tobacco, such as cigarettes and e-cigarettes. If you need help quitting, ask your health care provider. Summary  Having a healthy lifestyle and getting preventive care can help to protect your health and wellness after age 73.  Screening and testing are the best way to find a health problem early and help you avoid having a fall. Early diagnosis and treatment give you the best chance for managing medical conditions that are more common for people who are older than age 73.  Falls are a major cause of broken bones and head injuries in people who are older than age 73. Take precautions to prevent a fall at home.  Work with your health care provider to learn what changes you can make to improve your health and wellness and to prevent falls. This information is not intended to replace advice given to you by your health care provider. Make sure you discuss any questions you have with your health care provider. Document Revised: 02/24/2019 Document Reviewed: 09/16/2017 Elsevier Patient Education  2020 [employer] Inc[employer]  

## 2020-11-06 NOTE — Progress Notes (Signed)
   Monica Ochoa 1946/07/13 [id number]   History:  74 y.o. [id number] presents for breast and pelvic exam. 1977 TVH with A & P repair- on no HRT.  Normal pap and mammogram history. Urology following for cystocele. She planned to have bladder repair 3 years ago but they were unable to perform vaginally due to previous muscle repair. They recommended a urogyn referral for laparoscopic repair. She has not seen anyone yet and is not ready to do so. Denies urinary leakage.Osteopenia. T2DM.   Gynecologic History No LMP recorded. Patient has had a hysterectomy.   Contraception: status post hysterectomy Last Pap: No longer screening per guidelines Last mammogram: 08/19/2020. Results were: Normal  Last colonoscopy: 8 years ago. Results were: Normal Last Dexa: 07/06/2019. Results were: t-score -2.1  Past medical history, past surgical history, family history and social history were all reviewed and documented in the EPIC chart.  ROS:  A ROS was performed and pertinent positives and negatives are included.  Exam:  Vitals:   11/06/20 1210  BP: 115/78  Weight: 139 lb 3.2 oz (63.1 kg)  Height: 5\' 3"  (1.6 m)   Body mass index is 24.66 kg/m.  General appearance:  Normal Thyroid:  Symmetrical, normal in size, without palpable masses or nodularity. Respiratory  Auscultation:  Clear without wheezing or rhonchi Cardiovascular  Auscultation:  Regular rate, without rubs, murmurs or gallops  Edema/varicosities:  Not grossly evident Abdominal  Soft,nontender, without masses, guarding or rebound.  Liver/spleen:  No organomegaly noted  Hernia:  None appreciated  Skin  Inspection:  Grossly normal   Breasts: Examined lying and sitting.   Right: Without masses, retractions, discharge or axillary adenopathy.   Left: Without masses, retractions, discharge or axillary adenopathy. Gentitourinary   Inguinal/mons:  Normal without inguinal adenopathy  External genitalia:  Normal  BUS/Urethra/Skene's  glands:  Normal  Vagina:  Cystocele  Cervix:  Absent  Uterus: Absent  Adnexa/parametria:     Rt: Without masses or tenderness.   Lt: Without masses or tenderness.  Anus and perineum: Normal  Digital rectal exam: Normal sphincter tone without palpated masses or tenderness  Assessment/Plan:  74 y.o. [id number] for breast and pelvic exam.   Well female exam with routine gynecological exam - Education provided on SBEs, importance of preventative screenings, current guidelines, high calcium diet, regular exercise, and multivitamin daily. Labs with PCP.   Osteopenia of multiple sites - 06/2019 t-score -2.1.Taking daily Vitamin D supplement and exercising regularly. Will repeat bone density at 2-year interval per recommendation.   Female cystocele - asymptomatic. Urology was following for cystocele and she planned to have bladder repair 3 years ago but they were unable to perform vaginally due to previous muscle repair. They recommended a urogyn referral for laparoscopic repair. She has not seen anyone yet and is not ready to do so. When she is ready she will call for referral to Urogynecology.   History of total vaginal hysterectomy (TVH) - 1977 with A & P repair. No HRT.   Screening for cervical cancer - Normal Pap history.  No longer screening per guidelines.  Screening for breast cancer - Normal mammogram history.  Continue annual screenings.  Normal breast exam today.  Screening for colon cancer - Screening colonoscopy 8 years ago. Will repeat at GI's recommended interval.   Follow up in 1 year for annual.       Tamela Gammon [facility], 12:26 PM 11/06/2020

## 2020-11-13 DIAGNOSIS — E782 Mixed hyperlipidemia: Secondary | ICD-10-CM | POA: Diagnosis not present

## 2020-11-13 DIAGNOSIS — I10 Essential (primary) hypertension: Secondary | ICD-10-CM | POA: Diagnosis not present

## 2020-11-13 DIAGNOSIS — E1169 Type 2 diabetes mellitus with other specified complication: Secondary | ICD-10-CM | POA: Diagnosis not present

## 2020-11-13 DIAGNOSIS — R42 Dizziness and giddiness: Secondary | ICD-10-CM | POA: Diagnosis not present

## 2020-11-15 ENCOUNTER — Encounter: Payer: Self-pay | Admitting: Neurology

## 2020-11-18 ENCOUNTER — Other Ambulatory Visit: Payer: Self-pay

## 2020-11-18 ENCOUNTER — Emergency Department (HOSPITAL_COMMUNITY)
Admission: EM | Admit: 2020-11-18 | Discharge: 2020-11-19 | Disposition: A | Discharge status: Home / Self Care | Payer: Medicare Other | Attending: Emergency Medicine | Admitting: Emergency Medicine

## 2020-11-18 ENCOUNTER — Emergency Department (HOSPITAL_COMMUNITY): Payer: Medicare Other

## 2020-11-18 ENCOUNTER — Encounter (HOSPITAL_COMMUNITY): Payer: Self-pay | Admitting: Emergency Medicine

## 2020-11-18 DIAGNOSIS — R270 Ataxia, unspecified: Secondary | ICD-10-CM | POA: Diagnosis present

## 2020-11-18 DIAGNOSIS — E119 Type 2 diabetes mellitus without complications: Secondary | ICD-10-CM | POA: Diagnosis not present

## 2020-11-18 DIAGNOSIS — G9389 Other specified disorders of brain: Secondary | ICD-10-CM | POA: Insufficient documentation

## 2020-11-18 DIAGNOSIS — Z79899 Other long term (current) drug therapy: Secondary | ICD-10-CM | POA: Insufficient documentation

## 2020-11-18 DIAGNOSIS — H9202 Otalgia, left ear: Secondary | ICD-10-CM | POA: Diagnosis not present

## 2020-11-18 DIAGNOSIS — I10 Essential (primary) hypertension: Secondary | ICD-10-CM | POA: Diagnosis not present

## 2020-11-18 DIAGNOSIS — R110 Nausea: Secondary | ICD-10-CM | POA: Insufficient documentation

## 2020-11-18 DIAGNOSIS — R2681 Unsteadiness on feet: Secondary | ICD-10-CM | POA: Diagnosis not present

## 2020-11-18 DIAGNOSIS — R519 Headache, unspecified: Secondary | ICD-10-CM | POA: Insufficient documentation

## 2020-11-18 DIAGNOSIS — M4802 Spinal stenosis, cervical region: Secondary | ICD-10-CM | POA: Diagnosis not present

## 2020-11-18 DIAGNOSIS — M5090 Cervical disc disorder, unspecified, unspecified cervical region: Secondary | ICD-10-CM | POA: Diagnosis not present

## 2020-11-18 LAB — CBC
HCT: 43.9 % (ref 36.0–46.0)
Hemoglobin: 14.9 g/dL (ref 12.0–15.0)
MCH: 29.7 pg (ref 26.0–34.0)
MCHC: 33.9 g/dL (ref 30.0–36.0)
MCV: 87.5 fL (ref 80.0–100.0)
Platelets: 284 10*3/uL (ref 150–400)
RBC: 5.02 MIL/uL (ref 3.87–5.11)
RDW: 13.1 % (ref 11.5–15.5)
WBC: 6.7 10*3/uL (ref 4.0–10.5)
nRBC: 0 % (ref 0.0–0.2)

## 2020-11-18 LAB — DIFFERENTIAL
Abs Immature Granulocytes: 0.03 10*3/uL (ref 0.00–0.07)
Basophils Absolute: 0.1 10*3/uL (ref 0.0–0.1)
Basophils Relative: 1 %
Eosinophils Absolute: 0.1 10*3/uL (ref 0.0–0.5)
Eosinophils Relative: 2 %
Immature Granulocytes: 0 %
Lymphocytes Relative: 27 %
Lymphs Abs: 1.8 10*3/uL (ref 0.7–4.0)
Monocytes Absolute: 0.5 10*3/uL (ref 0.1–1.0)
Monocytes Relative: 7 %
Neutro Abs: 4.3 10*3/uL (ref 1.7–7.7)
Neutrophils Relative %: 63 %

## 2020-11-18 LAB — COMPREHENSIVE METABOLIC PANEL
ALT: 25 U/L (ref 0–44)
AST: 29 U/L (ref 15–41)
Albumin: 4.7 g/dL (ref 3.5–5.0)
Alkaline Phosphatase: 72 U/L (ref 38–126)
Anion gap: 12 (ref 5–15)
BUN: 8 mg/dL (ref 8–23)
CO2: 24 mmol/L (ref 22–32)
Calcium: 9.8 mg/dL (ref 8.9–10.3)
Chloride: 102 mmol/L (ref 98–111)
Creatinine, Ser: 0.76 mg/dL (ref 0.44–1.00)
GFR, Estimated: >60 mL/min (ref >60)
Glucose, Bld: 107 mg/dL — ABNORMAL HIGH (ref 70–99)
Potassium: 4.3 mmol/L (ref 3.5–5.1)
Sodium: 138 mmol/L (ref 135–145)
Total Bilirubin: 0.4 mg/dL (ref 0.3–1.2)
Total Protein: 7.8 g/dL (ref 6.5–8.1)

## 2020-11-18 LAB — I-STAT CHEM 8, ED
BUN: 11 mg/dL (ref 8–23)
Calcium, Ion: 1.23 mmol/L (ref 1.15–1.40)
Chloride: 102 mmol/L (ref 98–111)
Creatinine, Ser: 0.7 mg/dL (ref 0.44–1.00)
Glucose, Bld: 101 mg/dL — ABNORMAL HIGH (ref 70–99)
HCT: 46 % (ref 36.0–46.0)
Hemoglobin: 15.6 g/dL — ABNORMAL HIGH (ref 12.0–15.0)
Potassium: 4.3 mmol/L (ref 3.5–5.1)
Sodium: 139 mmol/L (ref 135–145)
TCO2: 27 mmol/L (ref 22–32)

## 2020-11-18 LAB — PROTIME-INR
INR: 1 (ref 0.8–1.2)
Prothrombin Time: 12.5 seconds (ref 11.4–15.2)

## 2020-11-18 LAB — APTT: aPTT: 29 seconds (ref 24–36)

## 2020-11-18 IMAGING — CT CT HEAD W/O CM
4 series · 17 of 47 positions shown, 19 images · non-contrast
Comparison: None.

CLINICAL DATA: Dizziness, headache.

EXAM:
CT HEAD WITHOUT CONTRAST
TECHNIQUE: Contiguous axial images were obtained from the base of the skull
through the vertex without intravenous contrast.

[Series 3: head without · axial · non-contrast · 0.41mm/px · z∈[-144,-14]mm · 7 of 36 slices shown, 9 images]
[im 5/36  brain]
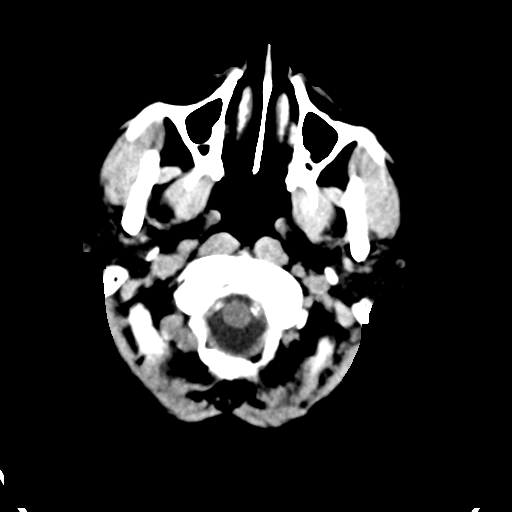
[im 5/36  bone]
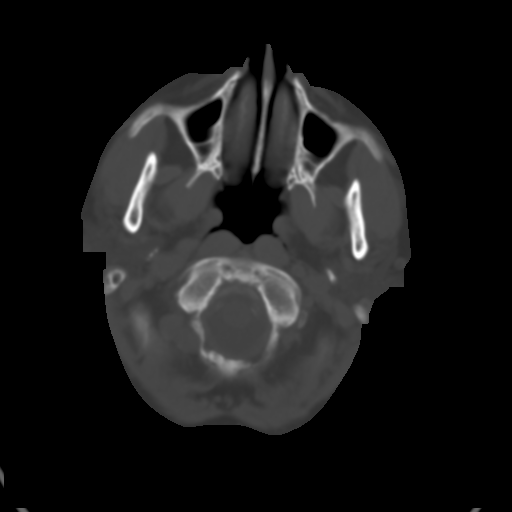
[im 9/36  brain]
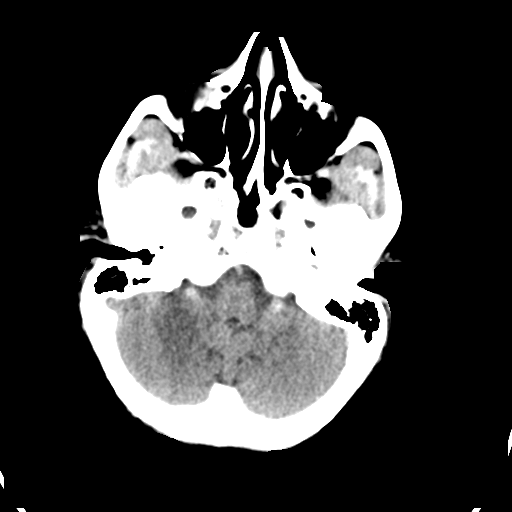
[im 14/36  brain]
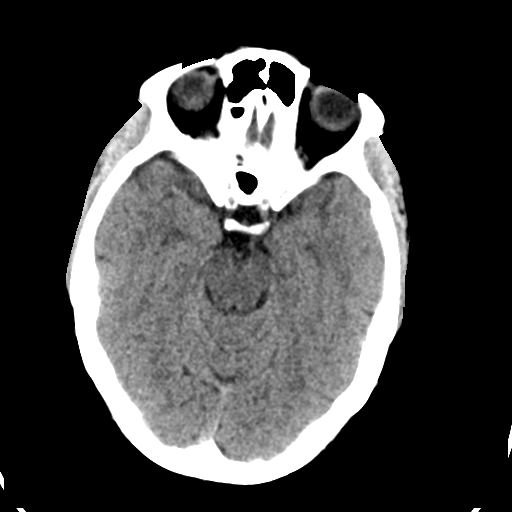
[im 18/36  brain]
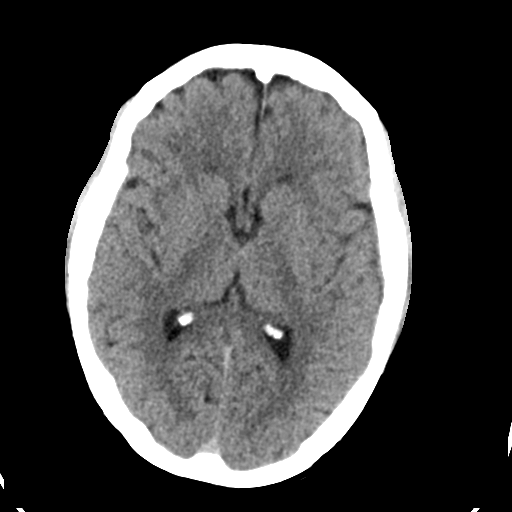
[im 22/36  brain]
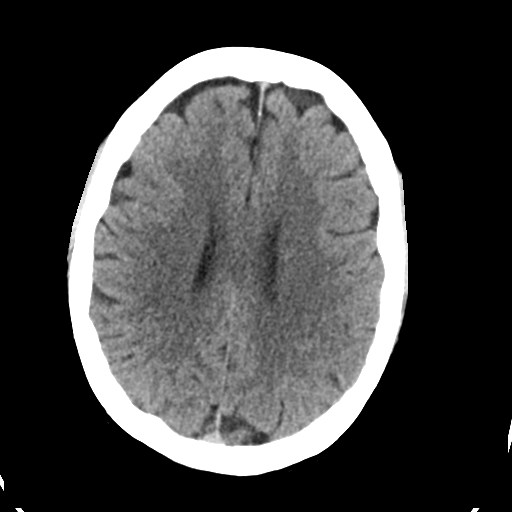
[im 22/36  bone]
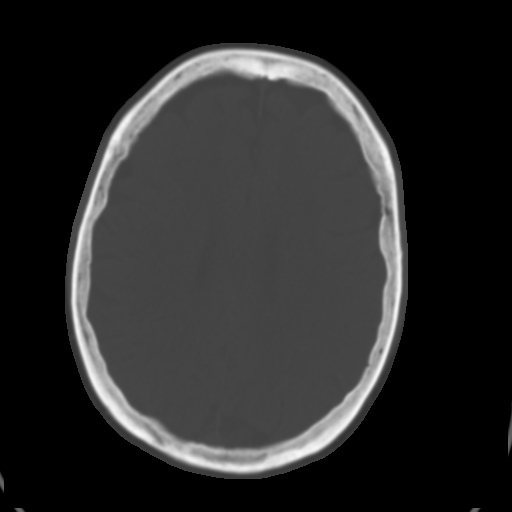
[im 27/36  brain]
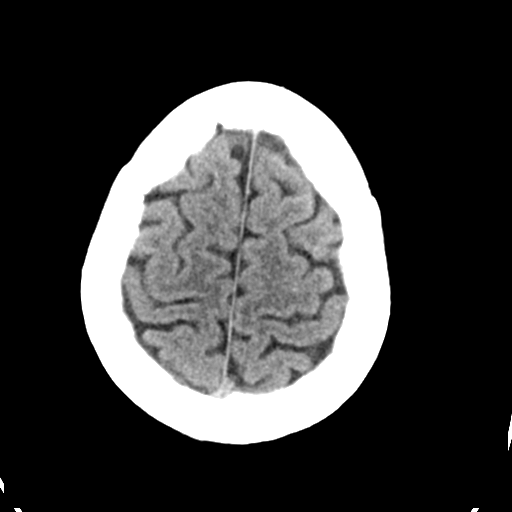
[im 31/36  brain]
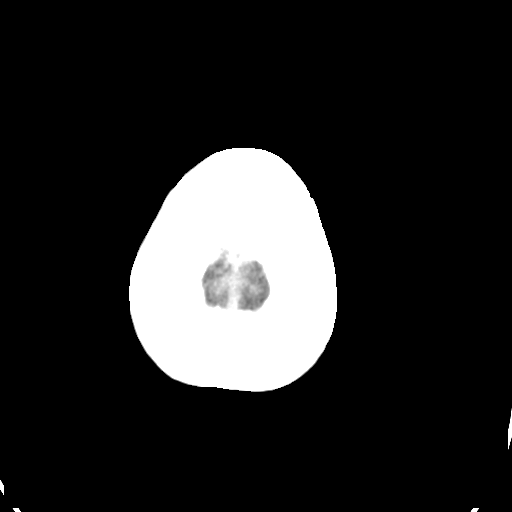

[Series 4: head bone · axial · 0.41mm/px · z∈[-148,-86]mm · 4 of 89 slices shown]
[im 9/89  bone]
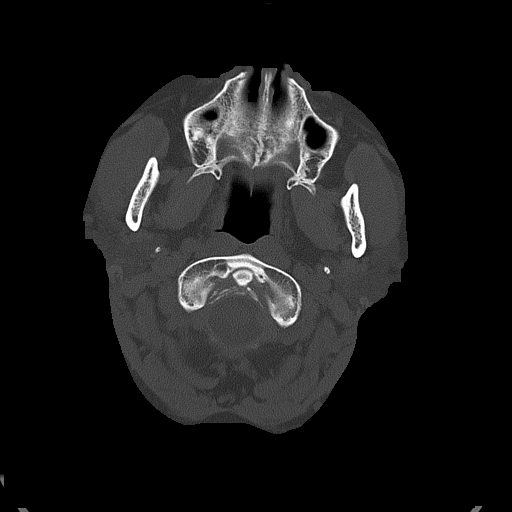
[im 18/89  bone]
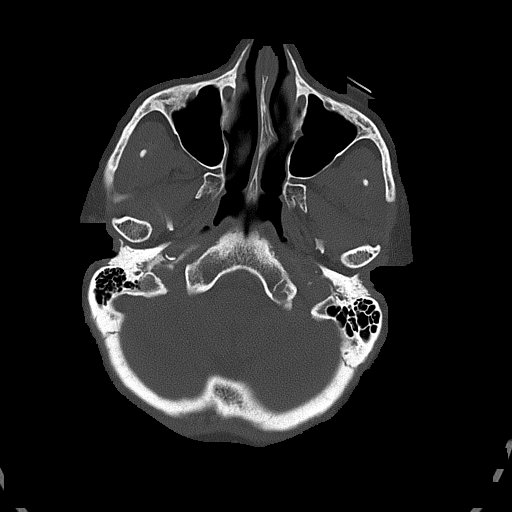
[im 27/89  bone]
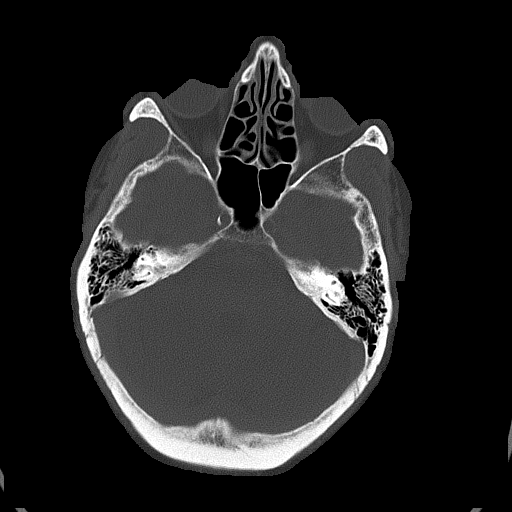
[im 40/89  bone]
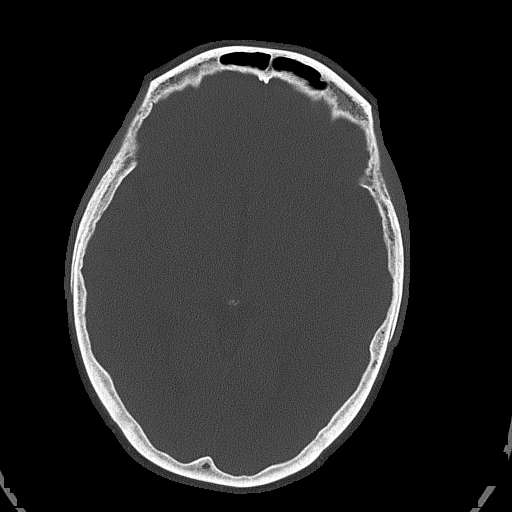

[Series 5: head without cor · coronal · non-contrast · 0.35mm/px · 3 of 73 slices shown]
[im 25/73  brain]
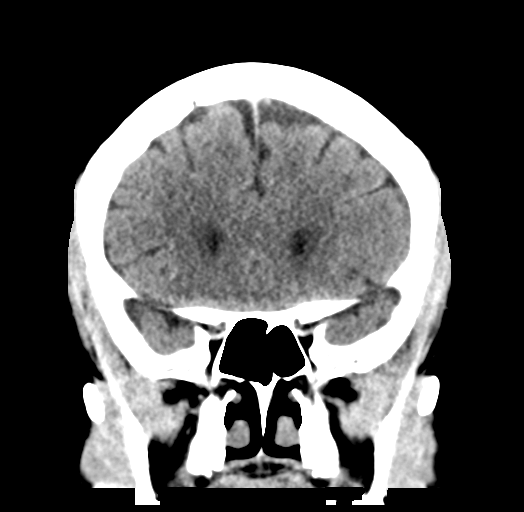
[im 33/73  brain]
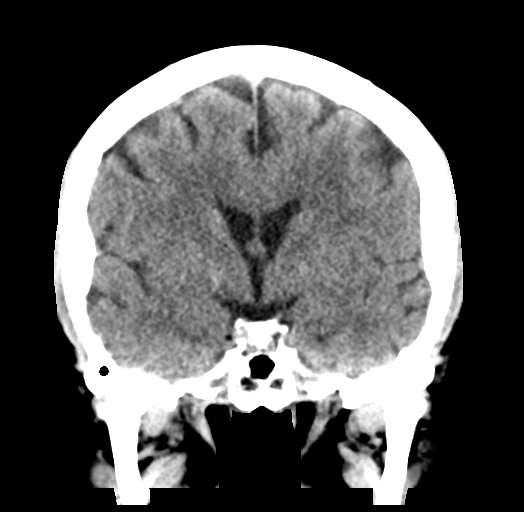
[im 41/73  brain]
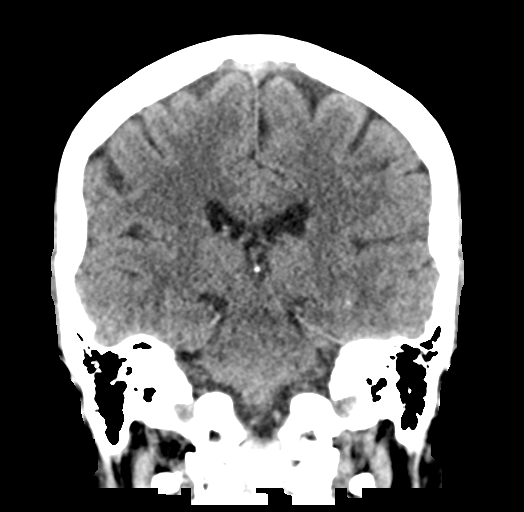

[Series 6: head without sag · sagittal · non-contrast · 0.35mm/px · 3 of 59 slices shown]
[im 20/59  brain]
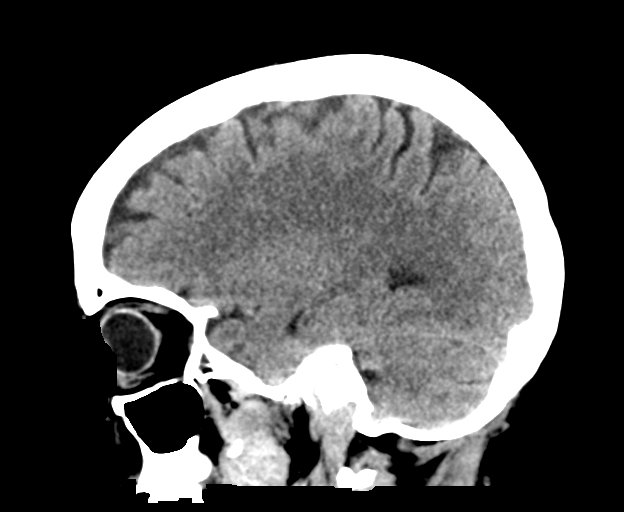
[im 30/59  brain]
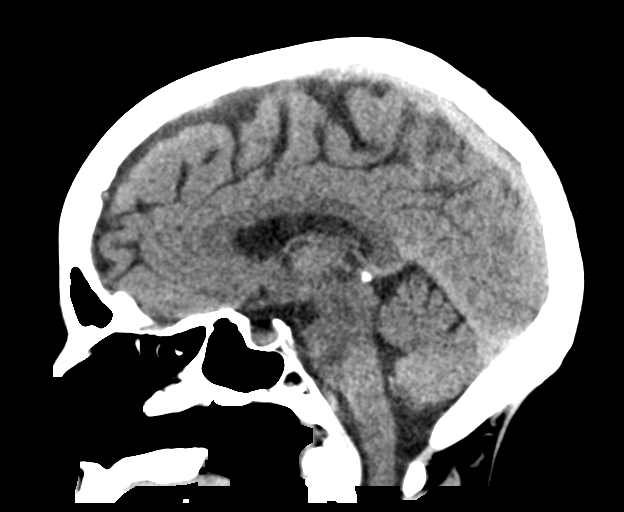
[im 39/59  brain]
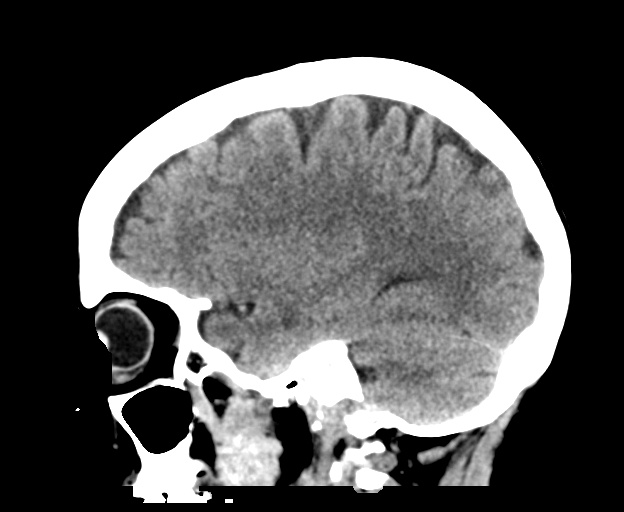

[17 of 47 positions shown; findings below may reference images not displayed]

FINDINGS: Brain: No evidence of acute infarction, hemorrhage, hydrocephalus,
extra-axial collection or mass lesion/mass effect.

Vascular: No hyperdense vessel or unexpected calcification.

Skull: Normal. Negative for fracture or focal lesion.

Sinuses/Orbits: No acute finding.

Other: None.
IMPRESSION: Normal head CT.

## 2020-11-18 MED ORDER — SODIUM CHLORIDE 0.9% FLUSH
3.0000 mL | Freq: Once | INTRAVENOUS | Status: AC
Start: 2020-11-18 — End: 2020-11-19
  Administered 2020-11-19: 3 mL via INTRAVENOUS

## 2020-11-18 NOTE — ED Triage Notes (Signed)
Pt c/o intermittent headaches, dizziness and gait issues. States she has been having these "episodes" since Nov. 2021 and has been seen by her PCP for same, states episodes today have felt more severe. Pt reports improvement to symptoms at this time.

## 2020-11-19 ENCOUNTER — Emergency Department (HOSPITAL_COMMUNITY): Payer: Medicare Other

## 2020-11-19 ENCOUNTER — Encounter (HOSPITAL_COMMUNITY): Payer: Self-pay | Admitting: Neurology

## 2020-11-19 DIAGNOSIS — R519 Headache, unspecified: Secondary | ICD-10-CM | POA: Diagnosis not present

## 2020-11-19 DIAGNOSIS — R2681 Unsteadiness on feet: Secondary | ICD-10-CM

## 2020-11-19 DIAGNOSIS — M5090 Cervical disc disorder, unspecified, unspecified cervical region: Secondary | ICD-10-CM | POA: Diagnosis not present

## 2020-11-19 IMAGING — MR MR HEAD W/O CM
14 of 16 series · 36 of 48 positions shown · non-contrast
Comparison: None.

CLINICAL DATA: Intermittent headaches with dizziness

EXAM:
MRI HEAD WITHOUT CONTRAST
TECHNIQUE: Multiplanar, multiecho pulse sequences of the brain and surrounding
structures were obtained without intravenous contrast.

[Series 5: DWI · axial · 3.0mm · 0.85mm/px · z∈[-87,+60]mm · 5 of 100 slices shown (1 of 4)]
[im 1/100]
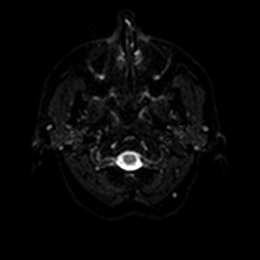
[im 25/100]
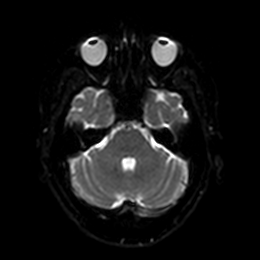
[im 50/100]
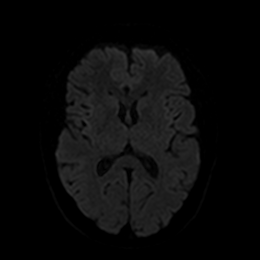
[im 75/100]
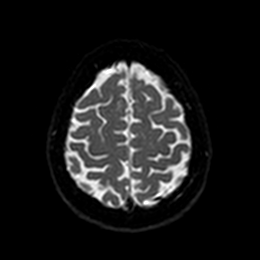
[im 100/100]
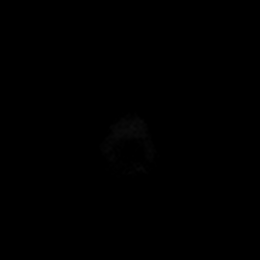

[Series 6: DWI · axial · 3.0mm · 0.85mm/px · z∈[-87,+60]mm · 2 of 49 slices shown (2 of 4)]
[im 1/49]
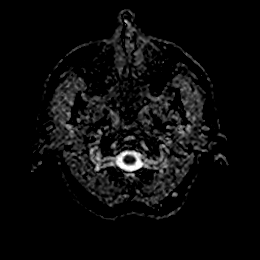
[im 49/49]
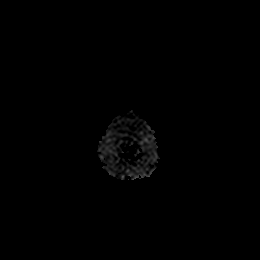

[Series 7: DWI · coronal · 4.0mm · 0.88mm/px · 3 of 66 slices shown (3 of 4)]
[im 1/66]
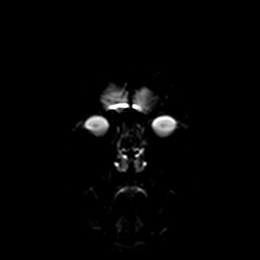
[im 33/66]
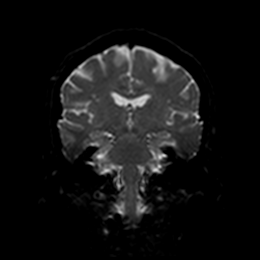
[im 66/66]
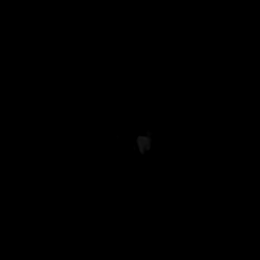

[Series 8: DWI · coronal · 4.0mm · 0.88mm/px · 2 of 33 slices shown (4 of 4)]
[im 1/33]
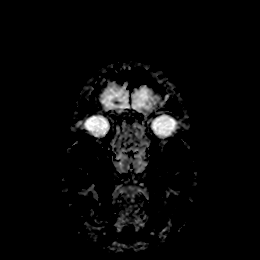
[im 33/33]
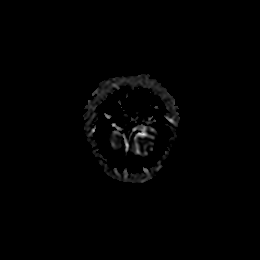

[Series 9: T1 · sagittal · 5.0mm · 0.72mm/px · 1 of 25 slices shown]
[im 1/25]
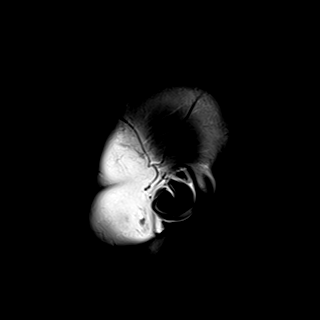

[Series 10: T2 · axial · 5.0mm · 0.69mm/px · 1 of 25 slices shown (1 of 2)]
[im 1/25]
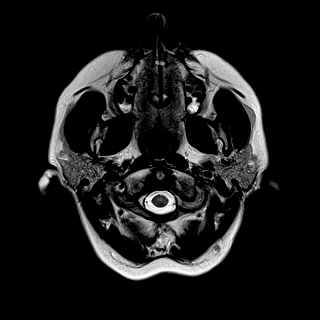

[Series 11: FLAIR · axial · 5.0mm · 0.43mm/px · 1 of 25 slices shown]
[im 1/25]
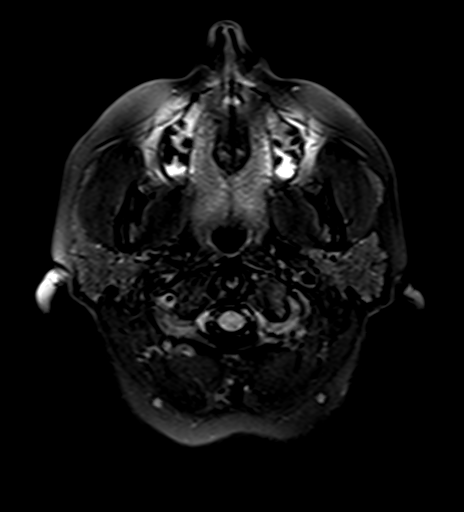

[Series 12: mag_images · axial · 3.0mm · 0.86mm/px · z∈[-93,+60]mm · 3 of 52 slices shown]
[im 1/52]
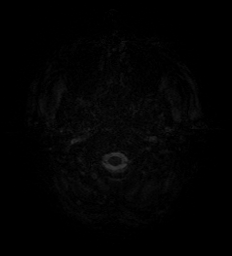
[im 26/52]
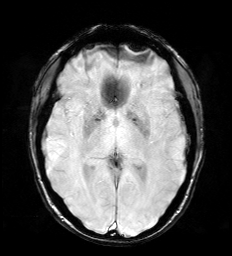
[im 52/52]
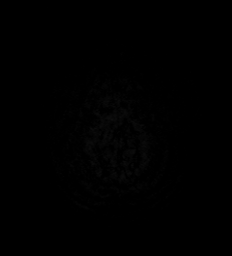

[Series 13: pha_images · axial · 3.0mm · 0.86mm/px · z∈[-93,+60]mm · 3 of 52 slices shown]
[im 1/52]
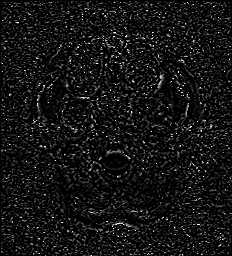
[im 26/52]
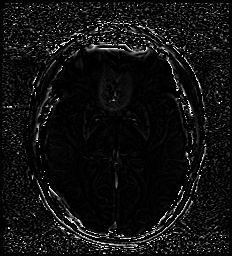
[im 52/52]
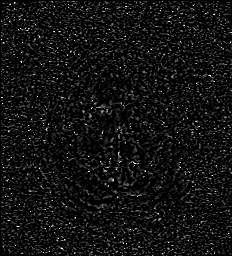

[Series 14: swi_images · axial · 3.0mm · 0.86mm/px · z∈[-93,+60]mm · 3 of 52 slices shown]
[im 1/52]
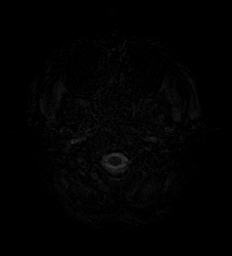
[im 26/52]
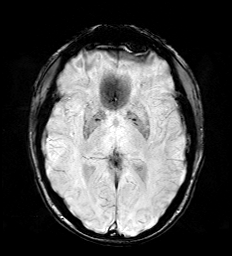
[im 52/52]
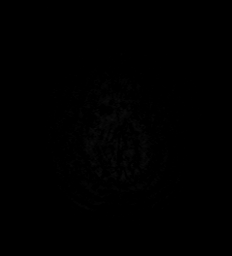

[Series 15: mip_images(sw) · axial · 24.0mm · 0.86mm/px · z∈[-82,+49]mm · 2 of 45 slices shown]
[im 1/45]
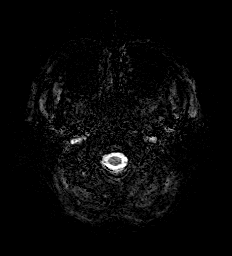
[im 45/45]
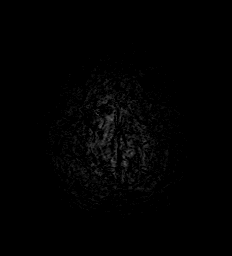

[Series 17: T2 · coronal · 5.0mm · 0.34mm/px · 1 of 29 slices shown (2 of 2)]
[im 1/29]
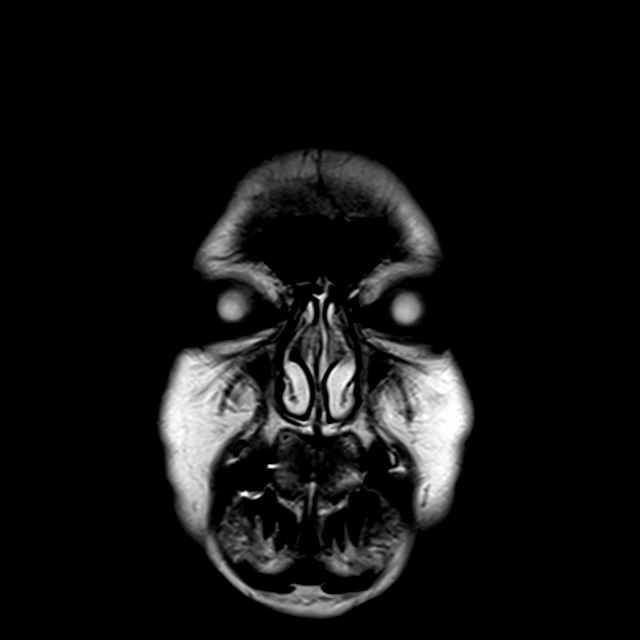

[Series 18: t2_space_dark-fluid_sag_p2_ns-ir · sagittal · 1.0mm · 0.45mm/px · 8 of 160 slices shown]
[im 1/160]
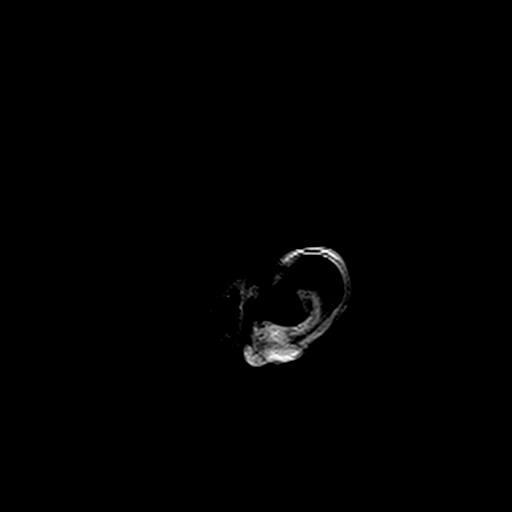
[im 23/160]
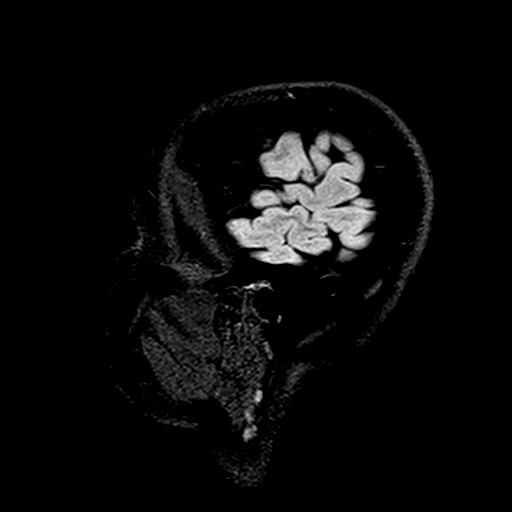
[im 46/160]
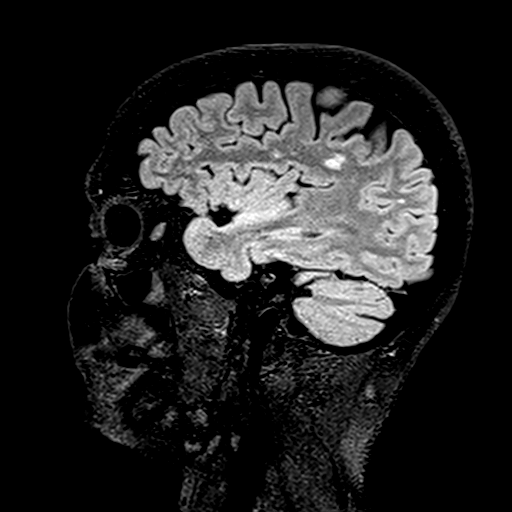
[im 69/160]
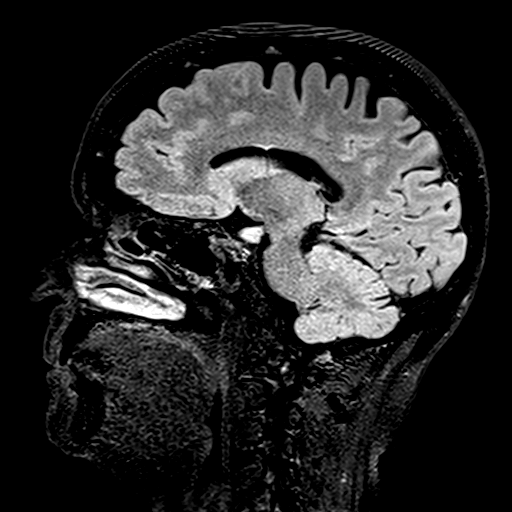
[im 91/160]
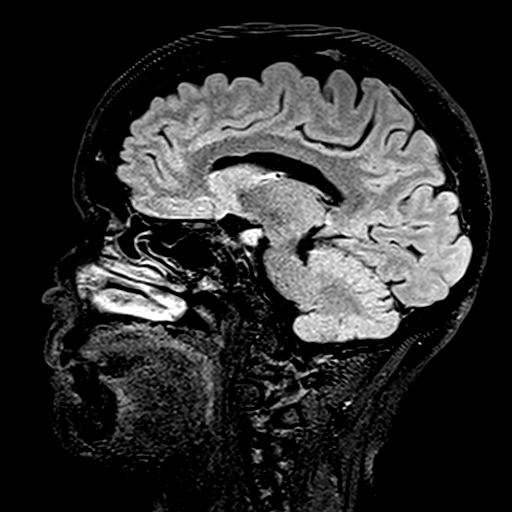
[im 114/160]
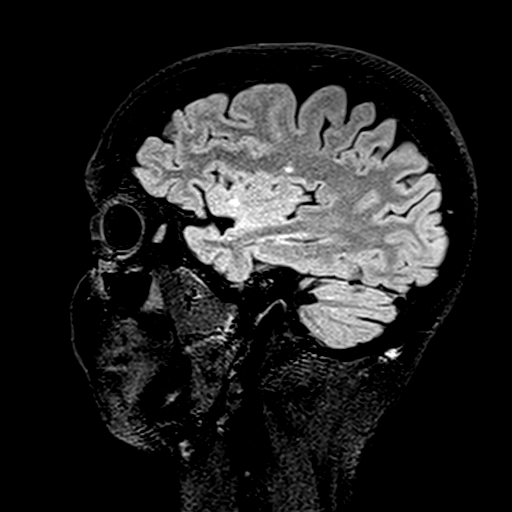
[im 137/160]
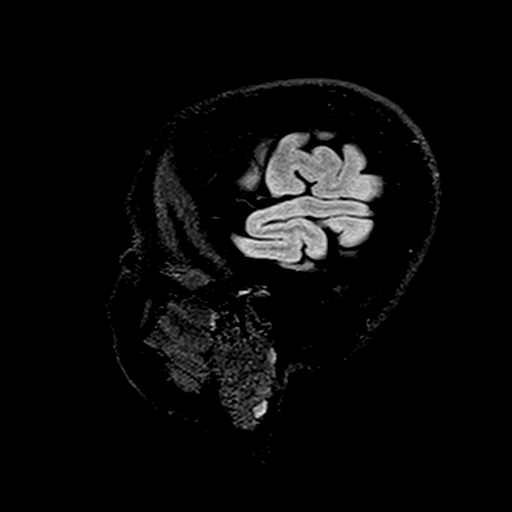
[im 160/160]
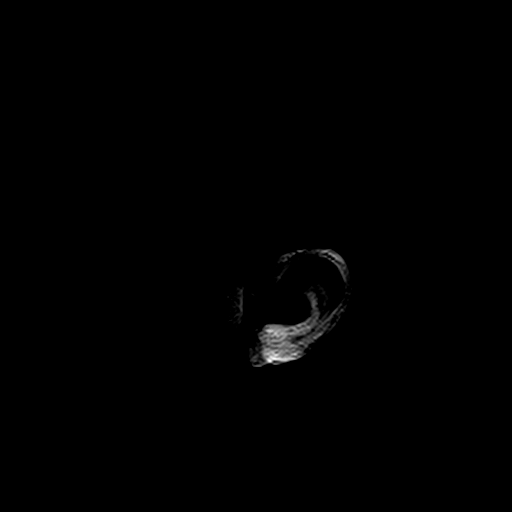

[Series 19: t2_space_dark-fluid_sag_p2_ns-ir_mpr_ axial · axial · 2.0mm · 0.42mm/px · 1 of 90 slices shown]
[im 1/90]
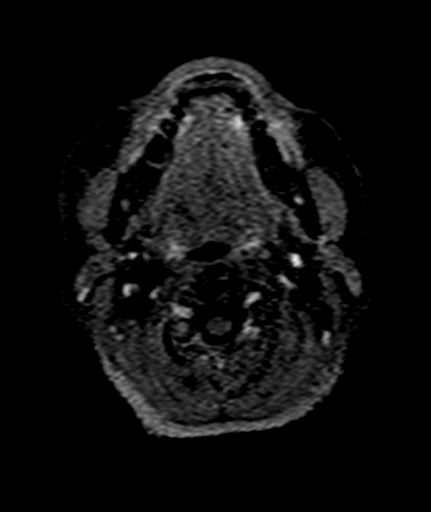

[36 of 48 positions shown; findings below may reference images not displayed]

FINDINGS: Brain: No acute infarct, mass effect or extra-axial collection. No
acute or chronic hemorrhage. There is multifocal hyperintense
T2-weighted signal within the white matter. Parenchymal volume and
CSF spaces are normal. Prominent focus of hyperintense T2-weighted
signal within the anterior right parietal white matter ([DATE]) with
faint hyperintensity on diffusion-weighted imaging. There is
corresponding low T1-weighted signal at the site. The lesion also
shows mild contrast enhancement.

Vascular: Major flow voids are preserved.

Skull and upper cervical spine: Normal calvarium and skull base.
Visualized upper cervical spine and soft tissues are normal.

Sinuses/Orbits:No paranasal sinus fluid levels or advanced mucosal
thickening. No mastoid or middle ear effusion. Normal orbits.
IMPRESSION: 1. No acute intracranial abnormality.
2. Anterior right parietal white matter lesion with mild associated
contrast enhancement, concerning for active demyelination.

## 2020-11-19 IMAGING — MR MR MRV HEAD WO/W CM
5 series · 44 of 48 positions shown · IV contrast (Gadavist)
Comparison: None.

CLINICAL DATA: Headaches and dizziness

EXAM:
MR VENOGRAM HEAD WITHOUT AND WITH CONTRAST
TECHNIQUE: Angiographic images of the intracranial venous structures were
obtained using MRV technique without and with intravenous contrast.
CONTRAST:  6.5mL GADAVIST GADOBUTROL 1 MMOL/ML IV SOLN

[Series 1: tof_fl2d_paracor · coronal · 2.0mm · 0.98mm/px · 12 of 150 slices shown]
[im 1/150]
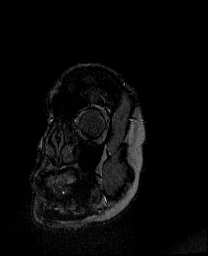
[im 14/150]
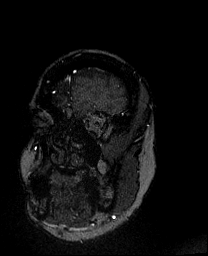
[im 28/150]
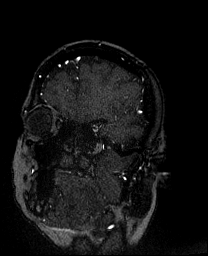
[im 41/150]
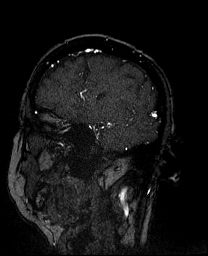
[im 55/150]
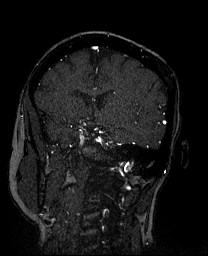
[im 68/150]
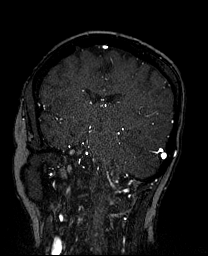
[im 82/150]
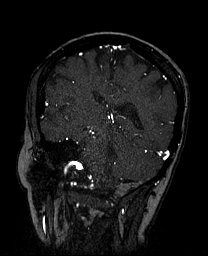
[im 95/150]
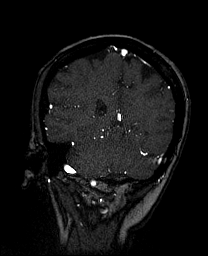
[im 109/150]
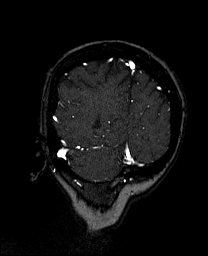
[im 122/150]
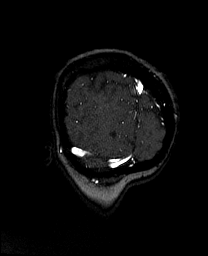
[im 136/150]
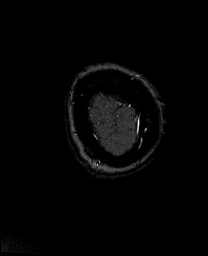
[im 150/150]
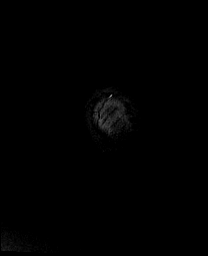

[Series 5: tof_fl2d_paracor post · oblique · 2.5mm · 0.98mm/px · 8 of 95 slices shown]
[im 1/95]
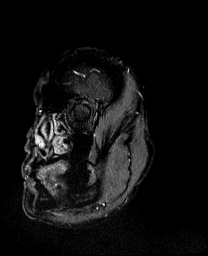
[im 14/95]
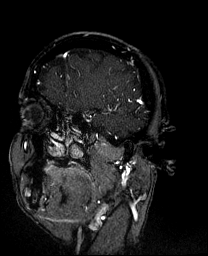
[im 27/95]
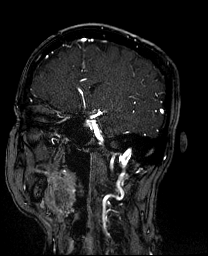
[im 41/95]
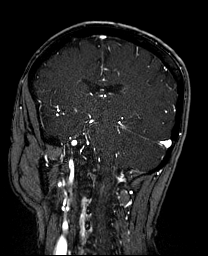
[im 54/95]
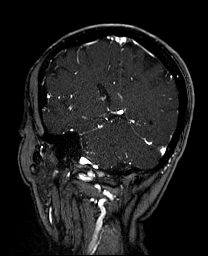
[im 68/95]
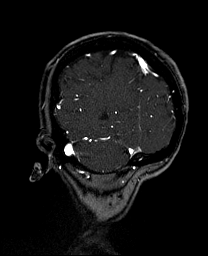
[im 81/95]
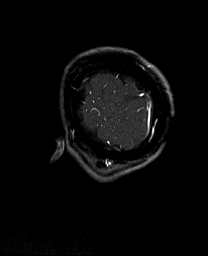
[im 95/95]
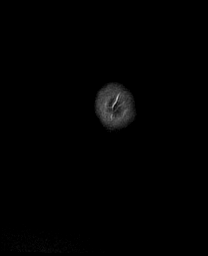

[Series 9: t1_fl3d_sag_p2_iso · sagittal · 1.0mm · 0.90mm/px · 9 of 160 slices shown]
[im 1/160]
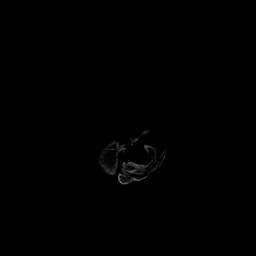
[im 27/160]
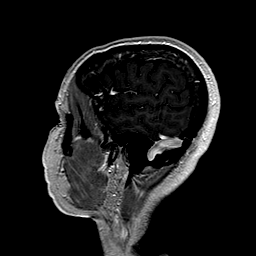
[im 54/160]
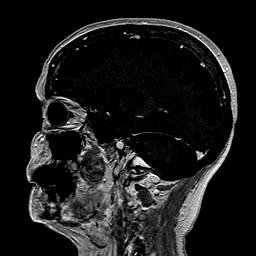
[im 67/160]
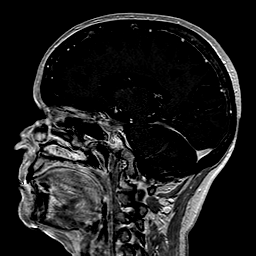
[im 80/160]
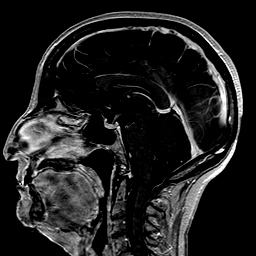
[im 93/160]
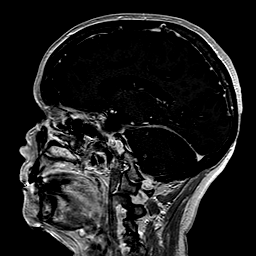
[im 107/160]
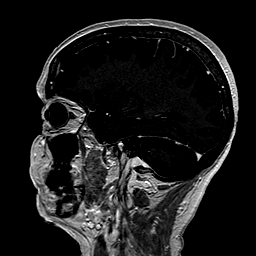
[im 133/160]
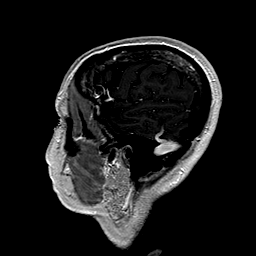
[im 160/160]
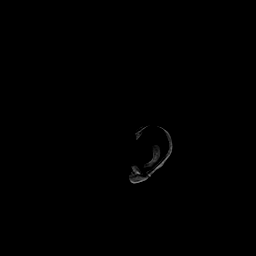

[Series 10: t1_fl3d_sag_p2_iso_mpr_ axial · axial · 2.0mm · 0.42mm/px · z∈[-108,+70]mm · 7 of 90 slices shown]
[im 1/90]
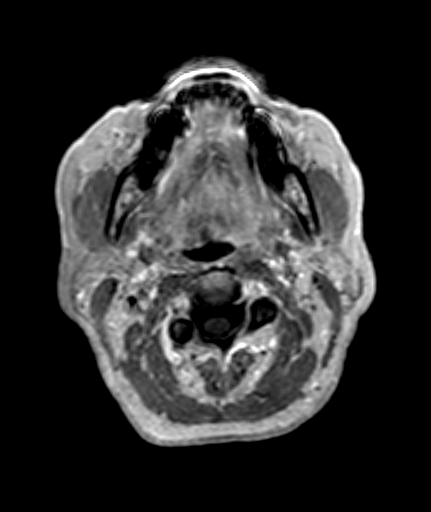
[im 15/90]
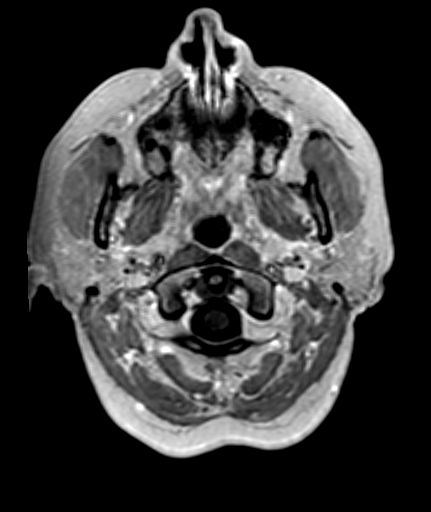
[im 30/90]
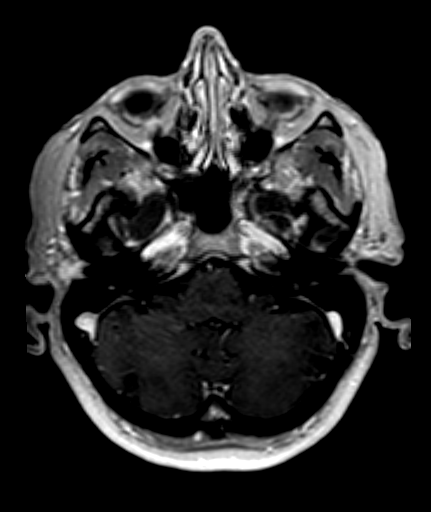
[im 45/90]
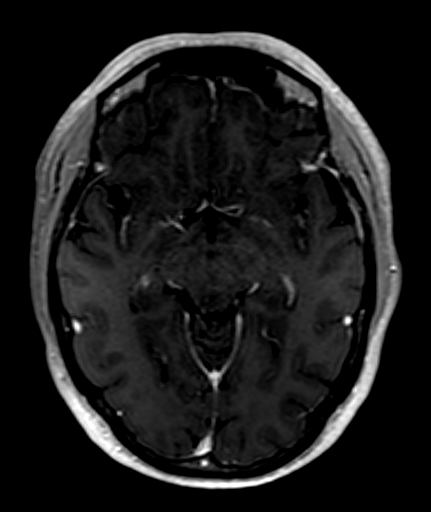
[im 60/90]
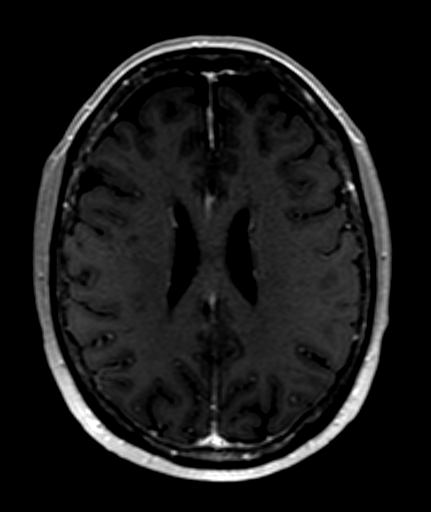
[im 75/90]
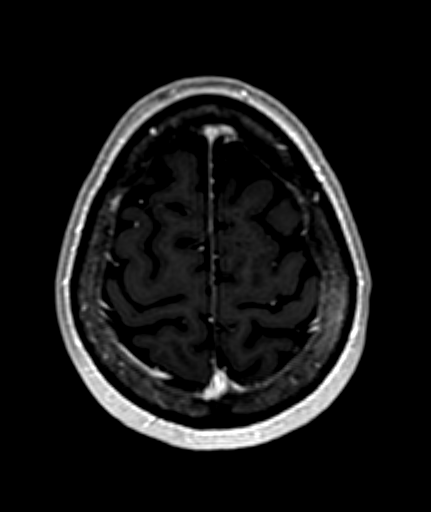
[im 90/90]
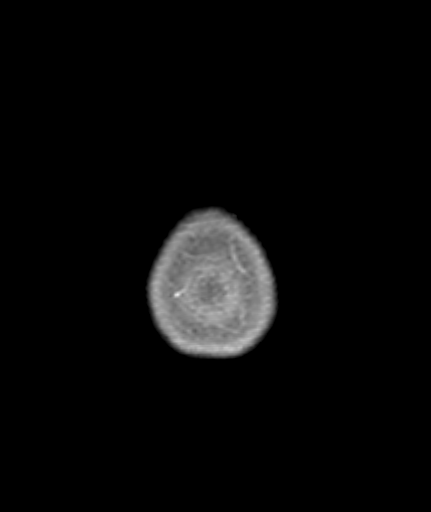

[Series 11: t1_fl3d_sag_p2_iso_mpr_coronal · coronal · 2.0mm · 0.45mm/px · 8 of 94 slices shown]
[im 1/94]
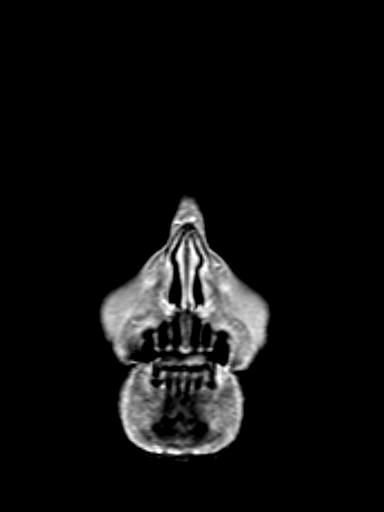
[im 14/94]
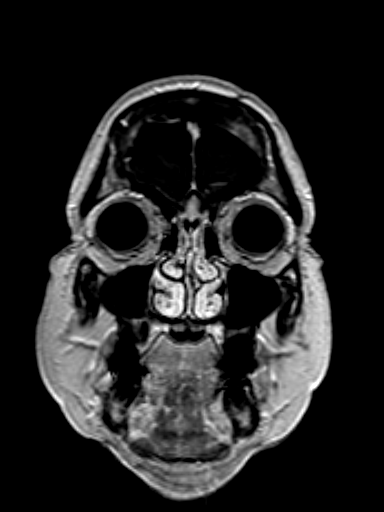
[im 27/94]
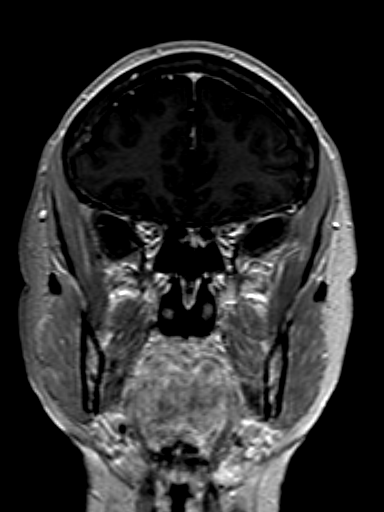
[im 40/94]
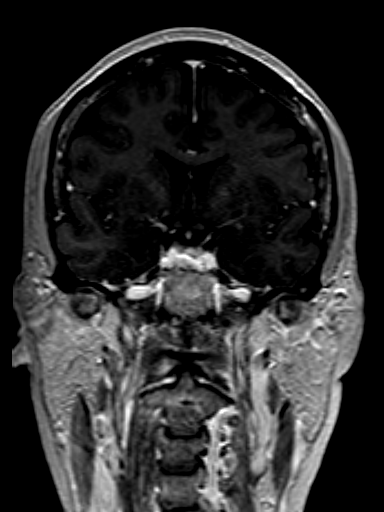
[im 54/94]
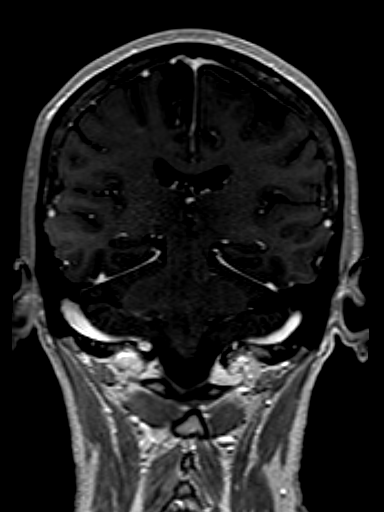
[im 67/94]
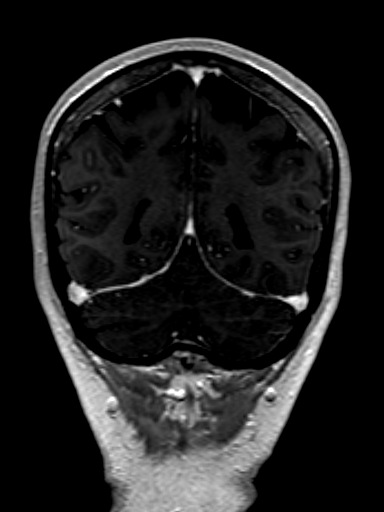
[im 80/94]
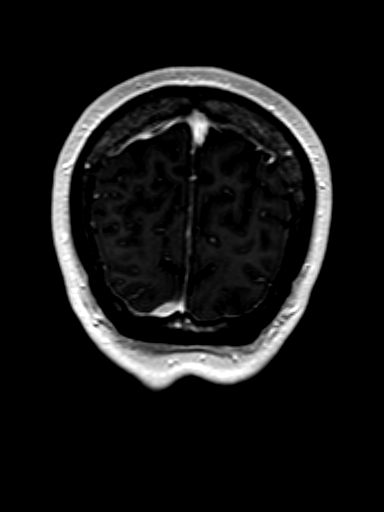
[im 94/94]
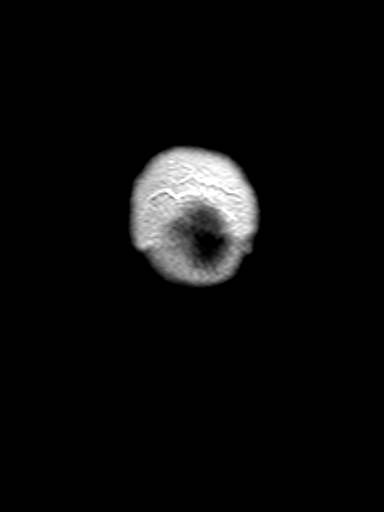

[44 of 48 positions shown; findings below may reference images not displayed]

FINDINGS: Superior sagittal sinus: Normal.

Straight sinus: Normal.

Inferior sagittal sinus, vein of LIENAD and internal cerebral veins:
Normal.

Transverse sinuses: Normal.

Sigmoid sinuses: Normal.

Visualized jugular veins: Normal.
IMPRESSION: Normal MRV of the head.

## 2020-11-19 MED ORDER — GADOBUTROL 1 MMOL/ML IV SOLN
6.5000 mL | Freq: Once | INTRAVENOUS | Status: AC | PRN
  Administered 2020-11-19: 6.5 mL via INTRAVENOUS

## 2020-11-19 NOTE — ED Notes (Signed)
Patient transported to MRI 

## 2020-11-19 NOTE — Discharge Instructions (Addendum)
An order for MRI of your neck was placed. Please call the hospital front directory for assistance on where to present.

## 2020-11-19 NOTE — Consult Note (Signed)
Neurology Consult H&P  CC: unsteadiness  History is obtained from: patient and husband  HPI: Monica Ochoa is a 75 y.o. female right handed PMHx as reviewed below 2 months of gait instability/sensation of feeling off balance. She states that the sensation is always provoked by head movement in any direction and by looking down with her eyes. This can occur both supine and standing and she thinks they are intermittent but always feels off balance when walking. Improved with being still or lying down.  She states that she had benign paroxysmal positional vertigo about 30 years ago and her current symptoms are not the same.  She has associated frontal headache which had been more intenese for the past 2 days. Described moderate to severe, more like a feeling of fullness and perhaps some pressure, non-radiating in frontal region. No alleviating or aggravating factors.   Currently does not have a headache.  Reports being treated for sinus infection by her PCP last month, who also referred her to neurology, and was seen by ENT who did a CT scan that was negative for sinusitis and ruled out vertigo.  She adds that she has a bulging disc at C5-C6 and was seen by neurosurgeon about 15 years ago and was not a surgical candidate.  She does endorse nausea with the symptoms but no emesis. Denies any recent illness, fevers, chills, chest pain or shortness of breat.    ROS: A complete ROS was performed and is negative except as noted in the HPI.   Past Medical History:  Diagnosis Date  . Diabetes mellitus without complication ([place])   . Female bladder prolapse   . Hyperlipidemia 02/14/2016  . Hypertension   . Intermittent palpitations    03-23-2018 per pt caused due to anxiety/ upset  . Osteopenia 06/2019   T score -2.1 FRAX 13% / 3.3% overall stable from prior DEXA  . Palpitations   . PVC's (premature ventricular contractions) 03/17/2016  . Wears glasses    Family History  Problem Relation Age  of Onset  . Hypertension Mother   . Cervical cancer Mother   . Heart disease Mother   . Uterine cancer Mother   . Angina Mother   . Diabetes Father   . Other Father        black lung  . Hypertension Brother   . Diabetes Brother   . Hypertension Sister   . Heart Problems Sister        valve problems  . Crohn's disease Sister   . Breast cancer Neg Hx     Social History:  reports that she has never smoked. She has never used smokeless tobacco. She reports that she does not drink alcohol and does not use drugs.   Prior to Admission medications   Medication Sig Start Date End Date Taking? Authorizing Provider  ALPRAZolam Duanne Moron) 0.25 MG tablet Take 0.25 mg by mouth at bedtime as needed for anxiety. Patient not taking: Reported on 11/06/2020    [provider]  atorvastatin (LIPITOR) 10 MG tablet Take 10 mg by mouth every evening.  Patient not taking: Reported on 11/06/2020    [provider]  atorvastatin (LIPITOR) 10 MG tablet Take 10 mg by mouth daily.    [provider]  calcium carbonate (OS-CAL) 600 MG TABS Take 600 mg by mouth 2 (two) times daily with a meal.    [provider]  cholecalciferol (VITAMIN D) 1000 units tablet Take 1 tablet by mouth daily.  [provider]  Co-Enzyme Q-10 100 MG CAPS Take 100 mg by mouth daily.    [provider]  Flaxseed, Linseed, (FLAXSEED OIL) 1000 MG CAPS Take 1 capsule by mouth daily.    [provider]  fluticasone (FLONASE) 50 MCG/ACT nasal spray Place 1 spray into both nostrils daily as needed for allergies or rhinitis.    [provider]  losartan (COZAAR) 25 MG tablet Take 25 mg by mouth daily.    [provider]  Probiotic Product (PROBIOTIC COLON SUPPORT PO) Take 1 capsule by mouth every morning.     [provider]    Exam: Current vital signs: BP (!) 152/73   Pulse 81   Temp 98.2 F (36.8 C) (Oral)   Resp 18   SpO2 95%   Physical Exam   Constitutional: Appears well-developed and well-nourished.  Psych: Affect appropriate to situation Eyes: No scleral injection HENT: No OP obstrucion Head: Normocephalic.  Cardiovascular: Normal rate and regular rhythm.  Respiratory: Effort normal and breath sounds normal to anterior ascultation GI: Soft.  No distension. There is no tenderness.  Skin: WDI  Neuro: Mental Status: Patient is awake, alert, oriented to person, place, month, year, and situation. Patient is able to give a clear and coherent history. No signs of aphasia or neglect. Cranial Nerves: II: Visual Fields are full. Pupils are equal, round, and reactive to light. III,IV, VI: EOMI without ptosis or diploplia.  V: Facial sensation is symmetric to temperature VII: Facial movement is symmetric.  VIII: hearing is intact to voice X: Uvula elevates symmetrically XI: Shoulder shrug is symmetric. XII: tongue is midline without atrophy or fasciculations.  Motor: Tone is normal. Bulk is normal. 5/5 strength was present in all four extremities. Sensory: Sensation is symmetric to light touch and temperature in the arms and legs. Deep Tendon Reflexes: 2+ and symmetric in the biceps and patellae. Plantars: Toes are downgoing bilaterally. Cerebellar: FNF and HKS are intact bilaterally.  I have reviewed labs in epic and the pertinent results are:   Ref. Range 11/18/2020 18:28  Sodium Latest Ref Range: 135 - 145 mmol/L 139  Potassium Latest Ref Range: 3.5 - 5.1 mmol/L 4.3  Chloride Latest Ref Range: 98 - 111 mmol/L 102  Glucose Latest Ref Range: 70 - 99 mg/dL 884 (H)  BUN Latest Ref Range: 8 - 23 mg/dL 11  Creatinine Latest Ref Range: 0.44 - 1.00 mg/dL 1.66    I have reviewed the images obtained: NCT head showed No evidence of acute infarction, hemorrhage, hydrocephalus, extra-axial collection or mass lesion/mass effect. MRI brain wo/w showed No acute intracranial abnormality. Anterior right parietal white matter lesion  with mild associated contrast enhancement, concerning for active demyelination. Normal MRV of the head  Assessment: GEMMA RUAN is a 75 y.o. female right handed PMHx bulging disc C5-C6, remote BPPV, pre-diabetic and mild HTN with 2 month sensation of unsteadiness which is intermittent but always feels off balance. The intermittent nature of her symptoms is not suggestive of stroke. MRI showed anterior right parietal white matter lesion with mild enhancement suggesting of possible demyelinating lesion. Exam did not show ataxia in upper extremities and sensation was intact. Though unable to reproduce symptoms on exam, her complaint is of symptoms being provoked by head movement in all planes of motion. It is possible that her bulging disc at the C5-C6 level has progressed which may produce cervicogenic dizziness. Recommended MRI cervical spine and discussed demyelinating workup with patient. She mentioned that she has  an upcoming neurology appointment which could be moved up if MRI showed anything and preferred to have MRI and further workup as outpatient. Advised to call her PCP to discuss with neurology to coordinate. Discussed with ED and PCP is in [facility] system and will order MRI and message PCP.  Electronically signed by: Dr. Lynnae Sandhoff Pager: [phone] 11/19/2020, 4:45 AM

## 2020-11-19 NOTE — ED Provider Notes (Signed)
[facility] Provider Note  CSN: [id number] Arrival date & time: 11/18/20 1644  Chief Complaint(s) Headache  HPI Monica Ochoa is a 75 y.o. female with a past medical history listed below who presents to the emergency department with 2 months of gait instability/sensation of feeling off balance. Patient notices the symptoms when she is upright. Reports that she thinks they are intermittent but always feels off balance when walking. Symptoms do worsen with head movements or change in position. Improved with being still or lying down. She denies any recent fevers.  Reports being treated for sinus infection by her PCP last month. Reports seeing a ENT doctor who did a CT scan that was negative for sinusitis. She is currently awaiting follow-up with neurology and scheduling an MRI. For the past 2 days patient reports intermittent frontal headache that is moderate to severe.  No alleviating or aggravating factors.  Currently does not have a headache. She denies any associated chest pain or shortness of breath. She does endorse nausea with the symptoms but no emesis. No diarrhea. No bloody bowel movements. No other physical complaints HPI  Past Medical History Past Medical History:  Diagnosis Date  . Diabetes mellitus without complication ([facility])   . Female bladder prolapse   . Hyperlipidemia 02/14/2016  . Hypertension   . Intermittent palpitations    03-23-2018 per pt caused due to anxiety/ upset  . Osteopenia 06/2019   T score -2.1 FRAX 13% / 3.3% overall stable from prior DEXA  . Palpitations   . PVC's (premature ventricular contractions) 03/17/2016  . Wears glasses    Patient Active Problem List   Diagnosis Date Noted  . Female cystocele 11/06/2020  . History of total vaginal hysterectomy (TVH) 11/06/2020  . PVC's (premature ventricular contractions) 03/17/2016  . Atypical chest pain 02/16/2016  . Palpitations 02/14/2016  .  Hyperlipidemia 02/14/2016  . Osteopenia 09/15/2013  . Vaginal atrophy 04/20/2012  . Fecal incontinence 04/20/2012   Home Medication(s) Prior to Admission medications   Medication Sig Start Date End Date Taking? Authorizing Provider  ALPRAZolam Duanne Moron) 0.25 MG tablet Take 0.25 mg by mouth at bedtime as needed for anxiety. Patient not taking: Reported on 11/06/2020    [provider]  atorvastatin (LIPITOR) 10 MG tablet Take 10 mg by mouth every evening.  Patient not taking: Reported on 11/06/2020    [provider]  atorvastatin (LIPITOR) 10 MG tablet Take 10 mg by mouth daily.    [provider]  calcium carbonate (OS-CAL) 600 MG TABS Take 600 mg by mouth 2 (two) times daily with a meal.    [provider]  cholecalciferol (VITAMIN D) 1000 units tablet Take 1 tablet by mouth daily.    [provider]  Co-Enzyme Q-10 100 MG CAPS Take 100 mg by mouth daily.    [provider]  Flaxseed, Linseed, (FLAXSEED OIL) 1000 MG CAPS Take 1 capsule by mouth daily.    [provider]  fluticasone (FLONASE) 50 MCG/ACT nasal spray Place 1 spray into both nostrils daily as needed for allergies or rhinitis.    [provider]  losartan (COZAAR) 25 MG tablet Take 25 mg by mouth daily.    [provider]  Probiotic Product (PROBIOTIC COLON SUPPORT PO) Take 1 capsule by mouth every morning.     [provider]  Past Surgical History Past Surgical History:  Procedure Laterality Date  . ANAL SPHINCTEROPLASTY  02/04/2011   WITH PERINEORRHAPHY  . BREAST EXCISIONAL BIOPSY Left 1989  . LUMBAR SPINE SURGERY  2000  . TOTAL VAGINAL HYSTERECTOMY  1977   W/  ANTERIOR AND POSTERIOR REPAIR   Family History Family History  Problem Relation Age of Onset  . Hypertension Mother   . Cervical cancer  Mother   . Heart disease Mother   . Uterine cancer Mother   . Angina Mother   . Diabetes Father   . Other Father        black lung  . Hypertension Brother   . Diabetes Brother   . Hypertension Sister   . Heart Problems Sister        valve problems  . Crohn's disease Sister   . Breast cancer Neg Hx     Social History Social History   Tobacco Use  . Smoking status: Never Smoker  . Smokeless tobacco: Never Used  Vaping Use  . Vaping Use: Never used  Substance Use Topics  . Alcohol use: No    Alcohol/week: 0.0 standard drinks  . Drug use: No   Allergies Codeine  Review of Systems Review of Systems All other systems are reviewed and are negative for acute change except as noted in the HPI  Physical Exam Vital Signs  I have reviewed the triage vital signs BP (!) 143/78 (BP Location: Right Arm)   Pulse 73   Temp 98.2 F (36.8 C) (Oral)   Resp 19   SpO2 100%   Physical Exam Vitals reviewed.  Constitutional:      General: She is not in acute distress.    Appearance: She is well-developed and well-nourished. She is not diaphoretic.  HENT:     Head: Normocephalic and atraumatic.     Right Ear: No middle ear effusion. Tympanic membrane is not injected, erythematous or bulging.     Left Ear: A middle ear effusion is present. Tympanic membrane is not injected, erythematous or bulging.     Nose: Nose normal.  Eyes:     General: No scleral icterus.       Right eye: No discharge.        Left eye: No discharge.     Extraocular Movements: EOM normal.     Conjunctiva/sclera: Conjunctivae normal.     Pupils: Pupils are equal, round, and reactive to light.  Cardiovascular:     Rate and Rhythm: Normal rate and regular rhythm.     Heart sounds: No murmur heard. No friction rub. No gallop.   Pulmonary:     Effort: Pulmonary effort is normal. No respiratory distress.     Breath sounds: Normal breath sounds. No stridor. No rales.  Abdominal:     General: There is no  distension.     Palpations: Abdomen is soft.     Tenderness: There is no abdominal tenderness.  Musculoskeletal:        General: No tenderness or edema.     Cervical back: Normal range of motion and neck supple.  Skin:    General: Skin is warm and dry.     Findings: No erythema or rash.  Neurological:     Mental Status: She is alert and oriented to person, place, and time.     Comments: Mental Status:  Alert and oriented to person, place, and time.  Attention and concentration normal.  Speech clear.  Recent memory is intact  Cranial  Nerves:  II Visual Fields: Intact to confrontation. Visual fields intact. III, IV, VI: Pupils equal and reactive to light and near. Full eye movement without nystagmus  V Facial Sensation: Normal. No weakness of masticatory muscles  VII: No facial weakness or asymmetry  VIII Auditory Acuity: Grossly normal  IX/X: The uvula is midline; the palate elevates symmetrically  XI: Normal sternocleidomastoid and trapezius strength  XII: The tongue is midline. No atrophy or fasciculations.   Motor System: Muscle Strength: 5/5 and symmetric in the upper and lower extremities. No pronation or drift.  Muscle Tone: Tone and muscle bulk are normal in the upper and lower extremities.   Reflexes: DTRs: 1+ and symmetrical in all four extremities. No Clonus Coordination: Intact finger-to-nose. No tremor.  Sensation: Intact to light touch..  Gait: ataxic with wide gait.  HINTS Plus: Nystagmus: none  Head impulse: normal Skew: normal Hearing: intact    Psychiatric:        Mood and Affect: Mood and affect normal.     ED Results and Treatments Labs (all labs ordered are listed, but only abnormal results are displayed) Labs Reviewed  COMPREHENSIVE METABOLIC PANEL - Abnormal; Notable for the following components:      Result Value   Glucose, Bld 107 (*)    All other components within normal limits  I-STAT CHEM 8, ED - Abnormal; Notable for the following  components:   Glucose, Bld 101 (*)    Hemoglobin 15.6 (*)    All other components within normal limits  PROTIME-INR  APTT  CBC  DIFFERENTIAL  CBG MONITORING, ED                                                                                                                         EKG  EKG Interpretation  Date/Time:  Monday November 19 2020 00:07:41 EST Ventricular Rate:  75 PR Interval:    QRS Duration: 82 QT Interval:  404 QTC Calculation: 452 R Axis:   54 Text Interpretation: Sinus rhythm NO STEMI. No old tracing to compare Confirmed by Addison Lank [phone]) on 11/19/2020 12:44:35 AM      Radiology CT HEAD WO CONTRAST  Result Date: 11/18/2020 CLINICAL DATA:  Dizziness, headache. EXAM: CT HEAD WITHOUT CONTRAST TECHNIQUE: Contiguous axial images were obtained from the base of the skull through the vertex without intravenous contrast. COMPARISON:  None. FINDINGS: Brain: No evidence of acute infarction, hemorrhage, hydrocephalus, extra-axial collection or mass lesion/mass effect. Vascular: No hyperdense vessel or unexpected calcification. Skull: Normal. Negative for fracture or focal lesion. Sinuses/Orbits: No acute finding. Other: None. IMPRESSION: Normal head CT. Electronically Signed   By: Marijo Conception M.D.   On: 11/18/2020 18:54   MR BRAIN WO CONTRAST  Result Date: 11/19/2020 CLINICAL DATA:  Intermittent headaches with dizziness EXAM: MRI HEAD WITHOUT CONTRAST TECHNIQUE: Multiplanar, multiecho pulse sequences of the brain and surrounding structures were obtained without intravenous contrast. COMPARISON:  None. FINDINGS: Brain: No acute infarct, mass effect or extra-axial  collection. No acute or chronic hemorrhage. There is multifocal hyperintense T2-weighted signal within the white matter. Parenchymal volume and CSF spaces are normal. Prominent focus of hyperintense T2-weighted signal within the anterior right parietal white matter (11:17) with faint hyperintensity on  diffusion-weighted imaging. There is corresponding low T1-weighted signal at the site. The lesion also shows mild contrast enhancement. Vascular: Major flow voids are preserved. Skull and upper cervical spine: Normal calvarium and skull base. Visualized upper cervical spine and soft tissues are normal. Sinuses/Orbits:No paranasal sinus fluid levels or advanced mucosal thickening. No mastoid or middle ear effusion. Normal orbits. IMPRESSION: 1. No acute intracranial abnormality. 2. Anterior right parietal white matter lesion with mild associated contrast enhancement, concerning for active demyelination. Electronically Signed   By: Ulyses Jarred M.D.   On: 11/19/2020 03:12   MR MRV HEAD W WO CONTRAST  Result Date: 11/19/2020 CLINICAL DATA:  Headaches and dizziness EXAM: MR VENOGRAM HEAD WITHOUT AND WITH CONTRAST TECHNIQUE: Angiographic images of the intracranial venous structures were obtained using MRV technique without and with intravenous contrast. CONTRAST:  6.7mL GADAVIST GADOBUTROL 1 MMOL/ML IV SOLN COMPARISON:  None. FINDINGS: Superior sagittal sinus: Normal. Straight sinus: Normal. Inferior sagittal sinus, vein of Galen and internal cerebral veins: Normal. Transverse sinuses: Normal. Sigmoid sinuses: Normal. Visualized jugular veins: Normal. IMPRESSION: Normal MRV of the head. Electronically Signed   By: Ulyses Jarred M.D.   On: 11/19/2020 03:14    Pertinent labs & imaging results that were available during my care of the patient were reviewed by me and considered in my medical decision making (see chart for details).  Medications Ordered in ED Medications  sodium chloride flush (NS) 0.9 % injection 3 mL (3 mLs Intravenous Given 11/19/20 0014)  gadobutrol (GADAVIST) 1 MMOL/ML injection 6.5 mL (6.5 mLs Intravenous Contrast Given 11/19/20 0300)                                                                                                                                     Procedures Procedures  (including critical care time)  Medical Decision Making / ED Course I have reviewed the nursing notes for this encounter and the patient's prior records (if available in EHR or on provided paperwork).   Monica Ochoa was evaluated in Emergency Department on 11/19/2020 for the symptoms described in the history of present illness. She was evaluated in the context of the global COVID-19 pandemic, which necessitated consideration that the patient might be at risk for infection with the SARS-CoV-2 virus that causes COVID-19. Institutional protocols and algorithms that pertain to the evaluation of patients at risk for COVID-19 are in a state of rapid change based on information released by regulatory bodies including the CDC and federal and state organizations. These policies and algorithms were followed during the patient's care in the ED.  Patient presents with gait instability/imbalance. Other than gait neuro exam is reassuring. Hints exam is equivocal. Suspect  labyrinthitis versus cerebellar stroke versus venous sinus thrombosis (due to the reported sinus infection)  Screening labs grossly reassuring without leukocytosis or anemia.  No significant electrolyte derangements or renal insufficiency.  CT head negative.  We will obtain MRI to rule out stroke or venous sinus thrombosis.  Clinical Course as of 11/19/20 0504  Mon Nov 19, 2020  0345 MRI negative for stroke or VST. However it did note a lesion in right parietal lobe concerning for demyelination. Neurology consulted.  [PC]    Clinical Course User Index [PC] Melek Pownall, Amadeo Garnet, MD    Neurology believes that her symptoms maybe related to cervical stenosis, which she has a reported history of. They recommended MRI. Patient would like to pursue this in outpatient setting.  They do not believe the demyelinating lesion is concerning at this time. Their recommendations are pending. Please see their note for  recs.  Order for Amb MRI placed.  Will reach out to her PCP to ensure this is relayed. Final Clinical Impression(s) / ED Diagnoses Final diagnoses:  Ataxia    The patient appears reasonably screened and/or stabilized for discharge and I doubt any other medical condition or other [facility] requiring further screening, evaluation, or treatment in the ED at this time prior to discharge. Safe for discharge with strict return precautions.  Disposition: Discharge  Condition: Good  I have discussed the results, Dx and Tx plan with the patient/family who expressed understanding and agree(s) with the plan. Discharge instructions discussed at length. The patient/family was given strict return precautions who verbalized understanding of the instructions. No further questions at time of discharge.    ED Discharge Orders         Ordered    MR CERVICAL SPINE WO CONTRAST        11/19/20 0458            Follow Up: Aliene Beams, MD [address] [place] [address] [phone]  Call  To schedule an appointment for close follow up     This chart was dictated using voice recognition software.  Despite best efforts to proofread,  errors can occur which can change the documentation meaning.   Nira Conn, MD 11/19/20 [phone]

## 2020-11-20 ENCOUNTER — Other Ambulatory Visit: Payer: Self-pay | Admitting: Family Medicine

## 2020-11-20 DIAGNOSIS — R42 Dizziness and giddiness: Secondary | ICD-10-CM

## 2020-11-21 ENCOUNTER — Ambulatory Visit
Admission: RE | Admit: 2020-11-21 | Discharge: 2020-11-21 | Disposition: A | Discharge status: Home / Self Care | Payer: Medicare Other | Source: Ambulatory Visit | Attending: Family Medicine | Admitting: Family Medicine

## 2020-11-21 ENCOUNTER — Other Ambulatory Visit: Payer: Self-pay

## 2020-11-21 ENCOUNTER — Other Ambulatory Visit: Payer: Self-pay | Admitting: Family Medicine

## 2020-11-21 ENCOUNTER — Other Ambulatory Visit: Payer: Medicare Other

## 2020-11-21 DIAGNOSIS — M542 Cervicalgia: Secondary | ICD-10-CM

## 2020-11-21 DIAGNOSIS — R42 Dizziness and giddiness: Secondary | ICD-10-CM

## 2020-11-21 DIAGNOSIS — R930 Abnormal findings on diagnostic imaging of skull and head, not elsewhere classified: Secondary | ICD-10-CM

## 2020-11-21 IMAGING — MR MR CERVICAL SPINE WO/W CM
5 of 8 series · 25 of 48 positions shown · IV contrast (multihance)
Comparison: None.

CLINICAL DATA: Dizziness.  Neck pain.  Abnormal MRI of the head.

EXAM:
MRI CERVICAL SPINE WITHOUT AND WITH CONTRAST
TECHNIQUE: Multiplanar and multiecho pulse sequences of the cervical spine, to
include the craniocervical junction and cervicothoracic junction,
were obtained without and with intravenous contrast.
CONTRAST:  12mL MULTIHANCE GADOBENATE DIMEGLUMINE 529 MG/ML IV SOLN

[Series 3: T1 · sagittal · 3.0mm · 0.41mm/px · 4 of 13 slices shown (1 of 2)]
[im 1/13]
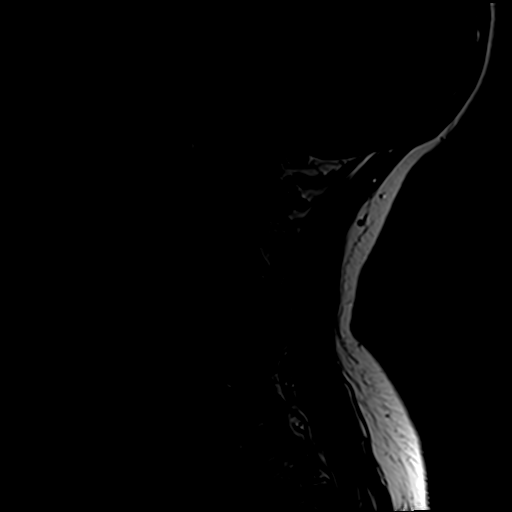
[im 5/13]
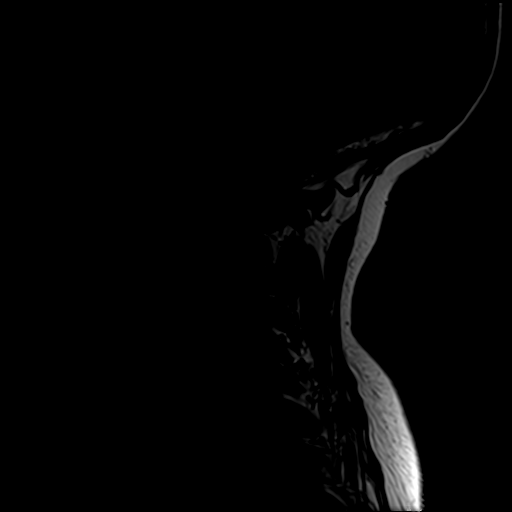
[im 9/13]
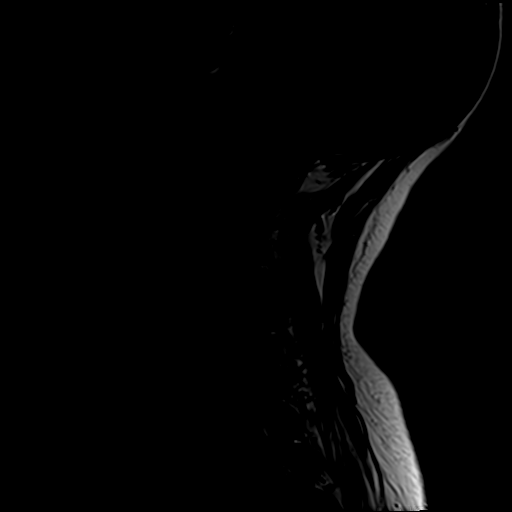
[im 13/13]
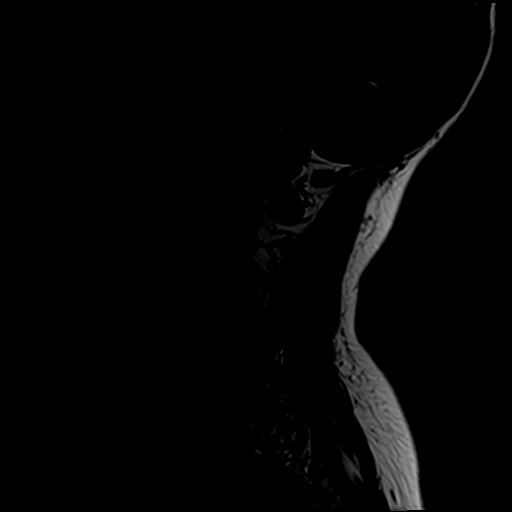

[Series 5: T2 · axial · 3.0mm · 0.70mm/px · z∈[-65,+27]mm · 8 of 26 slices shown (1 of 2)]
[im 1/26]
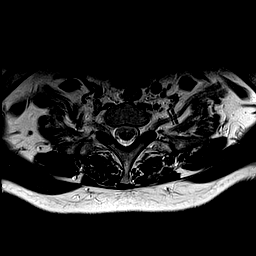
[im 4/26]
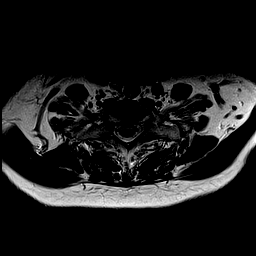
[im 8/26]
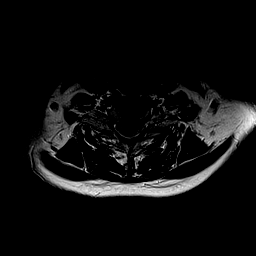
[im 11/26]
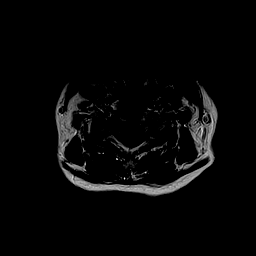
[im 15/26]
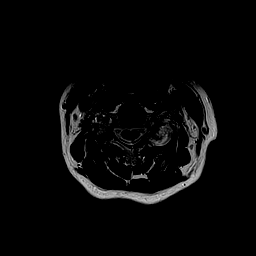
[im 18/26]
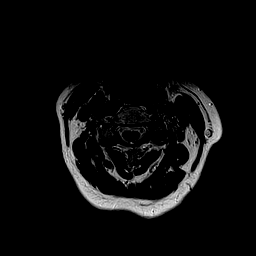
[im 22/26]
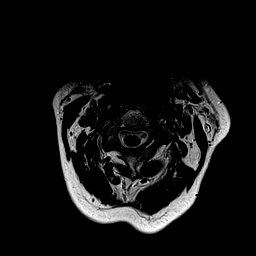
[im 26/26]
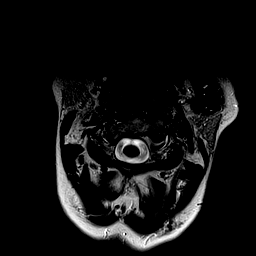

[Series 6: T1 · axial · 3.0mm · 0.35mm/px · z∈[-65,+27]mm · 8 of 26 slices shown (2 of 2)]
[im 1/26]
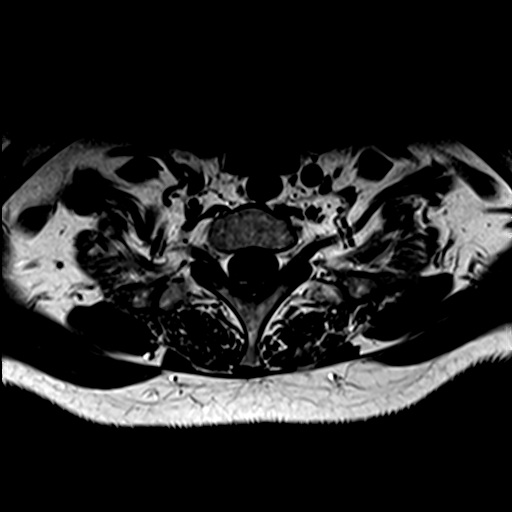
[im 4/26]
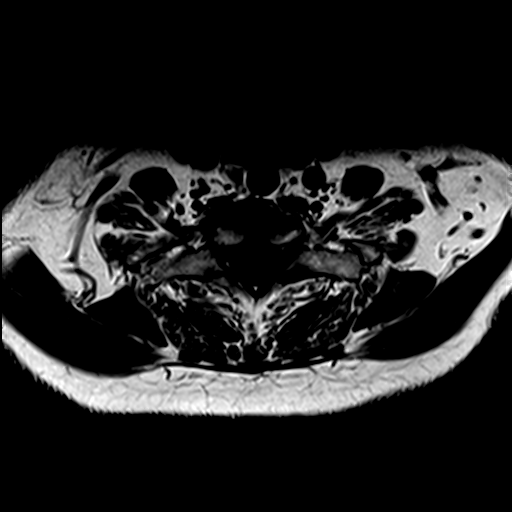
[im 8/26]
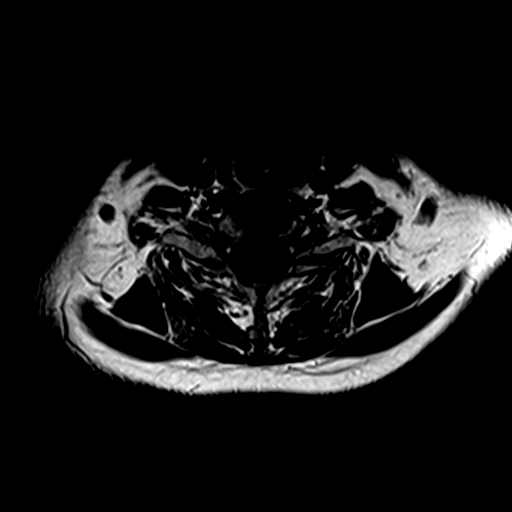
[im 11/26]
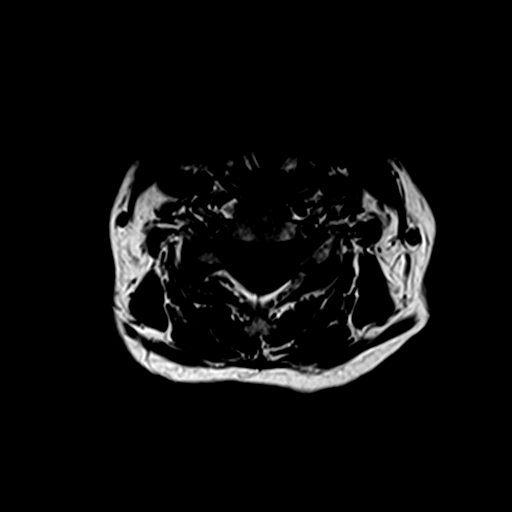
[im 15/26]
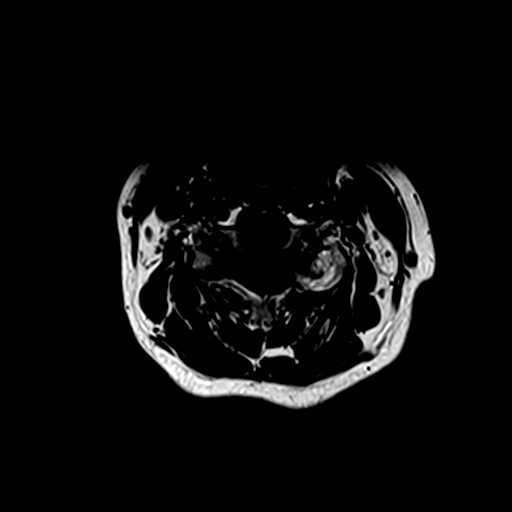
[im 18/26]
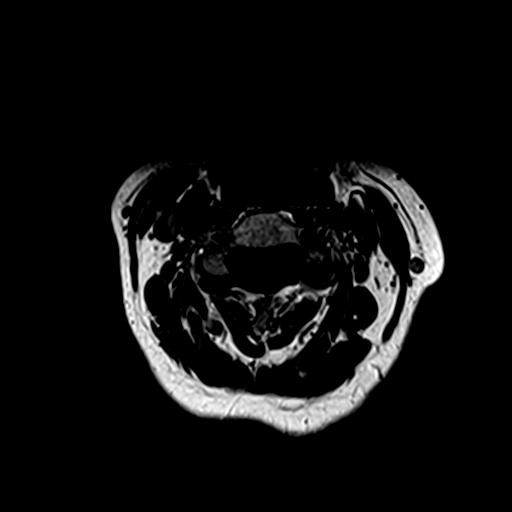
[im 22/26]
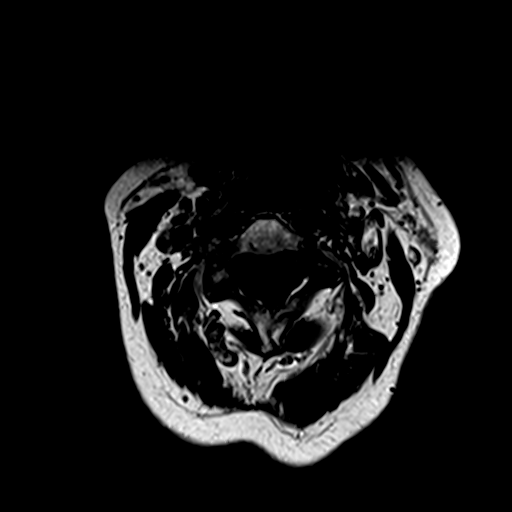
[im 26/26]
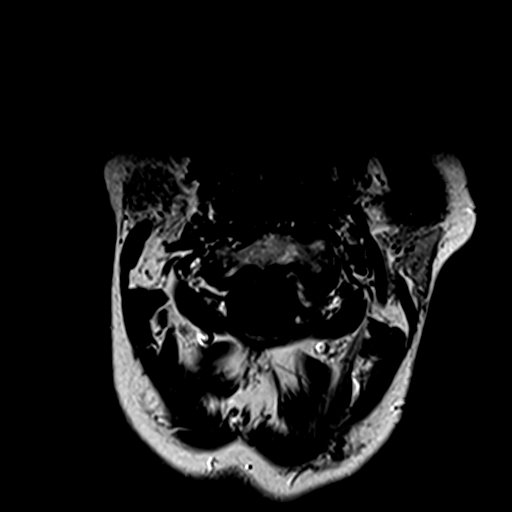

[Series 7: T2 · sagittal · 3.0mm · 0.41mm/px · 4 of 13 slices shown (2 of 2)]
[im 1/13]
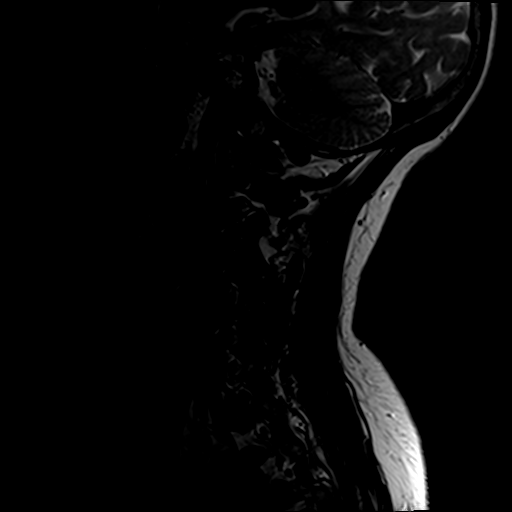
[im 5/13]
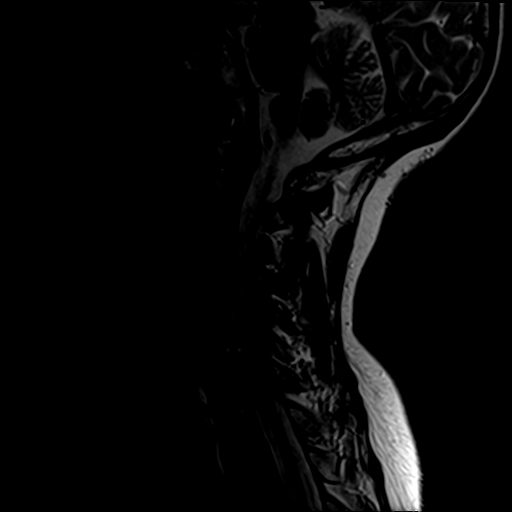
[im 9/13]
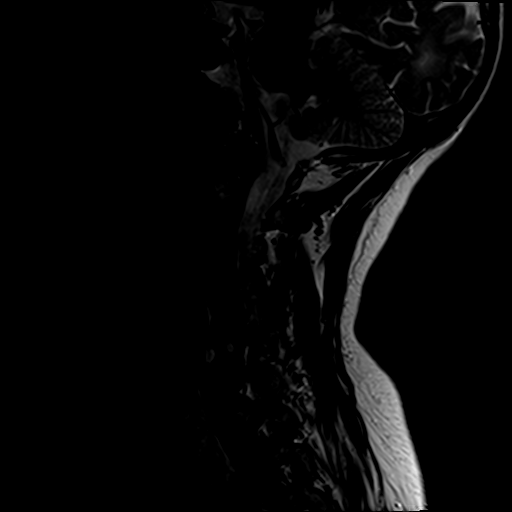
[im 13/13]
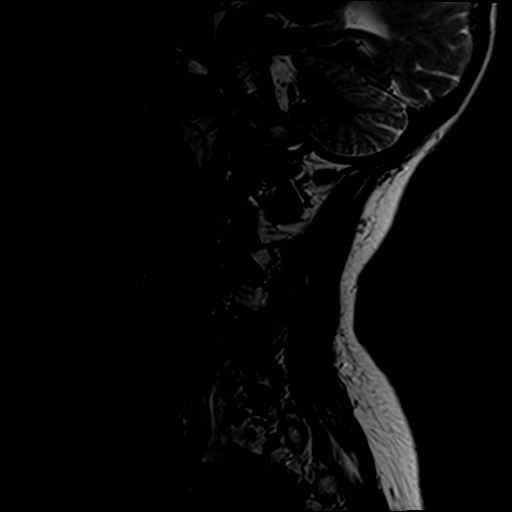

[Series 8: T1 fat-sat post-contrast · sagittal · 3.0mm · 0.82mm/px · 1 of 13 slices shown]
[im 1/13]
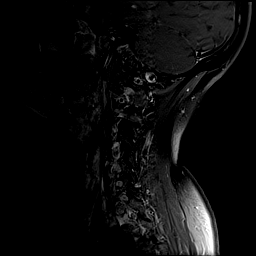

[25 of 48 positions shown; findings below may reference images not displayed]

FINDINGS: Alignment: Mild reversal of the cervical curvature. Small
anterolisthesis of C4 over C5 and small retrolisthesis of C6 over
C7.

Vertebrae: No fracture, evidence of discitis, or bone lesion.

Cord: Normal signal and morphology.

Posterior Fossa, vertebral arteries, paraspinal tissues: Negative.

Disc levels:

C2-3: No spinal canal or neural foraminal stenosis.

C3-4: Small posterior disc protrusion without significant spinal
canal stenosis. Uncovertebral and facet degenerative changes, more
pronounced at the left facet joint without significant neural
foraminal narrowing.

C4-5: Small posterior disc protrusion without significant spinal
canal stenosis. Facet degenerative changes, more pronounced on the
left where there is ankylosis. No significant neural foraminal
narrowing.

C5-6: Posterior disc osteophyte complex resulting in moderate spinal
canal stenosis. Uncovertebral and facet degenerative changes
resulting in severe right and moderate left neural foraminal
narrowing.

C6-7: Posterior disc protrusion resulting in moderate spinal canal
stenosis with mild mass effect on the anterior cord contour without
cord signal abnormality. Uncovertebral and facet degenerative
changes resulting in moderate right and mild left neural foraminal
narrowing.

C7-T1: No spinal canal or neural foraminal stenosis.
IMPRESSION: 1. Multilevel degenerative changes of the cervical spine with
moderate spinal canal stenosis at C5-6 and C6-7.
2. Severe right and moderate left neural foraminal narrowing at
C5-6.
3. Moderate right and mild left neural foraminal narrowing at C6-7.

## 2020-11-21 MED ORDER — GADOBENATE DIMEGLUMINE 529 MG/ML IV SOLN
12.0000 mL | Freq: Once | INTRAVENOUS | Status: AC | PRN
  Administered 2020-11-21: 12 mL via INTRAVENOUS

## 2020-12-12 ENCOUNTER — Other Ambulatory Visit: Payer: Self-pay | Admitting: Neurosurgery

## 2020-12-16 NOTE — Progress Notes (Signed)
NEUROLOGY CONSULTATION NOTE  Monica Ochoa MRN: [id number] DOB: 1946-02-23  Referring provider: Caren Macadam, MD Primary care provider: Caren Macadam, MD  Reason for consult:  dizziness   Subjective:  Monica Ochoa is a 75 year old right-handed female with type 2 diabetes mellitus, HTN, HLD and intermittent palpitations who presents for dizziness.  History supplemented by referring provider's note.  MRI and MRV of brain and MRI cervical spine personally reviewed.  She is accompanied by her husband who supplements history.  She began experiencing dizziness since 09/17/2020.  It is vague and difficult to describe but it is not a spinning sensation.  It is positional, aggravated with head movement to either side or bending over.  It lasts just a few seconds.  She has associated nausea.  No headache, ear pain, numbness, weakness, visual disturbance.  Formal eye exam was normal except for dry eyes.  She saw ENT.  CT sinuses were negative.  On 11/18/2020, she had developed new onset severe non-throbbing frontal headache.  She went to the ED where she had an MRI of the brain without contrast and MRV of head with and without contrast which showed a T2/FLAIR hyperintense focus in the right parietal white matter with associated mild contrast enhancement, but otherwise unremarkable.  Follow up MRI of cervical spine with and without contrast on 11/21/2020 showed multilevel spondylosis with moderate canal stenosis at C5-6 and C6-7 as well as severe right and moderate left neural foraminal stenosis at C5-6 and moderate right and mild left neural foraminal stenosis at C6-7 but no cord abnormalities.  She followed up with neurosurgery regarding her cervical stenosis and has upcoming decompressive surgery.  Dizziness has started to subside.  She denies prior history of migraines or other focal neurologic symptoms.  No recurrence of those headaches.   PAST MEDICAL HISTORY: Past Medical History:  Diagnosis  Date  . Diabetes mellitus without complication ([place])   . Female bladder prolapse   . Hyperlipidemia 02/14/2016  . Hypertension   . Intermittent palpitations    03-23-2018 per pt caused due to anxiety/ upset  . Osteopenia 06/2019   T score -2.1 FRAX 13% / 3.3% overall stable from prior DEXA  . Palpitations   . PVC's (premature ventricular contractions) 03/17/2016  . Wears glasses     PAST SURGICAL HISTORY: Past Surgical History:  Procedure Laterality Date  . ANAL SPHINCTEROPLASTY  02/04/2011   WITH PERINEORRHAPHY  . BREAST EXCISIONAL BIOPSY Left 1989  . LUMBAR SPINE SURGERY  2000  . TOTAL VAGINAL HYSTERECTOMY  1977   W/  ANTERIOR AND POSTERIOR REPAIR    MEDICATIONS: Current Outpatient Medications on File Prior to Visit  Medication Sig Dispense Refill  . ALPRAZolam (XANAX) 0.25 MG tablet Take 0.25 mg by mouth at bedtime as needed for anxiety. (Patient not taking: Reported on 11/06/2020)    . atorvastatin (LIPITOR) 10 MG tablet Take 10 mg by mouth every evening.  (Patient not taking: Reported on 11/06/2020)    . atorvastatin (LIPITOR) 10 MG tablet Take 10 mg by mouth daily.    . calcium carbonate (OS-CAL) 600 MG TABS Take 600 mg by mouth 2 (two) times daily with a meal.    . cholecalciferol (VITAMIN D) 1000 units tablet Take 1 tablet by mouth daily.    Marland Kitchen Co-Enzyme Q-10 100 MG CAPS Take 100 mg by mouth daily.    . Flaxseed, Linseed, (FLAXSEED OIL) 1000 MG CAPS Take 1 capsule by mouth daily.    . fluticasone (FLONASE)  50 MCG/ACT nasal spray Place 1 spray into both nostrils daily as needed for allergies or rhinitis.    Marland Kitchen losartan (COZAAR) 25 MG tablet Take 25 mg by mouth daily.    . Probiotic Product (PROBIOTIC COLON SUPPORT PO) Take 1 capsule by mouth every morning.      No current facility-administered medications on file prior to visit.    ALLERGIES: Allergies  Allergen Reactions  . Codeine Palpitations    "makes heart palpitate"    FAMILY HISTORY: Family History  Problem  Relation Age of Onset  . Hypertension Mother   . Cervical cancer Mother   . Heart disease Mother   . Uterine cancer Mother   . Angina Mother   . Diabetes Father   . Other Father        black lung  . Hypertension Brother   . Diabetes Brother   . Hypertension Sister   . Heart Problems Sister        valve problems  . Crohn's disease Sister   . Breast cancer Neg Hx     SOCIAL HISTORY: Social History   Socioeconomic History  . Marital status: Married    Spouse name: Not on file  . Number of children: Not on file  . Years of education: Not on file  . Highest education level: Not on file  Occupational History  . Not on file  Tobacco Use  . Smoking status: Never Smoker  . Smokeless tobacco: Never Used  Vaping Use  . Vaping Use: Never used  Substance and Sexual Activity  . Alcohol use: No    Alcohol/week: 0.0 standard drinks  . Drug use: No  . Sexual activity: Yes    Birth control/protection: Surgical    Comment: 1st intercourse- 19, partners- 1  Other Topics Concern  . Not on file  Social History Narrative  . Not on file   Social Determinants of Health   Financial Resource Strain: Not on file  Food Insecurity: Not on file  Transportation Needs: Not on file  Physical Activity: Not on file  Stress: Not on file  Social Connections: Not on file  Intimate Partner Violence: Not on file    Objective:  Blood pressure (!) 145/76, pulse 75, height 5\' 3"  (1.6 m), weight 139 lb (63 kg), SpO2 100 %. General: No acute distress.  Patient appears well-groomed.   Head:  Normocephalic/atraumatic Eyes:  fundi examined but not visualized Neck: supple, no paraspinal tenderness, full range of motion Back: No paraspinal tenderness Heart: regular rate and rhythm Lungs: Clear to auscultation bilaterally. Vascular: No carotid bruits. Neurological Exam: Mental status: alert and oriented to person, place, and time, recent and remote memory intact, fund of knowledge intact, attention  and concentration intact, speech fluent and not dysarthric, language intact. Cranial nerves: CN I: not tested CN II: pupils equal, round and reactive to light, visual fields intact CN III, IV, VI:  full range of motion, no nystagmus, no ptosis CN V: facial sensation intact. CN VII: upper and lower face symmetric CN VIII: hearing intact CN IX, X: gag intact, uvula midline CN XI: sternocleidomastoid and trapezius muscles intact CN XII: tongue midline Bulk & Tone: normal, no fasciculations. Motor:  muscle strength 5/5 throughout Sensation:  Pinprick, temperature and vibratory sensation intact. Deep Tendon Reflexes:  2+ throughout,  toes downgoing.   Finger to nose testing:  Without dysmetria.   Heel to shin:  Without dysmetria.   Gait:  Mildly wide based and cautious gait.  Able  to turn and tandem walk.  Romberg negative.  Assessment/Plan:   1.  Dizziness, positional - possibly cervicogenic dizziness, may potentially be inner ear etiology although not classic vertigo.  Does not seem consistent with migraine. 2.  Cervical spondylosis with spinal stenosis 3.  Abnormal white matter lesion on brain MRI - incidental finding.  I don't think this would be cause of her dizziness.  However, suspicious for demyelination.  She has no prior clinical history to suggest MS.  As this appears to be an incidental and asymptomatic finding, we will defer lumbar puncture and monitor for now.  1.  Repeat MRI of brain with and without contrast in 6 months. 2.  Upcoming cervical decompressive surgery.  We will see how she does after surgery and recovery. 3.  Follow up in 6 months after repeat MRI.  Contact me sooner if needed.    Thank you for allowing me to take part in the care of this patient.  Metta Clines, DO  CC: Caren Macadam, MD

## 2020-12-17 ENCOUNTER — Encounter: Payer: Self-pay | Admitting: Neurology

## 2020-12-17 ENCOUNTER — Other Ambulatory Visit: Payer: Self-pay

## 2020-12-17 ENCOUNTER — Ambulatory Visit: Payer: Medicare Other | Admitting: Neurology

## 2020-12-17 VITALS — BP 145/76 | HR 75 | Ht 63.0 in | Wt 139.0 lb

## 2020-12-17 DIAGNOSIS — R9082 White matter disease, unspecified: Secondary | ICD-10-CM

## 2020-12-17 DIAGNOSIS — M4802 Spinal stenosis, cervical region: Secondary | ICD-10-CM

## 2020-12-17 DIAGNOSIS — R42 Dizziness and giddiness: Secondary | ICD-10-CM

## 2020-12-17 NOTE — Patient Instructions (Signed)
Dizziness may be related to the neck - there is something called cervicogenic dizziness.    1.  Follow up with neurosurgery for surgery.  We will see how you do 2.  Plan to repeat MRI of brain with and without contrast in 6 months.  Follow up afterwards.

## 2020-12-18 ENCOUNTER — Ambulatory Visit (INDEPENDENT_AMBULATORY_CARE_PROVIDER_SITE_OTHER): Payer: Medicare Other | Admitting: Otolaryngology

## 2020-12-18 VITALS — Temp 98.1°F

## 2020-12-18 DIAGNOSIS — R42 Dizziness and giddiness: Secondary | ICD-10-CM

## 2020-12-18 NOTE — Progress Notes (Signed)
HPI: Monica Ochoa is a 75 y.o. female who returns today for evaluation of dizziness and being off balance.  She also complains of her head feeling heavy especially when she bends over.  She thought she might be having sinus problems.  However she had a recent CT scan in the MRI scan of her head that showed clear paranasal sinuses. She apparently has also been diagnosed recently with cervical cord compression and is scheduled to have surgery on her neck in 2 weeks. She felt like a lot of her symptoms started following the Covid shot booster. She has not described any spinning sensation or vertigo..  She has not noted any change in her hearing. I reviewed the CT scan of her head and MRI scan with the patient in the office today.  Past Medical History:  Diagnosis Date  . Diabetes mellitus without complication ([place])   . Female bladder prolapse   . Hyperlipidemia 02/14/2016  . Hypertension   . Intermittent palpitations    03-23-2018 per pt caused due to anxiety/ upset  . Osteopenia 06/2019   T score -2.1 FRAX 13% / 3.3% overall stable from prior DEXA  . Palpitations   . PVC's (premature ventricular contractions) 03/17/2016  . Wears glasses    Past Surgical History:  Procedure Laterality Date  . ANAL SPHINCTEROPLASTY  02/04/2011   WITH PERINEORRHAPHY  . BREAST EXCISIONAL BIOPSY Left 1989  . LUMBAR SPINE SURGERY  2000  . TOTAL VAGINAL HYSTERECTOMY  1977   W/  ANTERIOR AND POSTERIOR REPAIR   Social History   Socioeconomic History  . Marital status: Married    Spouse name: Not on file  . Number of children: Not on file  . Years of education: Not on file  . Highest education level: Not on file  Occupational History  . Not on file  Tobacco Use  . Smoking status: Never Smoker  . Smokeless tobacco: Never Used  Vaping Use  . Vaping Use: Never used  Substance and Sexual Activity  . Alcohol use: No    Alcohol/week: 0.0 standard drinks  . Drug use: No  . Sexual activity: Yes     Birth control/protection: Surgical    Comment: 1st intercourse- 19, partners- 1  Other Topics Concern  . Not on file  Social History Narrative   Right handed   Social Determinants of Health   Financial Resource Strain: Not on file  Food Insecurity: Not on file  Transportation Needs: Not on file  Physical Activity: Not on file  Stress: Not on file  Social Connections: Not on file   Family History  Problem Relation Age of Onset  . Hypertension Mother   . Cervical cancer Mother   . Heart disease Mother   . Uterine cancer Mother   . Angina Mother   . Diabetes Father   . Other Father        black lung  . Hypertension Brother   . Diabetes Brother   . Hypertension Sister   . Heart Problems Sister        valve problems  . Crohn's disease Sister   . Breast cancer Neg Hx    Allergies  Allergen Reactions  . Codeine Palpitations    "makes heart palpitate"   Prior to Admission medications   Medication Sig Start Date End Date Taking? Authorizing Provider  atorvastatin (LIPITOR) 10 MG tablet Take 10 mg by mouth every evening.    [provider]  calcium carbonate (OS-CAL) 600 MG TABS  Take 600 mg by mouth 2 (two) times daily with a meal.    [provider]  carboxymethylcellulose (REFRESH PLUS) 0.5 % SOLN Place 1 drop into both eyes daily as needed (dry eyes).    [provider]  cholecalciferol (VITAMIN D) 1000 units tablet Take 1,000 Units by mouth daily.    [provider]  Co-Enzyme Q-10 100 MG CAPS Take 100 mg by mouth daily.    [provider]  Flaxseed, Linseed, (FLAXSEED OIL) 1000 MG CAPS Take 1,000 mg by mouth daily.    [provider]  fluticasone (FLONASE) 50 MCG/ACT nasal spray Place 1 spray into both nostrils daily as needed for allergies or rhinitis.    [provider]  losartan (COZAAR) 25 MG tablet Take 25 mg by mouth daily.    [provider]  Probiotic Product (PROBIOTIC COLON SUPPORT PO) Take 1  capsule by mouth every morning.    [provider]     Positive ROS: Otherwise negative  All other systems have been reviewed and were otherwise negative with the exception of those mentioned in the HPI and as above.  Physical Exam: Constitutional: Alert, well-appearing, no acute distress Ears: External ears without lesions or tenderness. Ear canals are clear bilaterally with intact, clear TMs bilaterally.  Dix-Hallpike testing did not demonstrate any BPPV. Nasal: External nose without lesions. Septum with mild deformity. Clear nasal passages bilaterally. Oral: Lips and gums without lesions. Tongue and palate mucosa without lesions. Posterior oropharynx clear. Neck: No palpable adenopathy or masses Respiratory: Breathing comfortably  Skin: No facial/neck lesions or rash noted.  Procedures  Assessment: Dizziness  Plan: Do not feel like this is related to her sinuses or ears.  Perhaps this will improve following the neck surgery. Otherwise would recommend vestibular rehab.   Radene Journey, MD

## 2020-12-26 NOTE — Progress Notes (Signed)
Surgical Instructions  Your procedure is scheduled on Monday, February 14th.  Report to [facility] Main Entrance "A" at 6:00 A.M., then check in with the Admitting office.  Call this number if you have problems the morning of surgery:  [phone]   If you have any questions prior to your surgery date call [phone]: Open Monday-Friday 8am-4pm   Remember:  Do NOT eat or drink after midnight the night before your surgery    Take these medicines the morning of surgery IF NEEDED:  Eye drops fluticasone (FLONASE)/nasal spray   As of today, STOP taking any Aspirin (unless otherwise instructed by your surgeon) Aleve, Naproxen, Ibuprofen, Motrin, Advil, Goody's, BC's, all herbal medications, fish oil, and all vitamins.                     Do not wear jewelry, make up, or nail polish            Do not wear lotions, powders, perfumes, or deodorant.            Do not shave 48 hours prior to surgery.             Do not bring valuables to the hospital.            [facility] is not responsible for any belongings or valuables.  Do NOT Smoke (Tobacco/Vaping) or drink Alcohol 24 hours prior to your procedure If you use a CPAP at night, you may bring all equipment for your overnight stay.   Contacts, glasses, dentures or bridgework may not be worn into surgery, please bring cases for these belongings   For patients admitted to the hospital, discharge time will be determined by your treatment team.   Patients discharged the day of surgery will not be allowed to drive home, and someone needs to stay with them for 24 hours.  Special instructions:   [place]- Preparing For Surgery  Before surgery, you can play an important role. Because skin is not sterile, your skin needs to be as free of germs as possible. You can reduce the number of germs on your skin by washing with CHG (chlorahexidine gluconate) Soap before surgery.  CHG is an antiseptic cleaner which kills germs and bonds with the skin  to continue killing germs even after washing.    Oral Hygiene is also important to reduce your risk of infection.  Remember - BRUSH YOUR TEETH THE MORNING OF SURGERY WITH YOUR REGULAR TOOTHPASTE  Please do not use if you have an allergy to CHG or antibacterial soaps. If your skin becomes reddened/irritated stop using the CHG.  Do not shave (including legs and underarms) for at least 48 hours prior to first CHG shower. It is OK to shave your face.  Please follow these instructions carefully.   1. Shower the NIGHT BEFORE SURGERY and the MORNING OF SURGERY  2. If you chose to wash your hair, wash your hair first as usual with your normal shampoo.  3. After you shampoo, rinse your hair and body thoroughly to remove the shampoo.  4. Wash Face and genitals (private parts) with your normal soap.   5. Use CHG Soap as you would any other liquid soap. You can apply CHG directly to the skin and wash gently with a scrungie or a clean washcloth.   6. Apply the CHG Soap to your body ONLY FROM THE NECK DOWN.  Do not use on open wounds or open sores. Avoid contact with your  eyes, ears, mouth and genitals (private parts). Wash Face and genitals (private parts)  with your normal soap.   7. Wash thoroughly, paying special attention to the area where your surgery will be performed.  8. Thoroughly rinse your body with warm water from the neck down.  9. DO NOT shower/wash with your normal soap after using and rinsing off the CHG Soap.  10. Pat yourself dry with a CLEAN TOWEL.  11. Wear CLEAN PAJAMAS to bed the night before surgery  12. Place CLEAN SHEETS on your bed the night before your surgery  13. DO NOT SLEEP WITH PETS.  Day of Surgery: Shower with CHG soap Wear Clean/Comfortable clothing the morning of surgery Do not apply any deodorants/lotions.   Remember to brush your teeth WITH YOUR REGULAR TOOTHPASTE.   Please read over the following fact sheets that you were given.

## 2020-12-27 ENCOUNTER — Other Ambulatory Visit: Payer: Self-pay

## 2020-12-27 ENCOUNTER — Other Ambulatory Visit (HOSPITAL_COMMUNITY)
Admission: RE | Admit: 2020-12-27 | Discharge: 2020-12-27 | Disposition: A | Discharge status: Home / Self Care | Payer: Medicare Other | Source: Ambulatory Visit | Attending: Neurosurgery | Admitting: Neurosurgery

## 2020-12-27 ENCOUNTER — Encounter (HOSPITAL_COMMUNITY)
Admission: RE | Admit: 2020-12-27 | Discharge: 2020-12-27 | Disposition: A | Discharge status: Home / Self Care | Payer: Medicare Other | Source: Ambulatory Visit | Attending: Neurosurgery | Admitting: Neurosurgery

## 2020-12-27 ENCOUNTER — Encounter (HOSPITAL_COMMUNITY): Payer: Self-pay

## 2020-12-27 DIAGNOSIS — Z20822 Contact with and (suspected) exposure to covid-19: Secondary | ICD-10-CM | POA: Insufficient documentation

## 2020-12-27 DIAGNOSIS — Z01812 Encounter for preprocedural laboratory examination: Secondary | ICD-10-CM | POA: Insufficient documentation

## 2020-12-27 LAB — CBC WITH DIFFERENTIAL/PLATELET
Abs Immature Granulocytes: 0.01 10*3/uL (ref 0.00–0.07)
Basophils Absolute: 0.1 10*3/uL (ref 0.0–0.1)
Basophils Relative: 1 %
Eosinophils Absolute: 0.2 10*3/uL (ref 0.0–0.5)
Eosinophils Relative: 2 %
HCT: 43.7 % (ref 36.0–46.0)
Hemoglobin: 14.2 g/dL (ref 12.0–15.0)
Immature Granulocytes: 0 %
Lymphocytes Relative: 31 %
Lymphs Abs: 2 10*3/uL (ref 0.7–4.0)
MCH: 28.9 pg (ref 26.0–34.0)
MCHC: 32.5 g/dL (ref 30.0–36.0)
MCV: 88.8 fL (ref 80.0–100.0)
Monocytes Absolute: 0.6 10*3/uL (ref 0.1–1.0)
Monocytes Relative: 9 %
Neutro Abs: 3.6 10*3/uL (ref 1.7–7.7)
Neutrophils Relative %: 57 %
Platelets: 287 10*3/uL (ref 150–400)
RBC: 4.92 MIL/uL (ref 3.87–5.11)
RDW: 12.9 % (ref 11.5–15.5)
WBC: 6.4 10*3/uL (ref 4.0–10.5)
nRBC: 0 % (ref 0.0–0.2)

## 2020-12-27 LAB — BASIC METABOLIC PANEL
Anion gap: 8 (ref 5–15)
BUN: 8 mg/dL (ref 8–23)
CO2: 29 mmol/L (ref 22–32)
Calcium: 9.6 mg/dL (ref 8.9–10.3)
Chloride: 99 mmol/L (ref 98–111)
Creatinine, Ser: 0.84 mg/dL (ref 0.44–1.00)
GFR, Estimated: >60 mL/min (ref >60)
Glucose, Bld: 107 mg/dL — ABNORMAL HIGH (ref 70–99)
Potassium: 4.4 mmol/L (ref 3.5–5.1)
Sodium: 136 mmol/L (ref 135–145)

## 2020-12-27 LAB — TYPE AND SCREEN
ABO/RH(D): B POS
Antibody Screen: NEGATIVE

## 2020-12-27 LAB — SURGICAL PCR SCREEN
MRSA, PCR: NEGATIVE
Staphylococcus aureus: NEGATIVE

## 2020-12-27 LAB — HEMOGLOBIN A1C
Hgb A1c MFr Bld: 6.1 % — ABNORMAL HIGH (ref 4.8–5.6)
Mean Plasma Glucose: 128.37 mg/dL

## 2020-12-27 LAB — GLUCOSE, CAPILLARY: Glucose-Capillary: 144 mg/dL — ABNORMAL HIGH (ref 70–99)

## 2020-12-27 LAB — SARS CORONAVIRUS 2 (TAT 6-24 HRS): SARS Coronavirus 2: NEGATIVE

## 2020-12-27 NOTE — Progress Notes (Signed)
PCP - Dr. Caren Macadam Cardiologist - Denies Neurologist: Dr. Metta Clines  PPM/ICD - Denies  Chest x-ray - N/A EKG - 11/19/20 Stress Test - Denies ECHO - Denies Cardiac Cath - Denies  Sleep Study - Denies  Patient is diabetic, but states that it is controlled by her diet. She occasionally checks her blood sugars when necessary (never a fasting blood sugar). PCP follows.   Blood Thinner Instructions: N/A Aspirin Instructions: N/A  ERAS Protcol - No  COVID TEST- 12/27/20   Anesthesia review: No  Patient denies shortness of breath, fever, cough and chest pain at PAT appointment   All instructions explained to the patient, with a verbal understanding of the material. Patient agrees to go over the instructions while at home for a better understanding. Patient also instructed to self quarantine after being tested for COVID-19. The opportunity to ask questions was provided.

## 2020-12-31 ENCOUNTER — Ambulatory Visit (HOSPITAL_COMMUNITY): Payer: Medicare Other | Admitting: Anesthesiology

## 2020-12-31 ENCOUNTER — Ambulatory Visit (HOSPITAL_COMMUNITY): Payer: Medicare Other

## 2020-12-31 ENCOUNTER — Encounter (HOSPITAL_COMMUNITY)
Admission: RE | Disposition: A | Discharge status: Home / Self Care | Payer: Self-pay | Source: Home / Self Care | Attending: Neurosurgery

## 2020-12-31 ENCOUNTER — Encounter (HOSPITAL_COMMUNITY): Payer: Self-pay | Admitting: Neurosurgery

## 2020-12-31 ENCOUNTER — Observation Stay (HOSPITAL_COMMUNITY)
Admission: RE | Admit: 2020-12-31 | Discharge: 2021-01-01 | Disposition: A | Discharge status: Home / Self Care | Payer: Medicare Other | Attending: Neurosurgery | Admitting: Neurosurgery

## 2020-12-31 DIAGNOSIS — M4712 Other spondylosis with myelopathy, cervical region: Secondary | ICD-10-CM | POA: Diagnosis not present

## 2020-12-31 DIAGNOSIS — I10 Essential (primary) hypertension: Secondary | ICD-10-CM | POA: Insufficient documentation

## 2020-12-31 DIAGNOSIS — E119 Type 2 diabetes mellitus without complications: Secondary | ICD-10-CM | POA: Diagnosis not present

## 2020-12-31 DIAGNOSIS — G959 Disease of spinal cord, unspecified: Secondary | ICD-10-CM | POA: Diagnosis present

## 2020-12-31 DIAGNOSIS — Z79899 Other long term (current) drug therapy: Secondary | ICD-10-CM | POA: Diagnosis not present

## 2020-12-31 DIAGNOSIS — R531 Weakness: Secondary | ICD-10-CM | POA: Diagnosis present

## 2020-12-31 DIAGNOSIS — M50222 Other cervical disc displacement at C5-C6 level: Secondary | ICD-10-CM | POA: Diagnosis not present

## 2020-12-31 DIAGNOSIS — Z09 Encounter for follow-up examination after completed treatment for conditions other than malignant neoplasm: Secondary | ICD-10-CM

## 2020-12-31 HISTORY — PX: ANTERIOR CERVICAL DECOMP/DISCECTOMY FUSION: SHX1161

## 2020-12-31 LAB — GLUCOSE, CAPILLARY
Glucose-Capillary: 123 mg/dL — ABNORMAL HIGH (ref 70–99)
Glucose-Capillary: 165 mg/dL — ABNORMAL HIGH (ref 70–99)
Glucose-Capillary: 171 mg/dL — ABNORMAL HIGH (ref 70–99)
Glucose-Capillary: 150 mg/dL — ABNORMAL HIGH (ref 70–99)

## 2020-12-31 IMAGING — RF DG CERVICAL SPINE 1V
1 series · 1 of 1 positions shown · non-contrast
Comparison: Cervical MRI [DATE]

FLUOROSCOPY TIME:  0 minutes 4 seconds; 0.34 mGy; 1 acquired image

CLINICAL DATA: Anterior fusion

EXAM:
DG CERVICAL SPINE - 1 VIEW; DG C-ARM 1-60 MIN

[Series 1: run · 1 of 1 slices shown]
[im 1/1]
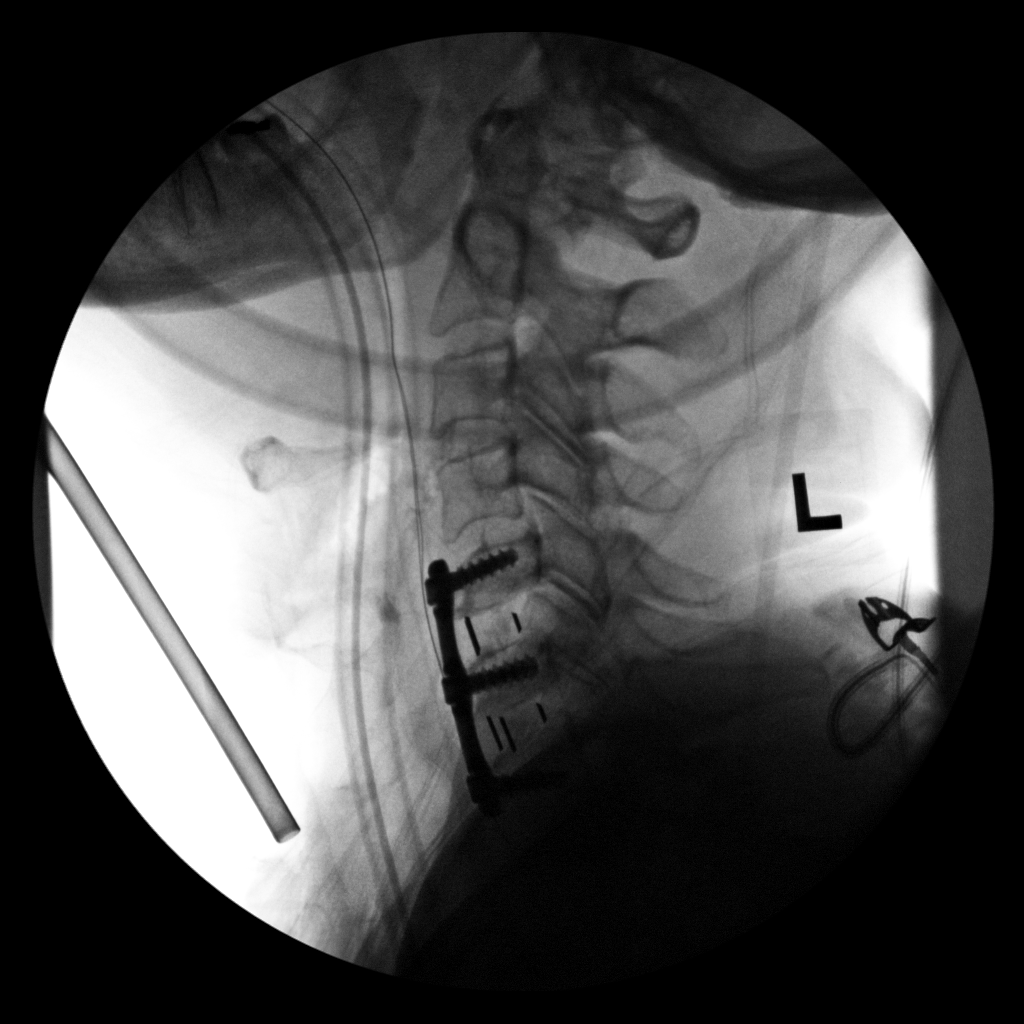

[1 of 1 positions shown; findings below may reference images not displayed]

FINDINGS: Lateral lumbar image submitted. There is anterior screw and plate
fixation at C5, C6, and C7 with disc spacers at C5-6 and C6-7. No
fracture or spondylolisthesis. Disc spaces appear intact on lateral
view.
IMPRESSION: Anterior screw and plate fixation from C5-C7 with disc spacers at
C5-6 and C6-7. Support hardware appears intact on lateral view. No
appreciable disc space narrowing. No fracture or spondylolisthesis.

## 2020-12-31 IMAGING — RF DG C-ARM 1-60 MIN
1 series · 1 of 1 positions shown · non-contrast
Comparison: Cervical MRI [DATE]

FLUOROSCOPY TIME:  0 minutes 4 seconds; 0.34 mGy; 1 acquired image

CLINICAL DATA: Anterior fusion

EXAM:
DG CERVICAL SPINE - 1 VIEW; DG C-ARM 1-60 MIN

[Series 1: run · 1 of 1 slices shown]
[im 1/1]
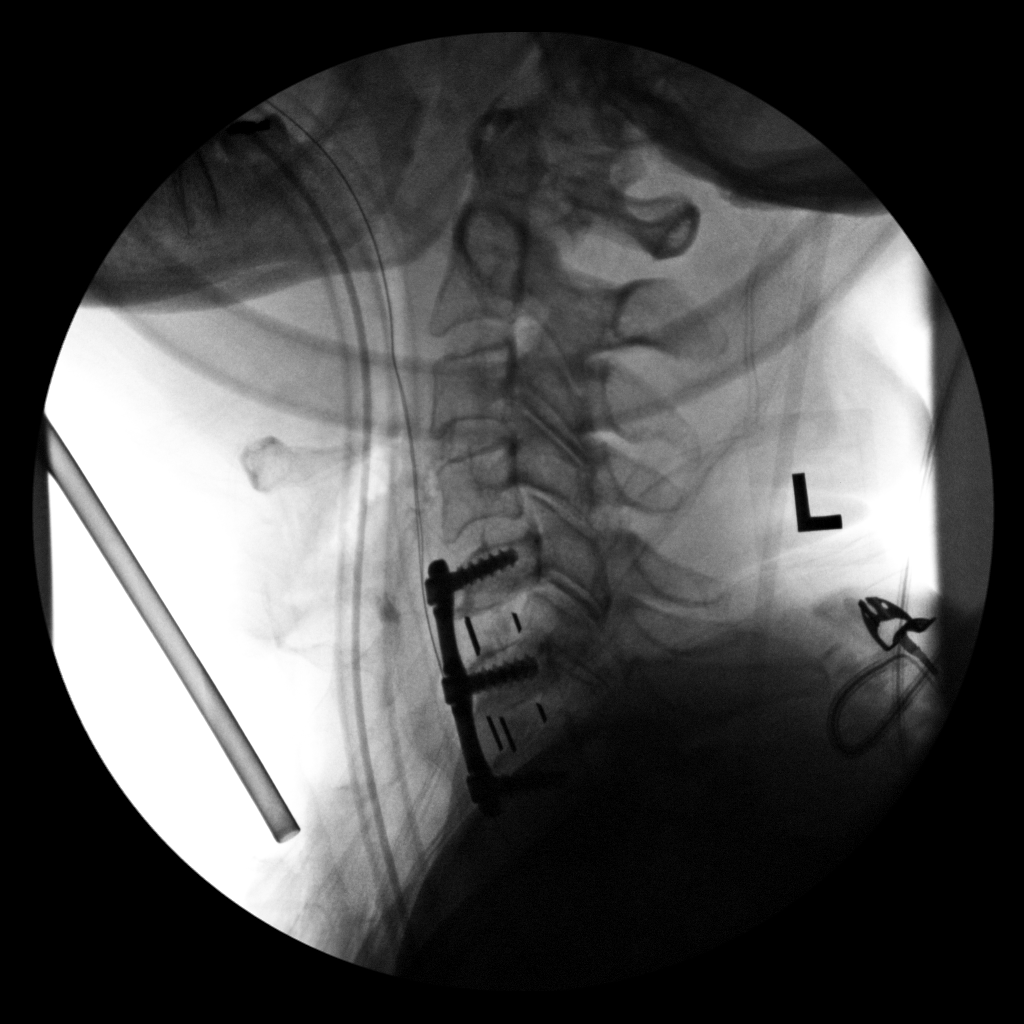

[1 of 1 positions shown; findings below may reference images not displayed]

FINDINGS: Lateral lumbar image submitted. There is anterior screw and plate
fixation at C5, C6, and C7 with disc spacers at C5-6 and C6-7. No
fracture or spondylolisthesis. Disc spaces appear intact on lateral
view.
IMPRESSION: Anterior screw and plate fixation from C5-C7 with disc spacers at
C5-6 and C6-7. Support hardware appears intact on lateral view. No
appreciable disc space narrowing. No fracture or spondylolisthesis.

## 2020-12-31 SURGERY — ANTERIOR CERVICAL DECOMPRESSION/DISCECTOMY FUSION 2 LEVELS
Anesthesia: General

## 2020-12-31 MED ORDER — SUGAMMADEX SODIUM 200 MG/2ML IV SOLN
INTRAVENOUS | Status: DC | PRN
  Administered 2020-12-31: 200 mg via INTRAVENOUS

## 2020-12-31 MED ORDER — HYDROMORPHONE HCL 1 MG/ML IJ SOLN
1.0000 mg | INTRAMUSCULAR | Status: DC | PRN
  Administered 2020-12-31: 1 mg via INTRAVENOUS
  Filled 2020-12-31: qty 1

## 2020-12-31 MED ORDER — ROCURONIUM BROMIDE 10 MG/ML (PF) SYRINGE
PREFILLED_SYRINGE | INTRAVENOUS | Status: AC
  Filled 2020-12-31: qty 10

## 2020-12-31 MED ORDER — ORAL CARE MOUTH RINSE: 15.0000 mL | Freq: Once | OROMUCOSAL | Status: AC

## 2020-12-31 MED ORDER — CHLORHEXIDINE GLUCONATE CLOTH 2 % EX PADS: 6.0000 | MEDICATED_PAD | Freq: Once | CUTANEOUS | Status: DC

## 2020-12-31 MED ORDER — PROPOFOL 10 MG/ML IV BOLUS
INTRAVENOUS | Status: AC
  Filled 2020-12-31: qty 20

## 2020-12-31 MED ORDER — PROPOFOL 10 MG/ML IV BOLUS
INTRAVENOUS | Status: DC | PRN
  Administered 2020-12-31: 130 mg via INTRAVENOUS

## 2020-12-31 MED ORDER — ONDANSETRON HCL 4 MG/2ML IJ SOLN
INTRAMUSCULAR | Status: DC | PRN
  Administered 2020-12-31: 4 mg via INTRAVENOUS

## 2020-12-31 MED ORDER — CO-ENZYME Q-10 100 MG PO CAPS: 100.0000 mg | ORAL_CAPSULE | Freq: Every day | ORAL | Status: DC

## 2020-12-31 MED ORDER — FENTANYL CITRATE (PF) 250 MCG/5ML IJ SOLN
INTRAMUSCULAR | Status: DC | PRN
  Administered 2020-12-31: 50 ug via INTRAVENOUS
  Administered 2020-12-31: 100 ug via INTRAVENOUS
  Administered 2020-12-31 (×2): 50 ug via INTRAVENOUS

## 2020-12-31 MED ORDER — PHENYLEPHRINE HCL-NACL 10-0.9 MG/250ML-% IV SOLN
INTRAVENOUS | Status: DC | PRN
  Administered 2020-12-31: 40 ug/min via INTRAVENOUS

## 2020-12-31 MED ORDER — ONDANSETRON HCL 4 MG/2ML IJ SOLN
4.0000 mg | Freq: Four times a day (QID) | INTRAMUSCULAR | Status: DC | PRN
  Administered 2020-12-31 – 2021-01-01 (×3): 4 mg via INTRAVENOUS
  Filled 2020-12-31 (×3): qty 2

## 2020-12-31 MED ORDER — CYCLOBENZAPRINE HCL 10 MG PO TABS
10.0000 mg | ORAL_TABLET | Freq: Three times a day (TID) | ORAL | Status: DC | PRN
  Administered 2020-12-31 (×2): 10 mg via ORAL
  Filled 2020-12-31: qty 1

## 2020-12-31 MED ORDER — LACTATED RINGERS IV SOLN: INTRAVENOUS | Status: DC

## 2020-12-31 MED ORDER — VITAMIN D 25 MCG (1000 UNIT) PO TABS
1000.0000 [IU] | ORAL_TABLET | Freq: Every day | ORAL | Status: DC
  Administered 2021-01-01: 1000 [IU] via ORAL
  Filled 2020-12-31: qty 1

## 2020-12-31 MED ORDER — DEXAMETHASONE SODIUM PHOSPHATE 10 MG/ML IJ SOLN
10.0000 mg | Freq: Once | INTRAMUSCULAR | Status: AC
  Administered 2020-12-31: 10 mg via INTRAVENOUS
  Filled 2020-12-31: qty 1

## 2020-12-31 MED ORDER — HEMOSTATIC AGENTS (NO CHARGE) OPTIME
TOPICAL | Status: DC | PRN
  Administered 2020-12-31: 1 via TOPICAL

## 2020-12-31 MED ORDER — FENTANYL CITRATE (PF) 100 MCG/2ML IJ SOLN
INTRAMUSCULAR | Status: AC
  Administered 2020-12-31: 50 ug via INTRAVENOUS
  Filled 2020-12-31: qty 2

## 2020-12-31 MED ORDER — OXYCODONE HCL 5 MG PO TABS: 5.0000 mg | ORAL_TABLET | Freq: Once | ORAL | Status: AC | PRN

## 2020-12-31 MED ORDER — FLAXSEED OIL 1000 MG PO CAPS
1000.0000 mg | ORAL_CAPSULE | Freq: Every day | ORAL | Status: DC
Start: 2020-12-31 — End: 2020-12-31

## 2020-12-31 MED ORDER — CHLORHEXIDINE GLUCONATE 0.12 % MT SOLN
15.0000 mL | Freq: Once | OROMUCOSAL | Status: AC
  Administered 2020-12-31: 15 mL via OROMUCOSAL
  Filled 2020-12-31: qty 15

## 2020-12-31 MED ORDER — ONDANSETRON HCL 4 MG PO TABS: 4.0000 mg | ORAL_TABLET | Freq: Four times a day (QID) | ORAL | Status: DC | PRN

## 2020-12-31 MED ORDER — LIDOCAINE 2% (20 MG/ML) 5 ML SYRINGE
INTRAMUSCULAR | Status: DC | PRN
  Administered 2020-12-31: 60 mg via INTRAVENOUS

## 2020-12-31 MED ORDER — ONDANSETRON HCL 4 MG/2ML IJ SOLN
INTRAMUSCULAR | Status: AC
  Filled 2020-12-31: qty 2

## 2020-12-31 MED ORDER — LIDOCAINE 2% (20 MG/ML) 5 ML SYRINGE
INTRAMUSCULAR | Status: AC
  Filled 2020-12-31: qty 5

## 2020-12-31 MED ORDER — HYDROCODONE-ACETAMINOPHEN 10-325 MG PO TABS: 1.0000 | ORAL_TABLET | ORAL | Status: DC | PRN

## 2020-12-31 MED ORDER — SODIUM CHLORIDE 0.9% FLUSH: 3.0000 mL | INTRAVENOUS | Status: DC | PRN

## 2020-12-31 MED ORDER — CALCIUM CARBONATE 1250 (500 CA) MG PO TABS
1.0000 | ORAL_TABLET | Freq: Two times a day (BID) | ORAL | Status: DC
  Administered 2020-12-31 – 2021-01-01 (×2): 500 mg via ORAL
  Filled 2020-12-31 (×2): qty 1

## 2020-12-31 MED ORDER — SODIUM CHLORIDE 0.9% FLUSH
3.0000 mL | Freq: Two times a day (BID) | INTRAVENOUS | Status: DC
  Administered 2020-12-31 (×2): 3 mL via INTRAVENOUS

## 2020-12-31 MED ORDER — THROMBIN 5000 UNITS EX SOLR
CUTANEOUS | Status: AC
  Filled 2020-12-31: qty 10000

## 2020-12-31 MED ORDER — THROMBIN 5000 UNITS EX SOLR
CUTANEOUS | Status: DC | PRN
  Administered 2020-12-31 (×2): 5000 [IU] via TOPICAL

## 2020-12-31 MED ORDER — THROMBIN 5000 UNITS EX SOLR
OROMUCOSAL | Status: DC | PRN
  Administered 2020-12-31: 5 mL via TOPICAL

## 2020-12-31 MED ORDER — ACETAMINOPHEN 650 MG RE SUPP: 650.0000 mg | RECTAL | Status: DC | PRN

## 2020-12-31 MED ORDER — CYCLOBENZAPRINE HCL 10 MG PO TABS
ORAL_TABLET | ORAL | Status: AC
  Filled 2020-12-31: qty 1

## 2020-12-31 MED ORDER — FENTANYL CITRATE (PF) 100 MCG/2ML IJ SOLN
25.0000 ug | INTRAMUSCULAR | Status: DC | PRN
  Administered 2020-12-31: 50 ug via INTRAVENOUS

## 2020-12-31 MED ORDER — OXYCODONE HCL 5 MG PO TABS
ORAL_TABLET | ORAL | Status: AC
  Administered 2020-12-31: 5 mg via ORAL
  Filled 2020-12-31: qty 1

## 2020-12-31 MED ORDER — ACETAMINOPHEN 325 MG PO TABS: 650.0000 mg | ORAL_TABLET | ORAL | Status: DC | PRN

## 2020-12-31 MED ORDER — CARBOXYMETHYLCELLULOSE SODIUM 0.5 % OP SOLN: 1.0000 [drp] | Freq: Every day | OPHTHALMIC | Status: DC | PRN

## 2020-12-31 MED ORDER — 0.9 % SODIUM CHLORIDE (POUR BTL) OPTIME
TOPICAL | Status: DC | PRN
  Administered 2020-12-31: 1000 mL

## 2020-12-31 MED ORDER — CEFAZOLIN SODIUM-DEXTROSE 1-4 GM/50ML-% IV SOLN
1.0000 g | Freq: Three times a day (TID) | INTRAVENOUS | Status: AC
  Administered 2020-12-31 – 2021-01-01 (×2): 1 g via INTRAVENOUS
  Filled 2020-12-31 (×2): qty 50

## 2020-12-31 MED ORDER — SODIUM CHLORIDE 0.9 % IV SOLN
250.0000 mL | INTRAVENOUS | Status: DC
  Administered 2020-12-31: 250 mL via INTRAVENOUS

## 2020-12-31 MED ORDER — ATORVASTATIN CALCIUM 10 MG PO TABS
10.0000 mg | ORAL_TABLET | Freq: Every evening | ORAL | Status: DC
Start: 2020-12-31 — End: 2021-01-01
  Administered 2020-12-31: 10 mg via ORAL
  Filled 2020-12-31: qty 1

## 2020-12-31 MED ORDER — THROMBIN 5000 UNITS EX SOLR
CUTANEOUS | Status: AC
  Filled 2020-12-31: qty 5000

## 2020-12-31 MED ORDER — POLYVINYL ALCOHOL 1.4 % OP SOLN: 1.0000 [drp] | Freq: Every day | OPHTHALMIC | Status: DC | PRN

## 2020-12-31 MED ORDER — MENTHOL 3 MG MT LOZG: 1.0000 | LOZENGE | OROMUCOSAL | Status: DC | PRN

## 2020-12-31 MED ORDER — PHENOL 1.4 % MT LIQD: 1.0000 | OROMUCOSAL | Status: DC | PRN

## 2020-12-31 MED ORDER — PROBIOTIC COLON SUPPORT PO CAPS: ORAL_CAPSULE | Freq: Every morning | ORAL | Status: DC

## 2020-12-31 MED ORDER — OXYCODONE HCL 5 MG/5ML PO SOLN: 5.0000 mg | Freq: Once | ORAL | Status: AC | PRN

## 2020-12-31 MED ORDER — ROCURONIUM BROMIDE 10 MG/ML (PF) SYRINGE
PREFILLED_SYRINGE | INTRAVENOUS | Status: DC | PRN
  Administered 2020-12-31: 50 mg via INTRAVENOUS

## 2020-12-31 MED ORDER — FLUTICASONE PROPIONATE 50 MCG/ACT NA SUSP: 1.0000 | Freq: Every day | NASAL | Status: DC | PRN

## 2020-12-31 MED ORDER — FENTANYL CITRATE (PF) 250 MCG/5ML IJ SOLN
INTRAMUSCULAR | Status: AC
  Filled 2020-12-31: qty 5

## 2020-12-31 MED ORDER — CEFAZOLIN SODIUM-DEXTROSE 2-4 GM/100ML-% IV SOLN
2.0000 g | INTRAVENOUS | Status: AC
  Administered 2020-12-31: 2 g via INTRAVENOUS
  Filled 2020-12-31: qty 100

## 2020-12-31 MED ORDER — HYDROXYZINE HCL 50 MG/ML IM SOLN
50.0000 mg | Freq: Four times a day (QID) | INTRAMUSCULAR | Status: DC | PRN
  Administered 2020-12-31: 50 mg via INTRAMUSCULAR
  Filled 2020-12-31: qty 1

## 2020-12-31 MED ORDER — LOSARTAN POTASSIUM 50 MG PO TABS: 25.0000 mg | ORAL_TABLET | Freq: Every day | ORAL | Status: DC

## 2020-12-31 MED ORDER — ONDANSETRON HCL 4 MG/2ML IJ SOLN: 4.0000 mg | Freq: Four times a day (QID) | INTRAMUSCULAR | Status: DC | PRN

## 2020-12-31 MED ORDER — HYDROCODONE-ACETAMINOPHEN 5-325 MG PO TABS
1.0000 | ORAL_TABLET | ORAL | Status: DC | PRN
Start: 2020-12-31 — End: 2021-01-01
  Administered 2020-12-31 – 2021-01-01 (×2): 1 via ORAL
  Filled 2020-12-31 (×2): qty 1

## 2020-12-31 SURGICAL SUPPLY — 53 items
BAG DECANTER FOR FLEXI CONT (MISCELLANEOUS) ×2 IMPLANT
BAND RUBBER #18 3X1/16 STRL (MISCELLANEOUS) ×4 IMPLANT
BENZOIN TINCTURE PRP APPL 2/3 (GAUZE/BANDAGES/DRESSINGS) ×2 IMPLANT
BIT DRILL 13 (BIT) ×2 IMPLANT
BUR MATCHSTICK NEURO 3.0 LAGG (BURR) ×2 IMPLANT
CAGE PEEK 6X14X11 (Cage) ×4 IMPLANT
CAGE SPNL 11X14X6XRADOPQ (Cage) ×2 IMPLANT
CANISTER SUCT 3000ML PPV (MISCELLANEOUS) ×2 IMPLANT
CARTRIDGE OIL MAESTRO DRILL (MISCELLANEOUS) ×1 IMPLANT
COLLAR CERV LO CONTOUR FIRM DE (SOFTGOODS) ×2 IMPLANT
COVER WAND RF STERILE (DRAPES) ×2 IMPLANT
DIFFUSER DRILL AIR PNEUMATIC (MISCELLANEOUS) ×2 IMPLANT
DRAPE C-ARM 42X72 X-RAY (DRAPES) ×4 IMPLANT
DRAPE LAPAROTOMY 100X72 PEDS (DRAPES) ×2 IMPLANT
DRAPE MICROSCOPE LEICA (MISCELLANEOUS) ×2 IMPLANT
DURAPREP 6ML APPLICATOR 50/CS (WOUND CARE) ×2 IMPLANT
ELECT COATED BLADE 2.86 ST (ELECTRODE) ×2 IMPLANT
ELECT REM PT RETURN 9FT ADLT (ELECTROSURGICAL) ×2
ELECTRODE REM PT RTRN 9FT ADLT (ELECTROSURGICAL) ×1 IMPLANT
GAUZE 4X4 16PLY RFD (DISPOSABLE) IMPLANT
GAUZE SPONGE 4X4 12PLY STRL (GAUZE/BANDAGES/DRESSINGS) ×2 IMPLANT
GLOVE BIO SURGEON STRL SZ8 (GLOVE) ×2 IMPLANT
GLOVE ECLIPSE 9.0 STRL (GLOVE) ×2 IMPLANT
GLOVE EXAM NITRILE XL STR (GLOVE) IMPLANT
GOWN STRL REUS W/ TWL LRG LVL3 (GOWN DISPOSABLE) IMPLANT
GOWN STRL REUS W/ TWL XL LVL3 (GOWN DISPOSABLE) ×1 IMPLANT
GOWN STRL REUS W/TWL 2XL LVL3 (GOWN DISPOSABLE) IMPLANT
GOWN STRL REUS W/TWL LRG LVL3 (GOWN DISPOSABLE)
GOWN STRL REUS W/TWL XL LVL3 (GOWN DISPOSABLE) ×2
HALTER HD/CHIN CERV TRACTION D (MISCELLANEOUS) ×2 IMPLANT
HEMOSTAT POWDER KIT SURGIFOAM (HEMOSTASIS) IMPLANT
HEMOSTAT SURGICEL 2X14 (HEMOSTASIS) IMPLANT
KIT BASIN OR (CUSTOM PROCEDURE TRAY) ×2 IMPLANT
KIT TURNOVER KIT B (KITS) ×2 IMPLANT
NEEDLE SPNL 20GX3.5 QUINCKE YW (NEEDLE) ×2 IMPLANT
NS IRRIG 1000ML POUR BTL (IV SOLUTION) ×2 IMPLANT
OIL CARTRIDGE MAESTRO DRILL (MISCELLANEOUS) ×2
PACK LAMINECTOMY NEURO (CUSTOM PROCEDURE TRAY) ×2 IMPLANT
PAD ARMBOARD 7.5X6 YLW CONV (MISCELLANEOUS) IMPLANT
PLATE ELITE 42MM (Plate) ×2 IMPLANT
SCREW ST 13X4XST VA NS SPNE (Screw) ×6 IMPLANT
SCREW ST VAR 4 ATL (Screw) ×12 IMPLANT
SPONGE INTESTINAL PEANUT (DISPOSABLE) ×2 IMPLANT
SPONGE SURGIFOAM ABS GEL SZ50 (HEMOSTASIS) ×2 IMPLANT
STRIP CLOSURE SKIN 1/2X4 (GAUZE/BANDAGES/DRESSINGS) ×2 IMPLANT
SUT VIC AB 3-0 SH 8-18 (SUTURE) ×2 IMPLANT
SUT VIC AB 4-0 RB1 18 (SUTURE) ×2 IMPLANT
TAPE CLOTH 4X10 WHT NS (GAUZE/BANDAGES/DRESSINGS) ×2 IMPLANT
TAPE CLOTH SURG 6X10 WHT LF (GAUZE/BANDAGES/DRESSINGS) ×2 IMPLANT
TOWEL GREEN STERILE (TOWEL DISPOSABLE) ×2 IMPLANT
TOWEL GREEN STERILE FF (TOWEL DISPOSABLE) ×2 IMPLANT
TRAP SPECIMEN MUCUS 40CC (MISCELLANEOUS) ×2 IMPLANT
WATER STERILE IRR 1000ML POUR (IV SOLUTION) ×2 IMPLANT

## 2020-12-31 NOTE — Transfer of Care (Signed)
Immediate Anesthesia Transfer of Care Note  Patient: Monica Ochoa  Procedure(s) Performed: Anterior Cervical Decompression Fusion  - Cervical five-Cervical six - Cervical six-Cervical seven (N/A )  Patient Location: PACU  Anesthesia Type:General  Level of Consciousness: awake, alert  and oriented  Airway & Oxygen Therapy: Patient Spontanous Breathing  Post-op Assessment: Report given to RN, Post -op Vital signs reviewed and stable and Patient moving all extremities X 4  Post vital signs: Reviewed and stable  Last Vitals:  Vitals Value Taken Time  BP 148/60 12/31/20 1010  Temp    Pulse 85 12/31/20 1011  Resp 24 12/31/20 1011  SpO2 96 % 12/31/20 1011  Vitals shown include unvalidated device data.  Last Pain:  Vitals:   12/31/20 0658  TempSrc:   PainSc: 0-No pain         Complications: No complications documented.

## 2020-12-31 NOTE — Anesthesia Procedure Notes (Signed)
Procedure Name: Intubation Date/Time: 12/31/2020 8:20 AM Performed by: Kyung Rudd, CRNA Pre-anesthesia Checklist: Patient identified, Emergency Drugs available, Suction available and Patient being monitored Patient Re-evaluated:Patient Re-evaluated prior to induction Oxygen Delivery Method: Circle system utilized Induction Type: IV induction Ventilation: Mask ventilation without difficulty Laryngoscope Size: Mac and 3 Grade View: Grade II Tube type: Oral Tube size: 7.0 mm Number of attempts: 1 Airway Equipment and Method: Stylet Placement Confirmation: ETT inserted through vocal cords under direct vision,  positive ETCO2 and breath sounds checked- equal and bilateral Secured at: 20 cm Tube secured with: Tape

## 2020-12-31 NOTE — Brief Op Note (Signed)
12/31/2020  9:57 AM  PATIENT:  Monica Ochoa  75 y.o. female  PRE-OPERATIVE DIAGNOSIS:  Stenosis  POST-OPERATIVE DIAGNOSIS:  Stenosis  PROCEDURE:  Procedure(s): Anterior Cervical Decompression Fusion  - Cervical five-Cervical six - Cervical six-Cervical seven (N/A)  SURGEON:  Surgeon(s) and Role:    * Earnie Larsson, MD - Primary  PHYSICIAN ASSISTANT:   ASSISTANTSMearl Latin   ANESTHESIA:   general  EBL:  250 mL   BLOOD ADMINISTERED:none  DRAINS: none   LOCAL MEDICATIONS USED:  NONE  SPECIMEN:  No Specimen  DISPOSITION OF SPECIMEN:  N/A  COUNTS:  YES  TOURNIQUET:  * No tourniquets in log *  DICTATION: .Dragon Dictation  PLAN OF CARE: Admit for overnight observation  PATIENT DISPOSITION:  PACU - hemodynamically stable.   Delay start of Pharmacological VTE agent (>24hrs) due to surgical blood loss or risk of bleeding: yes

## 2020-12-31 NOTE — Evaluation (Signed)
Physical Therapy Evaluation Patient Details Name: Monica Ochoa MRN: [id number] DOB: 1945-11-28 Today's Date: 12/31/2020   History of Present Illness  Pt is a 75 y/o female s/p C5-7 ACDF. PMH includes DM, HTN, and lumbar surgery.  Clinical Impression  Patient is s/p above surgery resulting in the deficits listed below (see PT Problem List). Pt reporting increased dizziness throughout mobility. Requiring min guard for short distance ambulation and began to feel nauseous, so further mobility limited. Discussed cervical precautions and generalized walking program. May benefit from outpatient PT for vestibular follow up once cleared by MD if dizziness persists at d/c.  Patient will benefit from skilled PT to increase their independence and safety with mobility (while adhering to their precautions) to allow discharge to the venue listed below.     Follow Up Recommendations Outpatient PT;Supervision for mobility/OOB (may benefit from vestibular outpatient PT when cleared by MD?)    Equipment Recommendations  Rolling walker with 5" wheels    Recommendations for Other Services       Precautions / Restrictions Precautions Precautions: Cervical;Fall Precaution Booklet Issued: Yes (comment) Precaution Comments: Reviewed cervical precautions with pt. Pt reports "dizzy" feeling at baseline when performing mobility tasks. Required Braces or Orthoses: Cervical Brace Cervical Brace: Soft collar Restrictions Weight Bearing Restrictions: No      Mobility  Bed Mobility Overal bed mobility: Needs Assistance Bed Mobility: Rolling;Sidelying to Sit;Sit to Sidelying Rolling: Min guard Sidelying to sit: Min guard     Sit to sidelying: Min guard General bed mobility comments: Min guard to ensure log roll technique    Transfers Overall transfer level: Needs assistance Equipment used: None Transfers: Sit to/from Stand Sit to Stand: Min guard         General transfer comment: Min guard for  safety. Reports some dizziness upon standing.  Ambulation/Gait Ambulation/Gait assistance: Min guard Gait Distance (Feet): 50 Feet Assistive device: IV Pole Gait Pattern/deviations: Step-through pattern;Decreased stride length Gait velocity: Decreased   General Gait Details: Very slow, guarded gait. Pt with difficulty when turning secondary to dizziness. Educated to fixate on stationary object. Pt having to close eyes at times secondary to dizziness.  Stairs            Wheelchair Mobility    Modified Rankin (Stroke Patients Only)       Balance Overall balance assessment: Needs assistance Sitting-balance support: No upper extremity supported;Feet supported Sitting balance-Leahy Scale: Fair     Standing balance support: No upper extremity supported;Single extremity supported;During functional activity Standing balance-Leahy Scale: Fair Standing balance comment: Able to maintain static standing without UE support                             Pertinent Vitals/Pain Pain Assessment: Faces Faces Pain Scale: Hurts even more Pain Location: neck Pain Descriptors / Indicators: Guarding;Grimacing Pain Intervention(s): Limited activity within patient's tolerance;Monitored during session;Repositioned    Home Living Family/patient expects to be discharged to:: Private residence Living Arrangements: Spouse/significant other Available Help at Discharge: Family Type of Home: House Home Access: Stairs to enter Entrance Stairs-Rails: None Technical brewer of Steps: 3 Home Layout: Multi-level Home Equipment: None      Prior Function Level of Independence: Independent         Comments: Reports dizziness at baseline     Hand Dominance        Extremity/Trunk Assessment   Upper Extremity Assessment Upper Extremity Assessment: Defer to OT evaluation  Lower Extremity Assessment Lower Extremity Assessment: Generalized weakness    Cervical / Trunk  Assessment Cervical / Trunk Assessment: Other exceptions Cervical / Trunk Exceptions: s/p ACDF  Communication   Communication: No difficulties  Cognition Arousal/Alertness: Awake/alert Behavior During Therapy: WFL for tasks assessed/performed Overall Cognitive Status: Within Functional Limits for tasks assessed                                        General Comments      Exercises     Assessment/Plan    PT Assessment Patient needs continued PT services  PT Problem List Decreased strength;Decreased balance;Decreased mobility;Decreased activity tolerance       PT Treatment Interventions Gait training;DME instruction;Stair training;Functional mobility training;Therapeutic activities;Therapeutic exercise;Balance training;Patient/family education    PT Goals (Current goals can be found in the Care Plan section)  Acute Rehab PT Goals Patient Stated Goal: to feel better PT Goal Formulation: With patient Time For Goal Achievement: 01/14/21 Potential to Achieve Goals: Good    Frequency Min 5X/week   Barriers to discharge        Co-evaluation               AM-PAC PT "6 Clicks" Mobility  Outcome Measure Help needed turning from your back to your side while in a flat bed without using bedrails?: A Little Help needed moving from lying on your back to sitting on the side of a flat bed without using bedrails?: A Little Help needed moving to and from a bed to a chair (including a wheelchair)?: A Little Help needed standing up from a chair using your arms (e.g., wheelchair or bedside chair)?: A Little Help needed to walk in hospital room?: A Little Help needed climbing 3-5 steps with a railing? : A Little 6 Click Score: 18    End of Session Equipment Utilized During Treatment: Gait belt Activity Tolerance: Treatment limited secondary to medical complications (Comment) (dizziness) Patient left: in bed;with call bell/phone within reach;with family/visitor  present Nurse Communication: Mobility status PT Visit Diagnosis: Unsteadiness on feet (R26.81);Muscle weakness (generalized) (M62.81);Dizziness and giddiness (R42)    Time: 9379-0240 PT Time Calculation (min) (ACUTE ONLY): 21 min   Charges:   PT Evaluation $PT Eval Low Complexity: 1 Low          Lou Miner, DPT  Acute Rehabilitation Services  Pager: [phone] Office: [phone]   Rudean Hitt 12/31/2020, 2:44 PM

## 2020-12-31 NOTE — Op Note (Signed)
Date of procedure: 12/31/2020  Date of dictation: Same  Service: Neurosurgery  Preoperative diagnosis: C5-6, C6-7 stenosis with myelopathy  Postoperative diagnosis: Same  Procedure Name: C5-6, C6-7 anterior cervical discectomy with interbody fusion utilizing interbody peek cages, locally harvested autograft, and anterior plate instrumentation  Surgeon:Sakiyah Shur A.Rooney Swails, M.D.  Asst. Surgeon: Reinaldo Meeker, NP  Anesthesia: General  Indication: 75 year old female with gait instability and feelings of incoordination.  Patient also with posterior cervical pain with upper extremity numbness and paresthesias.  Work-up demonstrates evidence of marked cervical spondylosis with severe stenosis and spinal cord compression.  Patient presents now for two-level anterior cervical decompression and fusion in hopes of improving her symptoms.  Operative note: After induction anesthesia, patient position supine with her neck slightly extended.  Patient's anterior cervical region prepped draped sterilely.  Incision made overlying C6.  Dissection performed on the right.  Retractor placed.  Fluoroscopy used.  Levels confirmed.  The spaces incised at both levels.  Discectomy was then performed using various instruments down 12 the posterior annulus.  Microscope then brought to the field used throughout the remainder of the discectomies.  Remaining aspects of annulus and osteophytes removed using high-speed drill down to level the posterior logical ligament.  Posterior logical is not elevated and resected in piecemeal fashion.  Underlying thecal sac was then identified.  A wide central decompression then performed undercutting the bodies of C5 and C6.  Decompression then proceeded to each neural foramina.  Wide anterior foraminotomies were performed on the course exiting C6 nerve roots bilaterally.  At this point a very thorough decompression been achieved.  There was no evidence of injury to thecal sac or nerve roots.  Procedures  then repeated at the C6-7 level again without complications.  Findings at that level were of a significant central disc herniation.  Wound is then irrigated.  Gelfoam was placed topically for hemostasis then removed.  [employer] anatomic 6 mm peek cages were then packed with locally harvested autograft and impacted into place at both levels.  Each cage was recessed slightly from the anterior cortical margin.  42 mm anterior cervical plate was then placed over the C5, C6, C7 levels.  This then attached under fluoroscopic guidance using 13 mm variable angle screws to each at all levels.  All 6 screws given a final tightening found to be solidly bone.  Locking screws engaged all 3 levels.  Final images reveal good position of the cages and the hardware at the proper operative level with normal alignment of spine.  Wound is then irrigated one final time.  Hemostasis was assured with bipolar cautery.  Wounds and closed in layers with Vicryl sutures.  Steri-Strips and sterile dressing were applied.  No apparent complications.  Patient tolerated the procedure well and she returned to the recovery room postop.

## 2020-12-31 NOTE — Anesthesia Preprocedure Evaluation (Signed)
Anesthesia Evaluation  Patient identified by MRN, date of birth, ID band Patient awake    Reviewed: Allergy & Precautions, H&P , NPO status , Patient's Chart, lab work & pertinent test results  Airway Mallampati: II   Neck ROM: full    Dental   Pulmonary neg pulmonary ROS,    breath sounds clear to auscultation       Cardiovascular hypertension,  Rhythm:regular Rate:Normal     Neuro/Psych    GI/Hepatic   Endo/Other  diabetes, Type 2  Renal/GU      Musculoskeletal   Abdominal   Peds  Hematology   Anesthesia Other Findings   Reproductive/Obstetrics                             Anesthesia Physical Anesthesia Plan  ASA: II  Anesthesia Plan: General   Post-op Pain Management:    Induction: Intravenous  PONV Risk Score and Plan: 3 and Ondansetron, Dexamethasone and Treatment may vary due to age or medical condition  Airway Management Planned: Oral ETT  Additional Equipment:   Intra-op Plan:   Post-operative Plan: Extubation in OR  Informed Consent: I have reviewed the patients History and Physical, chart, labs and discussed the procedure including the risks, benefits and alternatives for the proposed anesthesia with the patient or authorized representative who has indicated his/her understanding and acceptance.       Plan Discussed with: CRNA, Anesthesiologist and Surgeon  Anesthesia Plan Comments:         Anesthesia Quick Evaluation

## 2020-12-31 NOTE — H&P (Signed)
Monica Ochoa is an 75 y.o. female.   Chief Complaint: Weakness HPI: 75 year old female with a few month history of gait instability and incoordination.  Patient also with posterior cervical discomfort with some radiating numbness and paresthesias in both upper extremities.  Work-up has demonstrated evidence of significant cervical spondylosis with stenosis and spinal cord compression.  Patient presents now for two-level anterior cervical decompression and fusion in hopes of improving her symptoms.  Past Medical History:  Diagnosis Date  . Diabetes mellitus without complication ([place])   . Family history of adverse reaction to anesthesia    Sister has a hard time waking up after anesthesia and post op N/V  . Female bladder prolapse   . Hyperlipidemia 02/14/2016  . Hypertension   . Intermittent palpitations    03-23-2018 per pt caused due to anxiety/ upset  . Osteopenia 06/2019   T score -2.1 FRAX 13% / 3.3% overall stable from prior DEXA  . Palpitations   . PVC's (premature ventricular contractions) 03/17/2016  . Wears glasses     Past Surgical History:  Procedure Laterality Date  . ABDOMINAL HYSTERECTOMY  1977  . ANAL SPHINCTEROPLASTY  02/04/2011   WITH PERINEORRHAPHY  . BREAST EXCISIONAL BIOPSY Left 1989  . LUMBAR SPINE SURGERY  2000  . TOTAL VAGINAL HYSTERECTOMY  1977   W/  ANTERIOR AND POSTERIOR REPAIR    Family History  Problem Relation Age of Onset  . Hypertension Mother   . Cervical cancer Mother   . Heart disease Mother   . Uterine cancer Mother   . Angina Mother   . Diabetes Father   . Other Father        black lung  . Hypertension Brother   . Diabetes Brother   . Hypertension Sister   . Heart Problems Sister        valve problems  . Crohn's disease Sister   . Breast cancer Neg Hx    Social History:  reports that she has never smoked. She has never used smokeless tobacco. She reports that she does not drink alcohol and does not use drugs.  Allergies:   Allergies  Allergen Reactions  . Codeine Palpitations    "makes heart palpitate"    Medications Prior to Admission  Medication Sig Dispense Refill  . atorvastatin (LIPITOR) 10 MG tablet Take 10 mg by mouth every evening.    . calcium carbonate (OS-CAL) 600 MG TABS Take 600 mg by mouth 2 (two) times daily with a meal.    . carboxymethylcellulose (REFRESH PLUS) 0.5 % SOLN Place 1 drop into both eyes daily as needed (dry eyes).    . cholecalciferol (VITAMIN D) 1000 units tablet Take 1,000 Units by mouth daily.    Marland Kitchen Co-Enzyme Q-10 100 MG CAPS Take 100 mg by mouth daily.    . Flaxseed, Linseed, (FLAXSEED OIL) 1000 MG CAPS Take 1,000 mg by mouth daily.    . fluticasone (FLONASE) 50 MCG/ACT nasal spray Place 1 spray into both nostrils daily as needed for allergies or rhinitis.    Marland Kitchen losartan (COZAAR) 25 MG tablet Take 25 mg by mouth daily.    . Probiotic Product (PROBIOTIC COLON SUPPORT PO) Take 1 capsule by mouth every morning.      Results for orders placed or performed during the hospital encounter of 12/31/20 (from the past 48 hour(s))  Glucose, capillary     Status: Abnormal   Collection Time: 12/31/20  6:30 AM  Result Value Ref Range   Glucose-Capillary 123 (  H) 70 - 99 mg/dL    Comment: Glucose reference range applies only to samples taken after fasting for at least 8 hours.   No results found.  Pertinent items noted in HPI and remainder of comprehensive ROS otherwise negative.  Blood pressure (!) 162/62, pulse 78, temperature 97.9 F (36.6 C), temperature source Temporal, resp. rate 18, height 5\' 3"  (1.6 m), weight 62.6 kg, SpO2 98 %.  Patient is awake and alert.  She is oriented and appropriate.  Speech is fluent.  Judgment insight are intact.  Cranial nerve function normal bilateral.  Motor examination reveals some mild weakness of intrinsics and grip strength in both hands.  Otherwise motor strength intact.  Sensory examination with some patchy distal sensory loss in both upper  and lower extremities.  Reflexes are increased in both lower extremities equivocal in both upper extremities.  Examination head ears eyes nose and throat is unremarkable.  Chest and abdomen are benign.  Extremities are free from injury deformity. Assessment/Plan C5-6, C6-7 spondylosis with stenosis and myelopathy.  Plan C5-6 and C6-7 anterior cervical discectomy and fusion.  Risks and benefits been explained.  Patient wishes to proceed.  Mallie Mussel A Leler Brion 12/31/2020, 8:03 AM

## 2020-12-31 NOTE — Evaluation (Signed)
Occupational Therapy Evaluation Patient Details Name: Monica Ochoa MRN: [id number] DOB: 09-21-1946 Today's Date: 12/31/2020    History of Present Illness Pt is a 75 y/o female s/p C5-7 ACDF. PMH includes DM, HTN, and lumbar surgery.   Clinical Impression   PTA, pt was living with her husband and was independent. Currently, pt performing ADLs and functional mobility at [place] level. Provided education and handout on cervical precautions, bed mobility, grooming, collar management, UB ADLs, LB ADLs, toileting, and tub transfer; pt demonstrated and verbalized understanding. Pt with nausea and vomiting during session; notified RN. Answered all pt questions. Recommend dc home once medically stable per physician. All acute OT needs met and will sign off. Thank you.    Follow Up Recommendations  No OT follow up;Supervision - Intermittent    Equipment Recommendations  Other (comment) (Discussed use of shower seat; pt stating they will buy one later if needed)    Recommendations for Other Services PT consult     Precautions / Restrictions Precautions Precautions: Cervical;Fall Precaution Booklet Issued: Yes (comment) Precaution Comments: Reviewed cervical precautions with pt. Pt reports "dizzy" feeling at baseline when performing mobility tasks. Required Braces or Orthoses: Cervical Brace Cervical Brace: Soft collar Restrictions Weight Bearing Restrictions: No      Mobility Bed Mobility Overal bed mobility: Needs Assistance Bed Mobility: Rolling;Sidelying to Sit;Sit to Sidelying Rolling: Min guard Sidelying to sit: Min guard     Sit to sidelying: Min guard General bed mobility comments: Min guard to ensure log roll technique    Transfers Overall transfer level: Needs assistance Equipment used: None Transfers: Sit to/from Stand Sit to Stand: Min guard         General transfer comment: Min guard for safety. Reports some dizziness upon standing.     Balance Overall balance assessment: Needs assistance Sitting-balance support: No upper extremity supported;Feet supported Sitting balance-Leahy Scale: Fair     Standing balance support: No upper extremity supported;Single extremity supported;During functional activity Standing balance-Leahy Scale: Fair Standing balance comment: Able to maintain static standing without UE support                           ADL either performed or assessed with clinical judgement   ADL Overall ADL's : Needs assistance/impaired Eating/Feeding: Set up;Sitting   Grooming: Wash/dry face;Set up;Supervision/safety;Standing Grooming Details (indicate cue type and reason): Pt performing oral care using cups for rinse Upper Body Bathing: Supervision/ safety;Sitting   Lower Body Bathing: Min guard;Sit to/from stand   Upper Body Dressing : Supervision/safety;Sitting Upper Body Dressing Details (indicate cue type and reason): Educating pt on donning shirt and managing collar. Lower Body Dressing: Min guard;Sit to/from stand Lower Body Dressing Details (indicate cue type and reason): Pt donning underwear with Min Guard A for safety in sit<>stand Toilet Transfer: Nature conservation officer;Ambulation       Tub/ Shower Transfer: Min guard;Ambulation;Tub transfer Tub/Shower Transfer Details (indicate cue type and reason): Providing education on safe tub transfer. Min Guard A for safety Functional mobility during ADLs: Min guard General ADL Comments: Pt performed ADLs and functional mobility at [employer] A level. Providing education on cervical precautions, grooming, bed mobility, collar management, UB ADLs, LB ADLs, toileting, and tub transfer.     Vision         Perception     Praxis      Pertinent Vitals/Pain Pain Assessment: Faces Faces Pain Scale: Hurts even more Pain Location: neck Pain Descriptors /  Indicators: Guarding;Grimacing Pain Intervention(s): Monitored during  session;Limited activity within patient's tolerance;Repositioned     Hand Dominance Right   Extremity/Trunk Assessment Upper Extremity Assessment Upper Extremity Assessment: Overall WFL for tasks assessed   Lower Extremity Assessment Lower Extremity Assessment: Defer to PT evaluation   Cervical / Trunk Assessment Cervical / Trunk Assessment: Other exceptions Cervical / Trunk Exceptions: s/p ACDF   Communication Communication Communication: No difficulties   Cognition Arousal/Alertness: Awake/alert Behavior During Therapy: WFL for tasks assessed/performed Overall Cognitive Status: Within Functional Limits for tasks assessed                                     General Comments  Pt vomiting twice during session; notified RN    Exercises     Shoulder Instructions      Home Living Family/patient expects to be discharged to:: Private residence Living Arrangements: Spouse/significant other Available Help at Discharge: Family Type of Home: House Home Access: Stairs to enter Technical brewer of Steps: 3 Entrance Stairs-Rails: None Home Layout: Multi-level Alternate Level Stairs-Number of Steps: 3 to get to den Alternate Level Stairs-Rails: None Bathroom Shower/Tub: Teacher, early years/pre: Handicapped height     Home Equipment: None          Prior Functioning/Environment Level of Independence: Independent        Comments: Reports dizziness at baseline        OT Problem List: Impaired balance (sitting and/or standing);Decreased knowledge of precautions;Decreased knowledge of use of DME or AE;Pain      OT Treatment/Interventions:      OT Goals(Current goals can be found in the care plan section) Acute Rehab OT Goals Patient Stated Goal: to feel better OT Goal Formulation: All assessment and education complete, DC therapy  OT Frequency:     Barriers to D/C:            Co-evaluation              AM-PAC OT "6  Clicks" Daily Activity     Outcome Measure Help from another person eating meals?: None Help from another person taking care of personal grooming?: A Little Help from another person toileting, which includes using toliet, bedpan, or urinal?: A Little Help from another person bathing (including washing, rinsing, drying)?: A Little Help from another person to put on and taking off regular upper body clothing?: A Little Help from another person to put on and taking off regular lower body clothing?: A Little 6 Click Score: 19   End of Session Equipment Utilized During Treatment: Cervical collar Nurse Communication: Mobility status  Activity Tolerance: Patient tolerated treatment well Patient left: in bed;with call bell/phone within reach  OT Visit Diagnosis: Unsteadiness on feet (R26.81);Other abnormalities of gait and mobility (R26.89);Muscle weakness (generalized) (M62.81)                Time: 1594-5859 OT Time Calculation (min): 28 min Charges:  OT General Charges $OT Visit: 1 Visit OT Evaluation $OT Eval Low Complexity: 1 Low OT Treatments $Self Care/Home Management : 8-22 mins  Jeffifer Rabold MSOT, OTR/L Acute Rehab Pager: [phone] Office: [place] 12/31/2020, 4:24 PM

## 2021-01-01 ENCOUNTER — Encounter (HOSPITAL_COMMUNITY): Payer: Self-pay | Admitting: Neurosurgery

## 2021-01-01 DIAGNOSIS — M4712 Other spondylosis with myelopathy, cervical region: Secondary | ICD-10-CM | POA: Diagnosis not present

## 2021-01-01 LAB — GLUCOSE, CAPILLARY: Glucose-Capillary: 136 mg/dL — ABNORMAL HIGH (ref 70–99)

## 2021-01-01 MED ORDER — ONDANSETRON HCL 4 MG PO TABS: 4.0000 mg | ORAL_TABLET | Freq: Three times a day (TID) | ORAL | 0 refills | Status: DC | PRN

## 2021-01-01 MED ORDER — CYCLOBENZAPRINE HCL 10 MG PO TABS: 10.0000 mg | ORAL_TABLET | Freq: Three times a day (TID) | ORAL | 0 refills | Status: DC | PRN

## 2021-01-01 MED ORDER — HYDROCODONE-ACETAMINOPHEN 5-325 MG PO TABS: 1.0000 | ORAL_TABLET | ORAL | 0 refills | Status: DC | PRN

## 2021-01-01 NOTE — Discharge Instructions (Addendum)
Wound Care Keep incision covered and dry for two days.    You may shower Do not put any creams, lotions, or ointments on incision. Leave steri-strips on neck.  They will fall off by themselves. Activity Walk each and every day, increasing distance each day. No lifting greater than 5 lbs.  Avoid excessive neck motion. No driving for 2 weeks; may ride as a passenger locally. Diet Resume your normal diet.  Return to Work Will be discussed at your follow up appointment. Call Your Doctor If Any of These Occur Redness, drainage, or swelling at the wound.  Temperature greater than 101 degrees. Severe pain not relieved by pain medication. Incision starts to come apart. Follow Up Appt Call today for appointment in 1-2 weeks [phone]) or for problems.

## 2021-01-01 NOTE — Anesthesia Postprocedure Evaluation (Signed)
Anesthesia Post Note  Patient: Monica Ochoa  Procedure(s) Performed: Anterior Cervical Decompression Fusion  - Cervical five-Cervical six - Cervical six-Cervical seven (N/A )     Patient location during evaluation: PACU Anesthesia Type: General Level of consciousness: awake and alert Pain management: pain level controlled Vital Signs Assessment: post-procedure vital signs reviewed and stable Respiratory status: spontaneous breathing, nonlabored ventilation, respiratory function stable and patient connected to nasal cannula oxygen Cardiovascular status: blood pressure returned to baseline and stable Postop Assessment: no apparent nausea or vomiting Anesthetic complications: no   No complications documented.  Last Vitals:  Vitals:   01/01/21 0350 01/01/21 0802  BP: (!) 104/51 (!) 103/46  Pulse: 76 76  Resp: 18 16  Temp: 36.7 C 36.7 C  SpO2: 96% 99%    Last Pain:  Vitals:   01/01/21 0802  TempSrc: Oral  PainSc:                  [place]

## 2021-01-01 NOTE — Discharge Summary (Signed)
Physician Discharge Summary     Providing Compassionate, Quality Care - Together   Patient ID: Monica Ochoa MRN: [id number] DOB/AGE: Mar 07, 1946 75 y.o.  Admit date: 12/31/2020 Discharge date: 01/01/2021  Admission Diagnoses: Cervical myelopathy  Discharge Diagnoses:  Active Problems:   Cervical myelopathy [facility])   Discharged Condition: good  Hospital Course: Patient was admitted to 3C04 following C5-6, C6-7 by Dr. Annette Stable on 12/31/2020. Her postoperative course has been uncomplicated. She is tolerating food well. She is having no issues with swallowing or complaints of shortness of breath. Her pain is well-controlled with oral pain medication. She has worked with physical therapy. She is ready for discharge home.   Consults: rehabilitation medicine  Significant Diagnostic Studies: radiology: DG Cervical Spine 1 View  Result Date: 12/31/2020 CLINICAL DATA:  Anterior fusion EXAM: DG CERVICAL SPINE - 1 VIEW; DG C-ARM 1-60 MIN COMPARISON:  Cervical MRI November 21, 2020 FLUOROSCOPY TIME:  0 minutes 4 seconds; 0.34 mGy; 1 acquired image FINDINGS: Lateral lumbar image submitted. There is anterior screw and plate fixation at C5, C6, and C7 with disc spacers at C5-6 and C6-7. No fracture or spondylolisthesis. Disc spaces appear intact on lateral view. IMPRESSION: Anterior screw and plate fixation from K5-G2 with disc spacers at C5-6 and C6-7. Support hardware appears intact on lateral view. No appreciable disc space narrowing. No fracture or spondylolisthesis. Electronically Signed   By: Lowella Grip III M.D.   On: 12/31/2020 09:59   DG C-Arm 1-60 Min  Result Date: 12/31/2020 CLINICAL DATA:  Anterior fusion EXAM: DG CERVICAL SPINE - 1 VIEW; DG C-ARM 1-60 MIN COMPARISON:  Cervical MRI November 21, 2020 FLUOROSCOPY TIME:  0 minutes 4 seconds; 0.34 mGy; 1 acquired image FINDINGS: Lateral lumbar image submitted. There is anterior screw and plate fixation at C5, C6, and C7 with disc spacers at  C5-6 and C6-7. No fracture or spondylolisthesis. Disc spaces appear intact on lateral view. IMPRESSION: Anterior screw and plate fixation from B6-L8 with disc spacers at C5-6 and C6-7. Support hardware appears intact on lateral view. No appreciable disc space narrowing. No fracture or spondylolisthesis. Electronically Signed   By: Lowella Grip III M.D.   On: 12/31/2020 09:59    Treatments: surgery: C5-6, C6-7 anterior cervical discectomy with interbody fusion utilizing interbody peek cages, locally harvested autograft, and anterior plate instrumentation  Discharge Exam: Blood pressure (!) 103/46, pulse 76, temperature 98.1 F (36.7 C), temperature source Oral, resp. rate 16, height 5\' 3"  (1.6 m), weight 62.6 kg, SpO2 99 %.   Alert and oriented x 4 PERRLA CN II-XII grossly intact MAE, Strength and sensation intact Incision is covered with gauze dressing and Steri Strips; Dressing is clean, dry, and intact   Disposition: Discharge disposition: 01-Home or Self Care        Allergies as of 01/01/2021      Reactions   Codeine Palpitations   "makes heart palpitate"      Medication List    TAKE these medications   atorvastatin 10 MG tablet Commonly known as: LIPITOR Take 10 mg by mouth every evening.   calcium carbonate 600 MG Tabs tablet Commonly known as: OS-CAL Take 600 mg by mouth 2 (two) times daily with a meal.   carboxymethylcellulose 0.5 % Soln Commonly known as: REFRESH PLUS Place 1 drop into both eyes daily as needed (dry eyes).   cholecalciferol 1000 units tablet Commonly known as: VITAMIN D Take 1,000 Units by mouth daily.   Co-Enzyme Q-10 100 MG Caps Take 100  mg by mouth daily.   cyclobenzaprine 10 MG tablet Commonly known as: FLEXERIL Take 1 tablet (10 mg total) by mouth 3 (three) times daily as needed for muscle spasms.   Flaxseed Oil 1000 MG Caps Take 1,000 mg by mouth daily.   fluticasone 50 MCG/ACT nasal spray Commonly known as: FLONASE Place  1 spray into both nostrils daily as needed for allergies or rhinitis.   HYDROcodone-acetaminophen 5-325 MG tablet Commonly known as: NORCO/VICODIN Take 1 tablet by mouth every 4 (four) hours as needed for moderate pain.   losartan 25 MG tablet Commonly known as: COZAAR Take 25 mg by mouth daily.   ondansetron 4 MG tablet Commonly known as: ZOFRAN Take 1 tablet (4 mg total) by mouth every 8 (eight) hours as needed for nausea or vomiting.   PROBIOTIC COLON SUPPORT PO Take 1 capsule by mouth every morning.       Follow-up Information    Earnie Larsson, MD. Go on 01/10/2021.   Specialty: Neurosurgery Why: Appointment is at 3:45 pm Contact information: [address] [place] [address] [place] [address] [phone]               Signed: Patricia Nettle 01/01/2021, 9:48 AM

## 2021-01-01 NOTE — Progress Notes (Signed)
Physical Therapy Treatment Patient Details Name: Monica Ochoa MRN: [id number] DOB: 04-14-1946 Today's Date: 01/01/2021    History of Present Illness Pt is a 75 y/o female s/p C5-7 ACDF. PMH includes DM, HTN, and lumbar surgery.    PT Comments    Pt progressing towards physical therapy goals. Was able to perform transfers and ambulation with gross min guard assist to supervision for safety and no AD. Pt was educated on precautions, car transfer, appropriate activity progression, and positioning recommendations. Pt anticipates d/c home today. Will continue to follow.     Follow Up Recommendations  Outpatient PT     Equipment Recommendations  Rolling walker with 5" wheels    Recommendations for Other Services       Precautions / Restrictions Precautions Precautions: Cervical;Fall Precaution Booklet Issued: Yes (comment) Precaution Comments: Reviewed cervical precautions with pt. Pt reports "dizzy" feeling at baseline when performing mobility tasks. Required Braces or Orthoses: Cervical Brace Cervical Brace: Soft collar Restrictions Weight Bearing Restrictions: No    Mobility  Bed Mobility               General bed mobility comments: Pt was received sitting up on EOB when PT arrived.    Transfers Overall transfer level: Needs assistance Equipment used: None Transfers: Sit to/from Stand Sit to Stand: Supervision         General transfer comment: Pt demonstrated proper hand placement on seated surface for safety. No assist required.  Ambulation/Gait Ambulation/Gait assistance: Supervision;Min guard Gait Distance (Feet): 200 Feet Assistive device: None Gait Pattern/deviations: Step-through pattern;Decreased stride length Gait velocity: Decreased Gait velocity interpretation: <1.8 ft/sec, indicate of risk for recurrent falls General Gait Details: Slow and guarded without overt LOB. Pt reports feeling woozy but no dizzy and denies  vertigo.   Stairs Stairs: Yes Stairs assistance: Min guard Stair Management: One rail Right;Step to pattern;Forwards Number of Stairs: 3 General stair comments: VC's for sequencing and general safety. No assist required.   Wheelchair Mobility    Modified Rankin (Stroke Patients Only)       Balance Overall balance assessment: Needs assistance Sitting-balance support: No upper extremity supported;Feet supported Sitting balance-Leahy Scale: Fair     Standing balance support: No upper extremity supported;Single extremity supported;During functional activity Standing balance-Leahy Scale: Fair Standing balance comment: Able to maintain static standing without UE support                            Cognition Arousal/Alertness: Awake/alert Behavior During Therapy: WFL for tasks assessed/performed Overall Cognitive Status: Within Functional Limits for tasks assessed                                        Exercises      General Comments        Pertinent Vitals/Pain Pain Assessment: Faces Faces Pain Scale: Hurts even more Pain Location: neck Pain Descriptors / Indicators: Guarding;Grimacing Pain Intervention(s): Limited activity within patient's tolerance;Monitored during session;Repositioned    Home Living                      Prior Function            PT Goals (current goals can now be found in the care plan section) Acute Rehab PT Goals Patient Stated Goal: to feel better PT Goal Formulation: With patient Time For Goal  Achievement: 01/14/21 Potential to Achieve Goals: Good Progress towards PT goals: Progressing toward goals    Frequency    Min 5X/week      PT Plan Current plan remains appropriate    Co-evaluation              AM-PAC PT "6 Clicks" Mobility   Outcome Measure  Help needed turning from your back to your side while in a flat bed without using bedrails?: None Help needed moving from lying on  your back to sitting on the side of a flat bed without using bedrails?: A Little Help needed moving to and from a bed to a chair (including a wheelchair)?: None Help needed standing up from a chair using your arms (e.g., wheelchair or bedside chair)?: None Help needed to walk in hospital room?: A Little Help needed climbing 3-5 steps with a railing? : A Little 6 Click Score: 21    End of Session Equipment Utilized During Treatment: Gait belt Activity Tolerance: Patient tolerated treatment well Patient left: in bed;with call bell/phone within reach Nurse Communication: Mobility status PT Visit Diagnosis: Unsteadiness on feet (R26.81);Muscle weakness (generalized) (M62.81);Dizziness and giddiness (R42)     Time: 2633-3545 PT Time Calculation (min) (ACUTE ONLY): 14 min  Charges:  $Gait Training: 8-22 mins                     [name], PT, DPT Acute Rehabilitation Services Pager: [phone] Office: [phone]    [name] 01/01/2021, 11:23 AM

## 2021-01-01 NOTE — Plan of Care (Signed)
Patient is discharged from room 3C04 at this time. Alert and in stable condition. IV site d/c'd and instructions read to patient with understanding verbalized and all questions answered. Left unit via wheelchair with all belongings at side. 

## 2021-01-08 ENCOUNTER — Ambulatory Visit: Payer: Medicare Other | Admitting: Neurology

## 2021-02-25 ENCOUNTER — Telehealth: Payer: Self-pay | Admitting: Neurology

## 2021-02-25 MED ORDER — PREDNISONE 10 MG PO TABS
ORAL_TABLET | ORAL | 0 refills | Status: DC
Start: 2021-02-25 — End: 2021-03-14

## 2021-02-25 NOTE — Telephone Encounter (Signed)
She would need to make an appointment so that I can evaluate.

## 2021-02-25 NOTE — Telephone Encounter (Signed)
Patient called in stating she had been having dizziness and nausea before seeing Dr. Tomi Likens a few months ago. She then had found out she had some issues with the discs in her neck. She had surgery and the symptoms had gone away since the surgery. The dizziness and nausea have recently come back to where she is having some trouble walking. She has also noticed some issues with her eyes when scrolling on her phone. She says it is so debilitating that she may not go on her vacation next week.

## 2021-02-25 NOTE — Telephone Encounter (Signed)
In the meantime, we can send a prednisone taper to stop this dizzy attack - prednisone 10mg  tablet:  take 60mg  on day 1, then 50mg  on day 2, then 40mg  on day 3, then 30mg  on day 4, then 20mg  on day 5, then 10mg  on day 6, then STOP.

## 2021-02-25 NOTE — Telephone Encounter (Signed)
Spoke with pt we can send a prednisone taper to stop this dizzy attack - prednisone 10mg  tablet:  take 60mg  on day 1, then 50mg  on day 2, then 40mg  on day 3, then 30mg  on day 4, then 20mg  on day 5, then 10mg  on day 6, then STOP. Script sent to pharmacy, pt transferred to the front to get scheduled for appointment

## 2021-02-27 ENCOUNTER — Telehealth: Payer: Self-pay | Admitting: Neurology

## 2021-02-27 NOTE — Telephone Encounter (Signed)
Patient needs ;etter for insurance for Vacation she feels liek her [employer] is not going to allow her to go  She goes on [facility] Sunday 03-03-21

## 2021-02-27 NOTE — Telephone Encounter (Signed)
Per pt she needs a letter for travel insurance stating that the pt is unable to keep her vacation plans she has for this weekend.

## 2021-02-28 ENCOUNTER — Emergency Department (HOSPITAL_COMMUNITY): Payer: Medicare Other

## 2021-02-28 ENCOUNTER — Emergency Department (HOSPITAL_COMMUNITY)
Admission: EM | Admit: 2021-02-28 | Discharge: 2021-02-28 | Disposition: A | Discharge status: Home / Self Care | Payer: Medicare Other | Attending: Emergency Medicine | Admitting: Emergency Medicine

## 2021-02-28 ENCOUNTER — Other Ambulatory Visit: Payer: Self-pay

## 2021-02-28 ENCOUNTER — Encounter (HOSPITAL_COMMUNITY): Payer: Self-pay | Admitting: Emergency Medicine

## 2021-02-28 DIAGNOSIS — Z79899 Other long term (current) drug therapy: Secondary | ICD-10-CM | POA: Diagnosis not present

## 2021-02-28 DIAGNOSIS — R42 Dizziness and giddiness: Secondary | ICD-10-CM | POA: Diagnosis not present

## 2021-02-28 DIAGNOSIS — R110 Nausea: Secondary | ICD-10-CM | POA: Insufficient documentation

## 2021-02-28 DIAGNOSIS — R55 Syncope and collapse: Secondary | ICD-10-CM | POA: Diagnosis present

## 2021-02-28 DIAGNOSIS — I951 Orthostatic hypotension: Secondary | ICD-10-CM | POA: Insufficient documentation

## 2021-02-28 DIAGNOSIS — E119 Type 2 diabetes mellitus without complications: Secondary | ICD-10-CM | POA: Diagnosis not present

## 2021-02-28 DIAGNOSIS — Z7952 Long term (current) use of systemic steroids: Secondary | ICD-10-CM | POA: Insufficient documentation

## 2021-02-28 DIAGNOSIS — I10 Essential (primary) hypertension: Secondary | ICD-10-CM | POA: Diagnosis not present

## 2021-02-28 DIAGNOSIS — R9402 Abnormal brain scan: Secondary | ICD-10-CM | POA: Diagnosis not present

## 2021-02-28 DIAGNOSIS — R9089 Other abnormal findings on diagnostic imaging of central nervous system: Secondary | ICD-10-CM

## 2021-02-28 LAB — CBC WITH DIFFERENTIAL/PLATELET
Abs Immature Granulocytes: 0.01 10*3/uL (ref 0.00–0.07)
Basophils Absolute: 0.1 10*3/uL (ref 0.0–0.1)
Basophils Relative: 1 %
Eosinophils Absolute: 0.1 10*3/uL (ref 0.0–0.5)
Eosinophils Relative: 1 %
HCT: 40.5 % (ref 36.0–46.0)
Hemoglobin: 13.6 g/dL (ref 12.0–15.0)
Immature Granulocytes: 0 %
Lymphocytes Relative: 29 %
Lymphs Abs: 2 10*3/uL (ref 0.7–4.0)
MCH: 28.5 pg (ref 26.0–34.0)
MCHC: 33.6 g/dL (ref 30.0–36.0)
MCV: 84.9 fL (ref 80.0–100.0)
Monocytes Absolute: 0.5 10*3/uL (ref 0.1–1.0)
Monocytes Relative: 8 %
Neutro Abs: 4.1 10*3/uL (ref 1.7–7.7)
Neutrophils Relative %: 61 %
Platelets: 265 10*3/uL (ref 150–400)
RBC: 4.77 MIL/uL (ref 3.87–5.11)
RDW: 12.3 % (ref 11.5–15.5)
WBC: 6.7 10*3/uL (ref 4.0–10.5)
nRBC: 0 % (ref 0.0–0.2)

## 2021-02-28 LAB — BASIC METABOLIC PANEL
Anion gap: 10 (ref 5–15)
BUN: 9 mg/dL (ref 8–23)
CO2: 24 mmol/L (ref 22–32)
Calcium: 9.4 mg/dL (ref 8.9–10.3)
Chloride: 99 mmol/L (ref 98–111)
Creatinine, Ser: 0.78 mg/dL (ref 0.44–1.00)
GFR, Estimated: >60 mL/min (ref >60)
Glucose, Bld: 126 mg/dL — ABNORMAL HIGH (ref 70–99)
Potassium: 3.3 mmol/L — ABNORMAL LOW (ref 3.5–5.1)
Sodium: 133 mmol/L — ABNORMAL LOW (ref 135–145)

## 2021-02-28 LAB — PHOSPHORUS: Phosphorus: 3 mg/dL (ref 2.5–4.6)

## 2021-02-28 LAB — TROPONIN I (HIGH SENSITIVITY)
Troponin I (High Sensitivity): 3 ng/L (ref <18)
Troponin I (High Sensitivity): 4 ng/L (ref <18)

## 2021-02-28 LAB — MAGNESIUM: Magnesium: 2.2 mg/dL (ref 1.7–2.4)

## 2021-02-28 LAB — TSH: TSH: 1.878 u[IU]/mL (ref 0.350–4.500)

## 2021-02-28 IMAGING — MR MR HEAD WO/W CM
11 of 20 series · 28 of 48 positions shown · IV contrast (6     GAD)
Comparison: MRI head [DATE]. MRI cervical spine [DATE].

CLINICAL DATA: Dizziness.  Headache.  Tingling in arms.

EXAM:
MRI HEAD WITHOUT AND WITH CONTRAST
MRI CERVICAL SPINE WITHOUT AND WITH CONTRAST
TECHNIQUE: Multiplanar, multiecho pulse sequences of the brain and surrounding
structures were obtained without and with intravenous contrast.
Multiplanar and multiecho pulse sequences of the cervical spine, to
include the craniocervical junction and cervicothoracic junction,
were obtained without and with intravenous contrast.
CONTRAST:  6mL GADAVIST GADOBUTROL 1 MMOL/ML IV SOLN

[Series 3: DWI · axial · 3.0mm · 1.09mm/px · z∈[-30,+105]mm · 6 of 92 slices shown (1 of 4)]
[im 1/92]
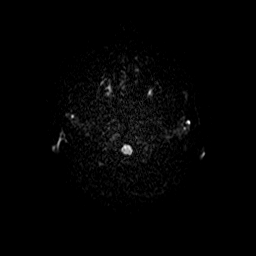
[im 19/92]
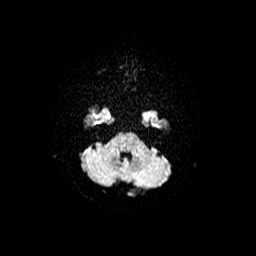
[im 37/92]
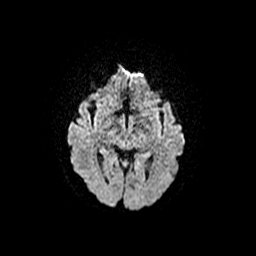
[im 55/92]
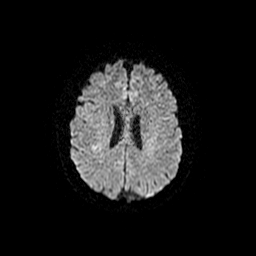
[im 73/92]
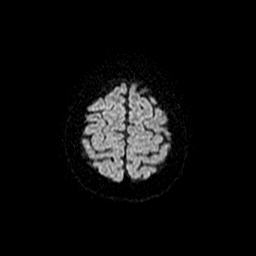
[im 92/92]
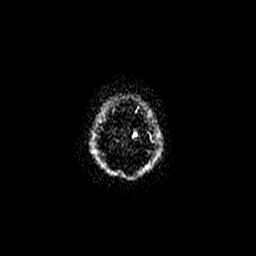

[Series 4: DWI · coronal · 5.0mm · 1.09mm/px · 4 of 66 slices shown (2 of 4)]
[im 1/66]
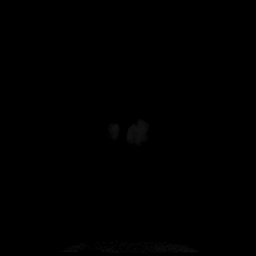
[im 22/66]
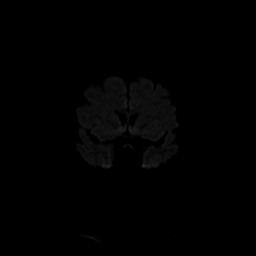
[im 44/66]
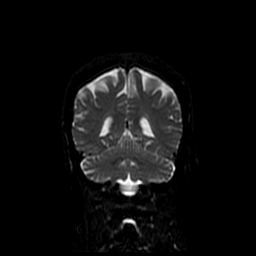
[im 66/66]
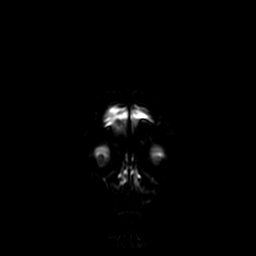

[Series 6: T2 · axial · 5.0mm · 0.43mm/px · z∈[-25,+113]mm · 2 of 24 slices shown (1 of 3)]
[im 1/24]
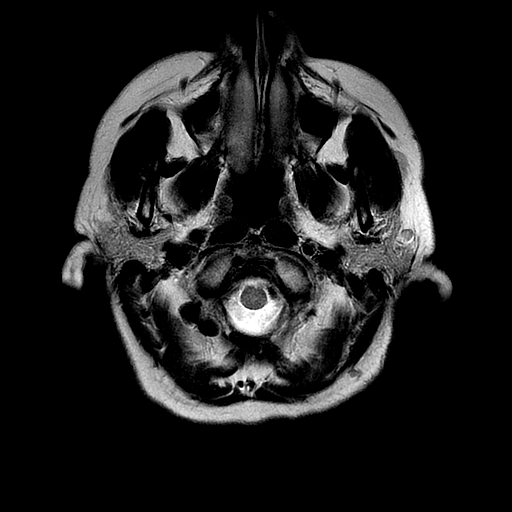
[im 24/24]
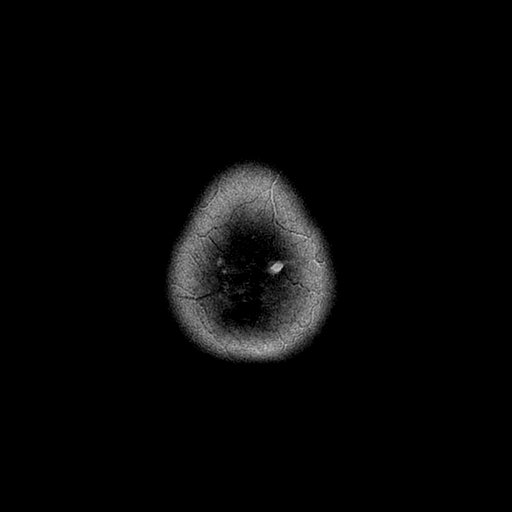

[Series 7: FLAIR · axial · 3.0mm · 0.43mm/px · z∈[-25,+113]mm · 2 of 24 slices shown]
[im 1/24]
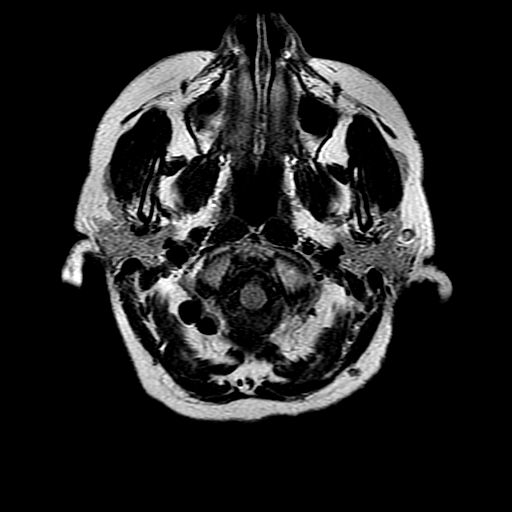
[im 24/24]
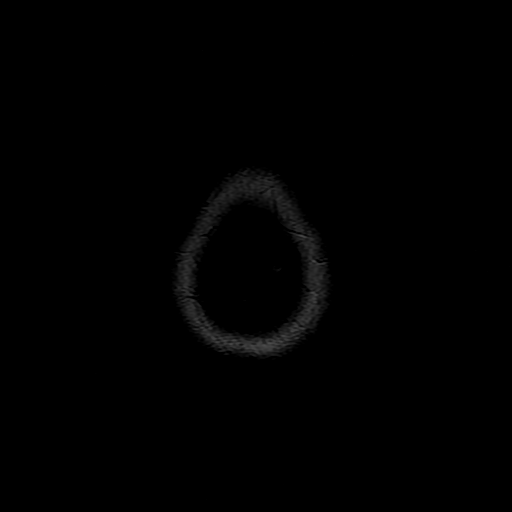

[Series 12: T2 · sagittal · 3.0mm · 0.47mm/px · 1 of 12 slices shown (2 of 3)]
[im 1/12]
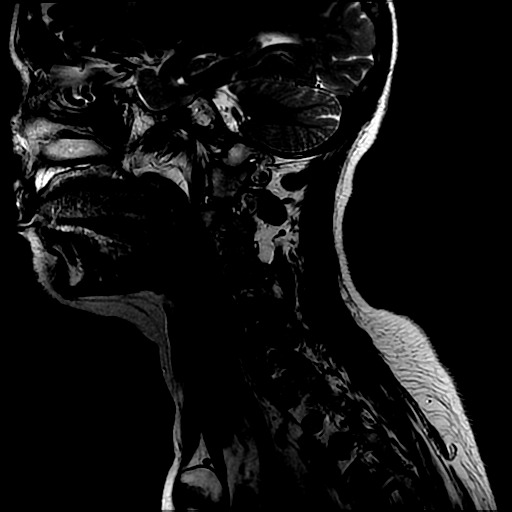

[Series 16: T2 · axial · 4.0mm · 0.35mm/px · z∈[-180,-86]mm · 2 of 24 slices shown (3 of 3)]
[im 1/24]
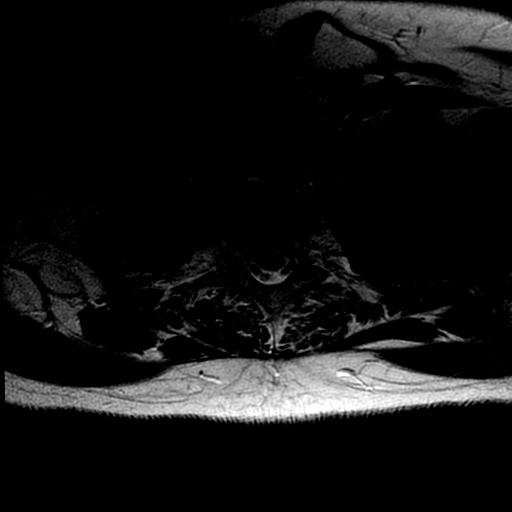
[im 24/24]
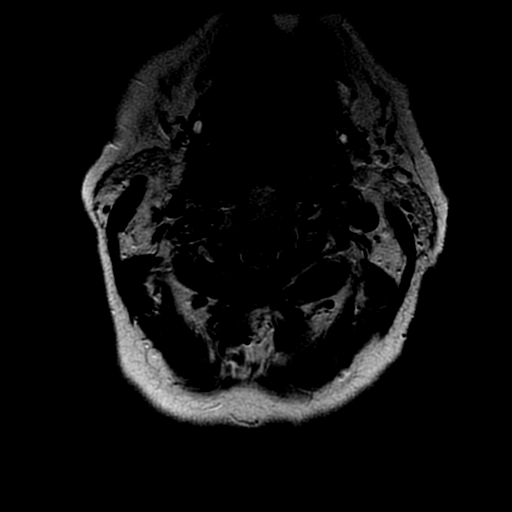

[Series 19: T2 post-contrast · coronal · 5.0mm · 0.39mm/px · 1 of 25 slices shown]
[im 1/25]
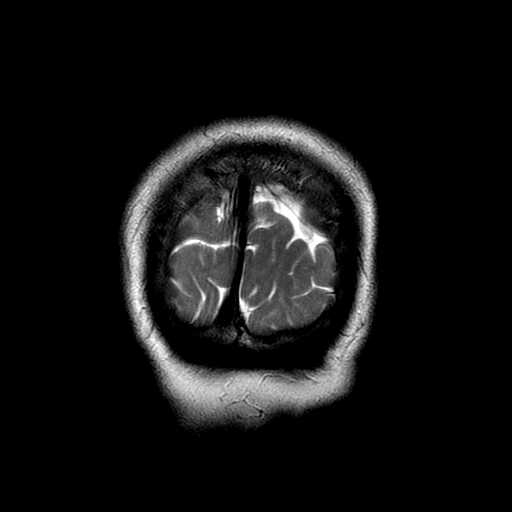

[Series 20: T1 post-contrast · axial · 3.0mm · 0.47mm/px · z∈[-28,+113]mm · 3 of 48 slices shown (1 of 2)]
[im 1/48]
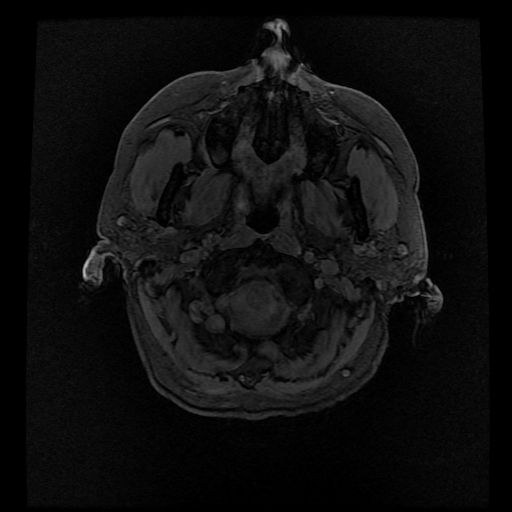
[im 24/48]
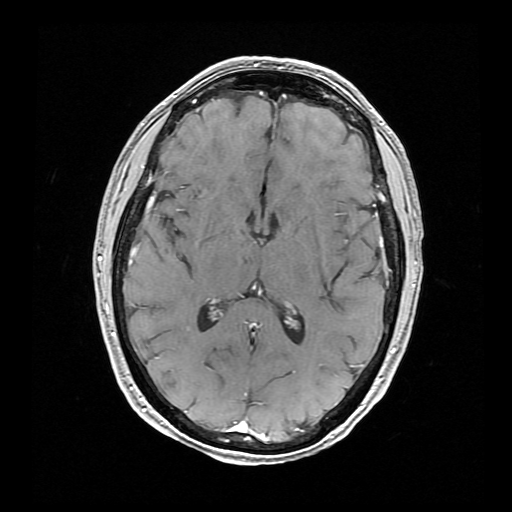
[im 48/48]
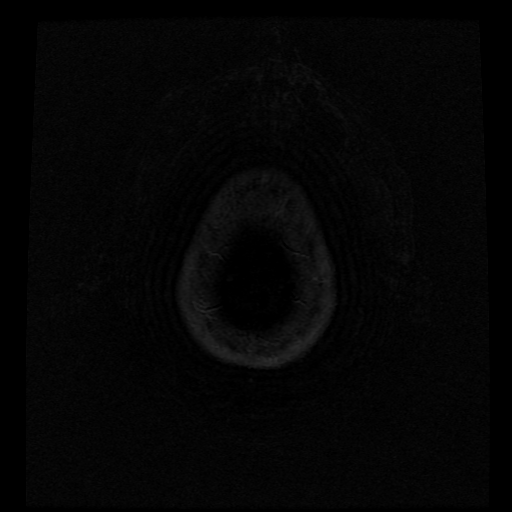

[Series 21: T1 post-contrast · coronal · 5.0mm · 0.39mm/px · 2 of 25 slices shown (2 of 2)]
[im 1/25]
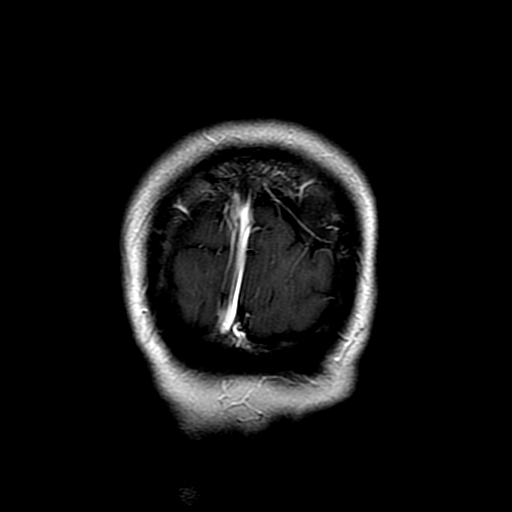
[im 25/25]
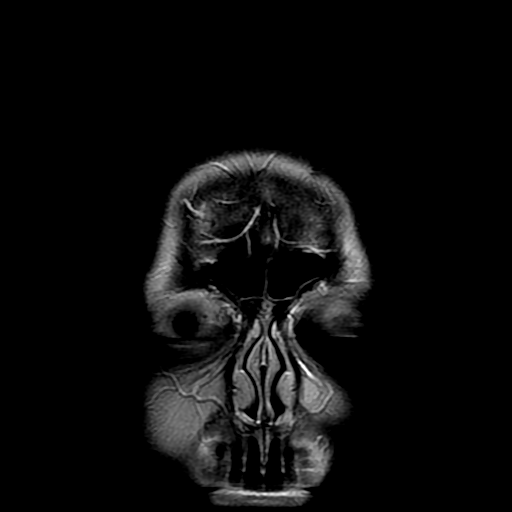

[Series 300: DWI · axial · 3.0mm · 1.09mm/px · z∈[-30,+105]mm · 3 of 46 slices shown (3 of 4)]
[im 1/46]
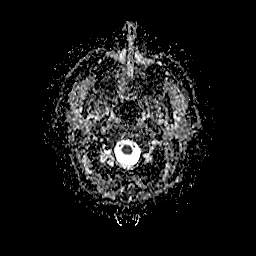
[im 23/46]
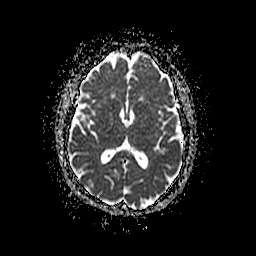
[im 46/46]
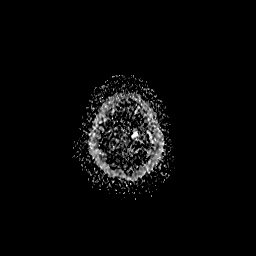

[Series 400: DWI · coronal · 5.0mm · 1.09mm/px · 2 of 33 slices shown (4 of 4)]
[im 1/33]
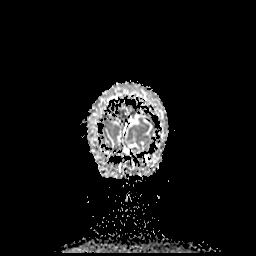
[im 33/33]
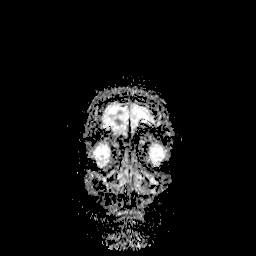

[28 of 48 positions shown; findings below may reference images not displayed]

FINDINGS: Brain findings:

Brain: No evidence of true restricted diffusion to suggest acute
infarct. Mild DWI hyperintensity associated with a right frontal and
high left posterior frontal T2/FLAIR white matter hyperintensities
demonstrate correlate ADC hyperintensity, compatible with T2 shine
through. No hydrocephalus. No mass lesion or abnormal mass effect.
No extra-axial fluid collection. Similar appearance of
periventricular and juxtacortical T2/FLAIR hyperintensities, some of
which appear ovoid or rounded in appearance. Similar wispy
enhancement associated with a prominent rounded right parietal
lesion (series 20, image 30), which also demonstrates T1
hypointensity. Additional faint enhancement associated with a high
left frontal lesion likely was present on the prior.

Vascular: Major arterial flow voids are maintained skull base.

Skull and upper cervical spine: Normal marrow signal.

Sinuses/Orbits: Mild ethmoid air cell mucosal thickening. Otherwise,
clear visualized sinuses. Unremarkable orbits.

Other: No mastoid effusions.

Cervical spine findings:

Alignment: No substantial sagittal subluxation. Straightening of the
normal cervical lordosis.

Vertebrae: Interval C5-C7 ACDF. Vertebral body heights are
maintained. No specific evidence of acute fracture,
discitis/osteomyelitis, or suspicious bone lesion. No evidence of
abnormal enhancement. Metallic artifact from fusion hardware limits
evaluation at these levels.

Cord: Normal cord signal.  No abnormal enhancement.

Vertebral arteries, paraspinal tissues: Visualized vertebral artery
flow voids are maintained. Unremarkable paraspinal soft tissues.

Disc levels:

C2-C3: No significant disc protrusion, foraminal stenosis, or canal
stenosis.

C3-C4: Similar mild posterior disc osteophyte complex and mild
bilateral facet/uncovertebral hypertrophy without significant canal
or foraminal stenosis.

C4-C5: Similar mild posterior disc osteophyte complex and mild
bilateral facet/uncovertebral hypertrophy without significant canal
or foraminal stenosis.

C5-C6: ACDF. Improved canal and foraminal stenosis without evidence
of significant residual canal or foraminal stenosis.

C6-C7: ACDF.Improved canal and foraminal stenosis without evidence
of significant residual canal or foraminal stenosis.

C7-T1: No significant disc protrusion, foraminal stenosis, or canal
stenosis.

No significant canal stenosis in the imaged upper thoracic spine.
IMPRESSION: MRI head:

Similar mild enhancement of a right parietal rounded T2/FLAIR
hyperintense white matter lesion, suspicious for active
demyelination. Additional faint enhancement associated with a high
left frontal white matter lesion likely was present on the prior and
may represent an additional site of active demyelination. No
substantial change in additional T2/FLAIR hyperintense foci.

MRI cervical spine:

1. Interval C5-C7 ACDF without significant canal or foraminal
stenosis.
2. Normal cord signal without enhancement.

## 2021-02-28 IMAGING — MR MR CERVICAL SPINE WO/W CM
7 of 20 series · 17 of 48 positions shown · IV contrast (gadavist)
Comparison: MRI head [DATE]. MRI cervical spine [DATE].

CLINICAL DATA: Dizziness.  Headache.  Tingling in arms.

EXAM:
MRI HEAD WITHOUT AND WITH CONTRAST
MRI CERVICAL SPINE WITHOUT AND WITH CONTRAST
TECHNIQUE: Multiplanar, multiecho pulse sequences of the brain and surrounding
structures were obtained without and with intravenous contrast.
Multiplanar and multiecho pulse sequences of the cervical spine, to
include the craniocervical junction and cervicothoracic junction,
were obtained without and with intravenous contrast.
CONTRAST:  6mL GADAVIST GADOBUTROL 1 MMOL/ML IV SOLN

[Series 5: T1 · sagittal · 5.0mm · 0.47mm/px · 1 of 23 slices shown (1 of 3)]
[im 1/23]
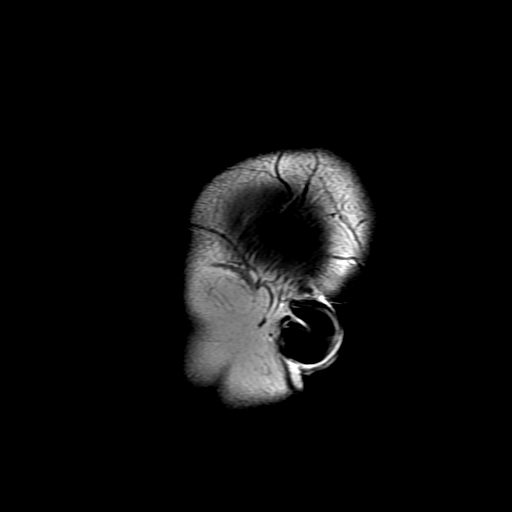

[Series 6: T2 · axial · 5.0mm · 0.43mm/px · z∈[-25,+113]mm · 2 of 24 slices shown (1 of 3)]
[im 1/24]
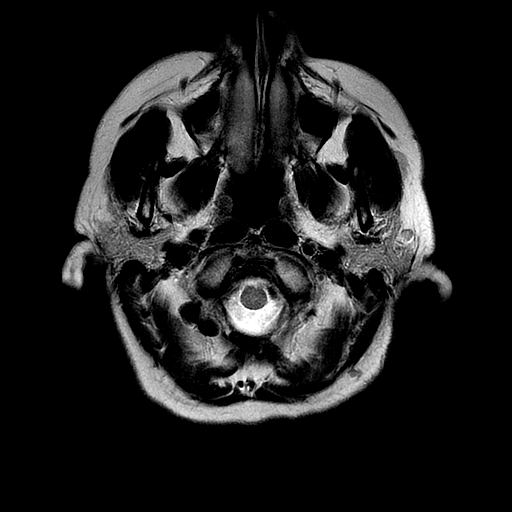
[im 24/24]
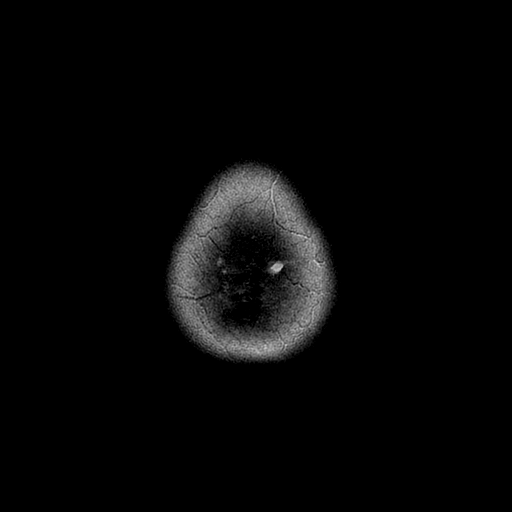

[Series 9: T1 · axial · 3.0mm · 0.47mm/px · z∈[-28,+114]mm · 7 of 96 slices shown (2 of 3)]
[im 1/96]
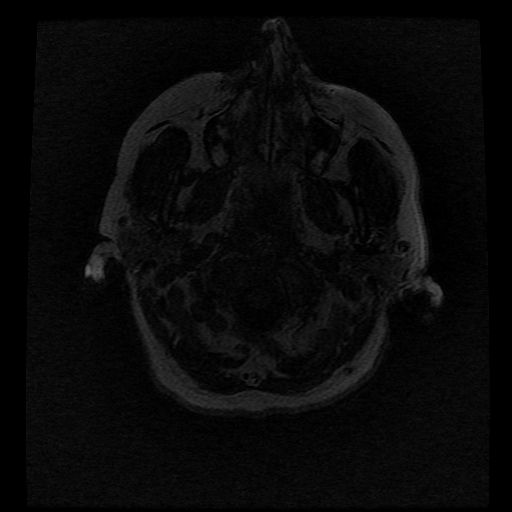
[im 16/96]
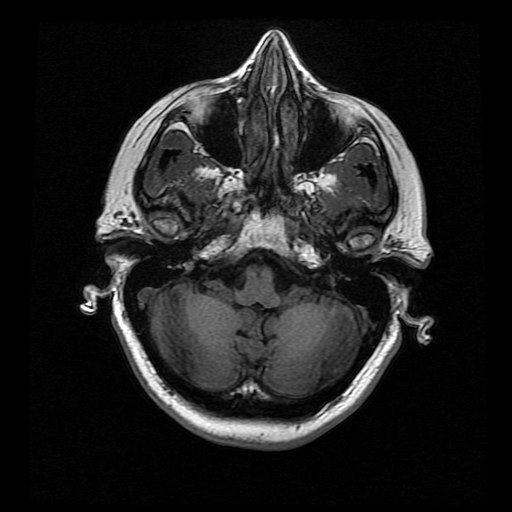
[im 32/96]
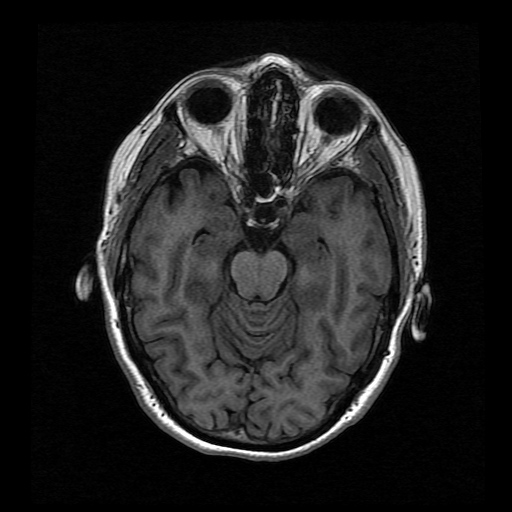
[im 48/96]
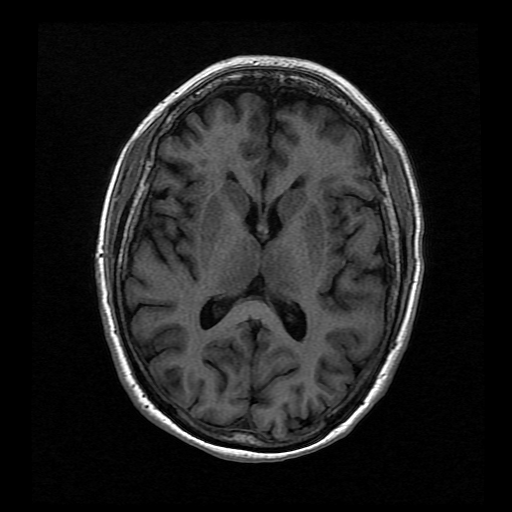
[im 64/96]
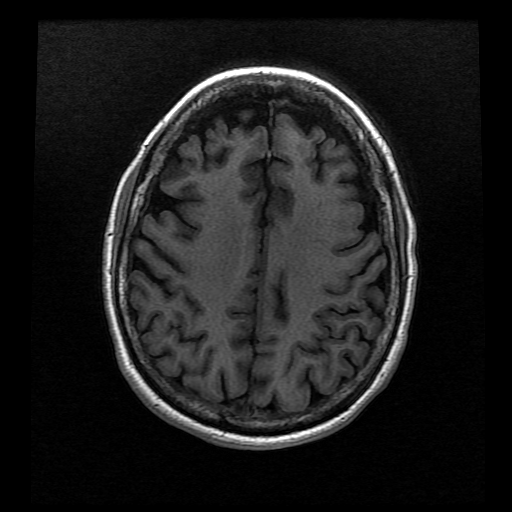
[im 80/96]
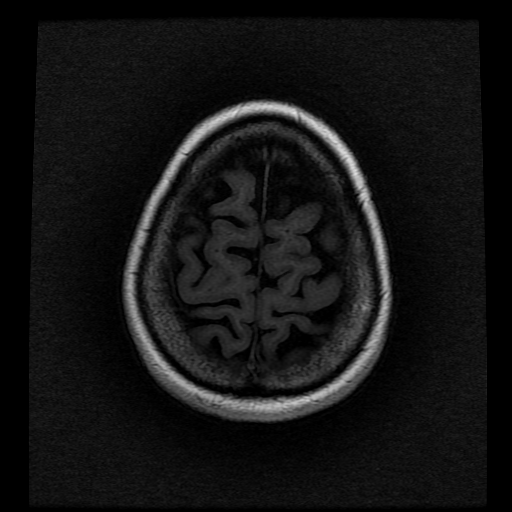
[im 96/96]
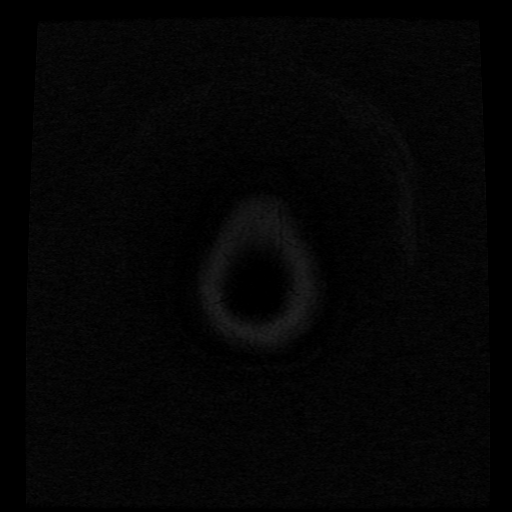

[Series 12: T2 · sagittal · 3.0mm · 0.47mm/px · 1 of 12 slices shown (2 of 3)]
[im 1/12]
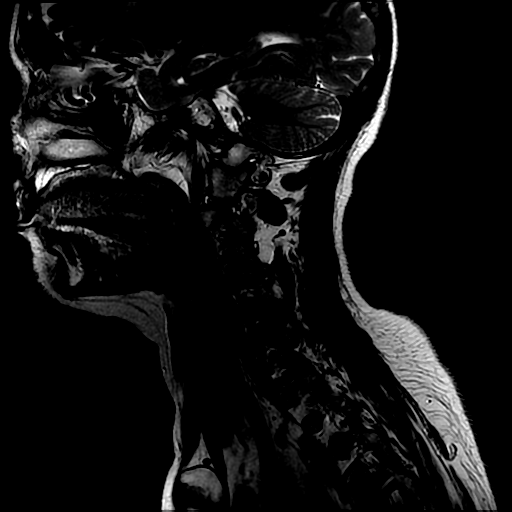

[Series 16: T2 · axial · 4.0mm · 0.35mm/px · z∈[-180,-86]mm · 2 of 24 slices shown (3 of 3)]
[im 1/24]
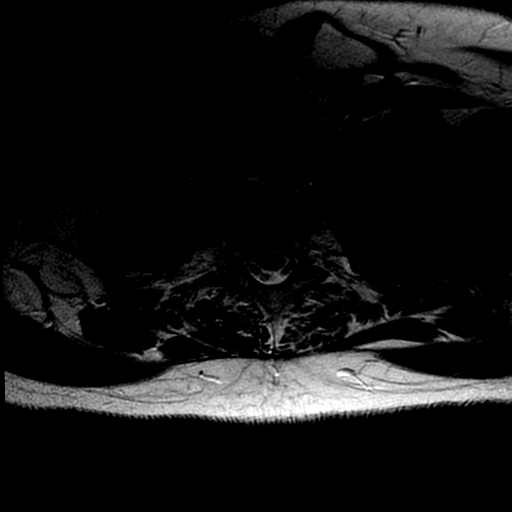
[im 24/24]
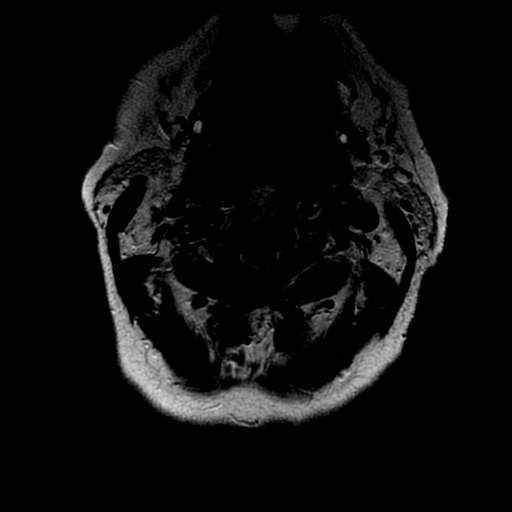

[Series 17: T1 · axial · non-contrast · 4.0mm · 0.35mm/px · z∈[-180,-86]mm · 2 of 24 slices shown (3 of 3)]
[im 1/24]
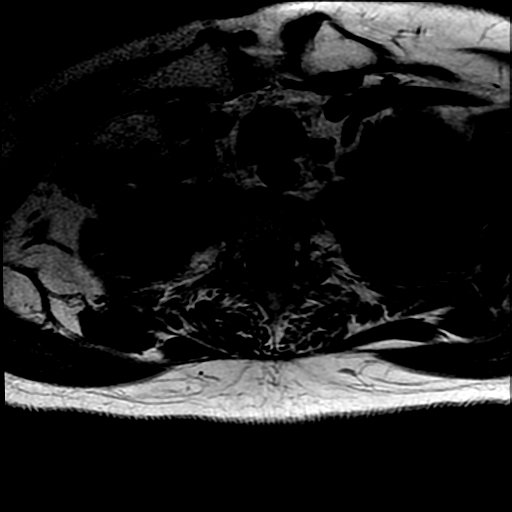
[im 24/24]
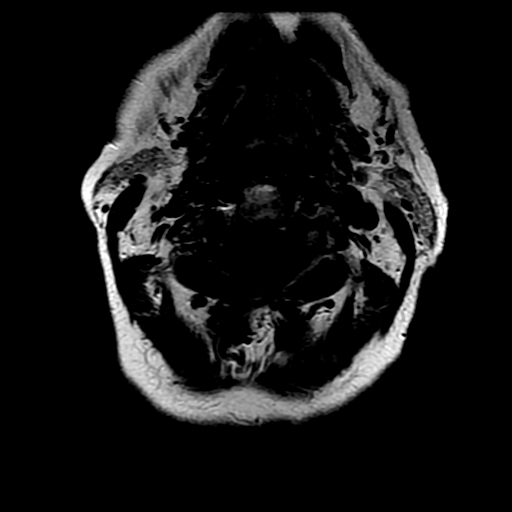

[Series 19: T2 post-contrast · coronal · 5.0mm · 0.39mm/px · 2 of 25 slices shown]
[im 1/25]
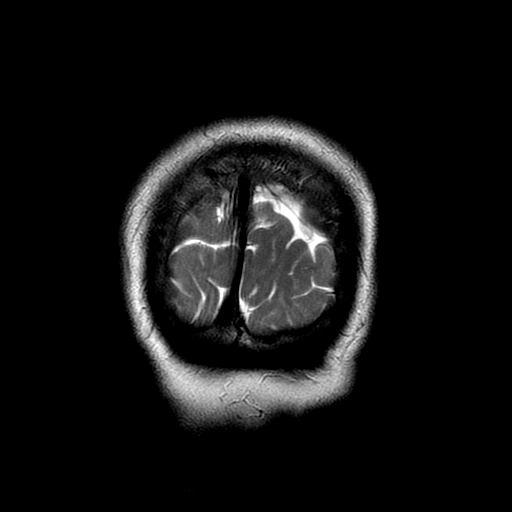
[im 25/25]
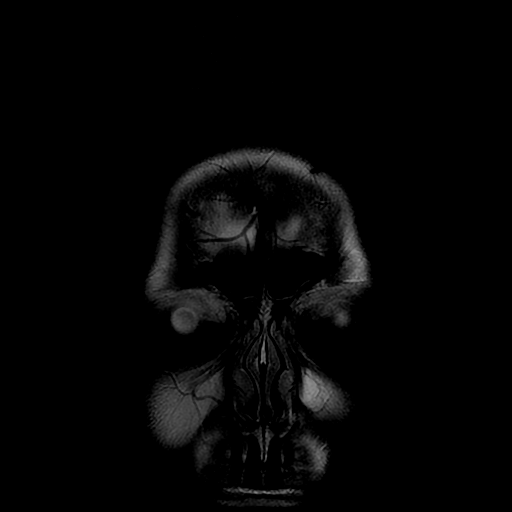

[17 of 48 positions shown; findings below may reference images not displayed]

FINDINGS: Brain findings:

Brain: No evidence of true restricted diffusion to suggest acute
infarct. Mild DWI hyperintensity associated with a right frontal and
high left posterior frontal T2/FLAIR white matter hyperintensities
demonstrate correlate ADC hyperintensity, compatible with T2 shine
through. No hydrocephalus. No mass lesion or abnormal mass effect.
No extra-axial fluid collection. Similar appearance of
periventricular and juxtacortical T2/FLAIR hyperintensities, some of
which appear ovoid or rounded in appearance. Similar wispy
enhancement associated with a prominent rounded right parietal
lesion (series 20, image 30), which also demonstrates T1
hypointensity. Additional faint enhancement associated with a high
left frontal lesion likely was present on the prior.

Vascular: Major arterial flow voids are maintained skull base.

Skull and upper cervical spine: Normal marrow signal.

Sinuses/Orbits: Mild ethmoid air cell mucosal thickening. Otherwise,
clear visualized sinuses. Unremarkable orbits.

Other: No mastoid effusions.

Cervical spine findings:

Alignment: No substantial sagittal subluxation. Straightening of the
normal cervical lordosis.

Vertebrae: Interval C5-C7 ACDF. Vertebral body heights are
maintained. No specific evidence of acute fracture,
discitis/osteomyelitis, or suspicious bone lesion. No evidence of
abnormal enhancement. Metallic artifact from fusion hardware limits
evaluation at these levels.

Cord: Normal cord signal.  No abnormal enhancement.

Vertebral arteries, paraspinal tissues: Visualized vertebral artery
flow voids are maintained. Unremarkable paraspinal soft tissues.

Disc levels:

C2-C3: No significant disc protrusion, foraminal stenosis, or canal
stenosis.

C3-C4: Similar mild posterior disc osteophyte complex and mild
bilateral facet/uncovertebral hypertrophy without significant canal
or foraminal stenosis.

C4-C5: Similar mild posterior disc osteophyte complex and mild
bilateral facet/uncovertebral hypertrophy without significant canal
or foraminal stenosis.

C5-C6: ACDF. Improved canal and foraminal stenosis without evidence
of significant residual canal or foraminal stenosis.

C6-C7: ACDF.Improved canal and foraminal stenosis without evidence
of significant residual canal or foraminal stenosis.

C7-T1: No significant disc protrusion, foraminal stenosis, or canal
stenosis.

No significant canal stenosis in the imaged upper thoracic spine.
IMPRESSION: MRI head:

Similar mild enhancement of a right parietal rounded T2/FLAIR
hyperintense white matter lesion, suspicious for active
demyelination. Additional faint enhancement associated with a high
left frontal white matter lesion likely was present on the prior and
may represent an additional site of active demyelination. No
substantial change in additional T2/FLAIR hyperintense foci.

MRI cervical spine:

1. Interval C5-C7 ACDF without significant canal or foraminal
stenosis.
2. Normal cord signal without enhancement.

## 2021-02-28 MED ORDER — GADOBUTROL 1 MMOL/ML IV SOLN
6.0000 mL | Freq: Once | INTRAVENOUS | Status: AC | PRN
  Administered 2021-02-28: 6 mL via INTRAVENOUS

## 2021-02-28 MED ORDER — LOSARTAN POTASSIUM 50 MG PO TABS
25.0000 mg | ORAL_TABLET | Freq: Once | ORAL | Status: AC
  Administered 2021-02-28: 25 mg via ORAL
  Filled 2021-02-28: qty 1

## 2021-02-28 MED ORDER — SODIUM CHLORIDE 0.9 % IV BOLUS
1000.0000 mL | Freq: Once | INTRAVENOUS | Status: AC
  Administered 2021-02-28: 1000 mL via INTRAVENOUS

## 2021-02-28 MED ORDER — PANTOPRAZOLE SODIUM 40 MG IV SOLR
40.0000 mg | Freq: Once | INTRAVENOUS | Status: AC
  Administered 2021-02-28: 40 mg via INTRAVENOUS
  Filled 2021-02-28: qty 40

## 2021-02-28 NOTE — ED Notes (Signed)
Pt d.c by MD and is provided w/ d/c instructions and follow up care, Pt out of ed in wheelchair with family

## 2021-02-28 NOTE — Telephone Encounter (Signed)
LMOVM and my chart message sent.

## 2021-02-28 NOTE — ED Provider Notes (Signed)
6:03 AM.  Patient not in room on attempted evaluation.   Ezequiel [place], MD 02/28/21 [phone]

## 2021-02-28 NOTE — ED Provider Notes (Signed)
[facility] EMERGENCY DEPARTMENT Provider Note   CSN: [id number] Arrival date & time: 02/28/21  0544     History Chief Complaint  Patient presents with  . Hypertension    Monica Ochoa is a 75 y.o. female.  The history is provided by the patient, the spouse and medical records. No language interpreter was used.  Hypertension      75 year old female significant history of diabetes, hypertension, hyperlipidemia, who presents with multiple complaints.  Patient report she was having trouble with lightheadedness feeling off balance and gait instability at the start of the year.  Symptom is difficult to pinpoint but states that she feels well and lightheadedness with associated nausea.  On 11/18/2020 she developed headache and went to the ER.  At that time she has advanced imaging including MRI of her brain and MRV of the head which shows hyperintense focus to the right parietal white matter but otherwise unremarkable.  She also had follow-up MRI of the cervical spine which shows multilevel spondylosis with moderate canal stenosis at C5-6 and C6-7.  From 14th, patient underwent two-level anterior cervical decompressions and fusion by neurosurgeon Dr. Trenton Gammon.  She report symptoms did seems to improve for about a month but within the past several weeks she now report her lightheadedness and dizziness and nausea has since returned.  Yesterday she felt "bad".  States she felt heat in the back of her neck, severe head pressure, feeling nauseous, and achiness.  She also complaining of drainage coming from her right nostril.  She also endorsed feeling lightheadedness and dizziness.  She check her blood pressure this morning and noticed that it was elevated prompting this ER visit.  She was recently placed on Wellbutrin several days ago.  She denies any recent blood pressure medication changes.  She endorses having no appetite for the past week.  She denies having focal numbness or focal  weakness.  No report of chest pain trouble breathing abdominal pain or low back pain.  No vomiting or diarrhea.  Past Medical History:  Diagnosis Date  . Diabetes mellitus without complication ([place])   . Family history of adverse reaction to anesthesia    Sister has a hard time waking up after anesthesia and post op N/V  . Female bladder prolapse   . Hyperlipidemia 02/14/2016  . Hypertension   . Intermittent palpitations    03-23-2018 per pt caused due to anxiety/ upset  . Osteopenia 06/2019   T score -2.1 FRAX 13% / 3.3% overall stable from prior DEXA  . Palpitations   . PVC's (premature ventricular contractions) 03/17/2016  . Wears glasses     Patient Active Problem List   Diagnosis Date Noted  . Cervical myelopathy ([place]) 12/31/2020  . Female cystocele 11/06/2020  . History of total vaginal hysterectomy (TVH) 11/06/2020  . PVC's (premature ventricular contractions) 03/17/2016  . Atypical chest pain 02/16/2016  . Palpitations 02/14/2016  . Hyperlipidemia 02/14/2016  . Osteopenia 09/15/2013  . Vaginal atrophy 04/20/2012  . Fecal incontinence 04/20/2012    Past Surgical History:  Procedure Laterality Date  . ABDOMINAL HYSTERECTOMY  1977  . ANAL SPHINCTEROPLASTY  02/04/2011   WITH PERINEORRHAPHY  . ANTERIOR CERVICAL DECOMP/DISCECTOMY FUSION N/A 12/31/2020   Procedure: Anterior Cervical Decompression Fusion  - Cervical five-Cervical six - Cervical six-Cervical seven;  Surgeon: Earnie Larsson, MD;  Location: [facility];  Service: Neurosurgery;  Laterality: N/A;  . BREAST EXCISIONAL BIOPSY Left 1989  . LUMBAR SPINE SURGERY  2000  . TOTAL VAGINAL  HYSTERECTOMY  1977   W/  ANTERIOR AND POSTERIOR REPAIR     OB History    Gravida  3   Para  2   Term  2   Preterm      AB  1   Living  2     SAB  1   IAB      Ectopic      Multiple      Live Births  2           Family History  Problem Relation Age of Onset  . Hypertension Mother   . Cervical cancer Mother   . Heart  disease Mother   . Uterine cancer Mother   . Angina Mother   . Diabetes Father   . Other Father        black lung  . Hypertension Brother   . Diabetes Brother   . Hypertension Sister   . Heart Problems Sister        valve problems  . Crohn's disease Sister   . Breast cancer Neg Hx     Social History   Tobacco Use  . Smoking status: Never Smoker  . Smokeless tobacco: Never Used  Vaping Use  . Vaping Use: Never used  Substance Use Topics  . Alcohol use: No    Alcohol/week: 0.0 standard drinks  . Drug use: No    Home Medications Prior to Admission medications   Medication Sig Start Date End Date Taking? Authorizing Provider  atorvastatin (LIPITOR) 10 MG tablet Take 10 mg by mouth every evening.    [provider]  calcium carbonate (OS-CAL) 600 MG TABS Take 600 mg by mouth 2 (two) times daily with a meal.    [provider]  carboxymethylcellulose (REFRESH PLUS) 0.5 % SOLN Place 1 drop into both eyes daily as needed (dry eyes).    [provider]  cholecalciferol (VITAMIN D) 1000 units tablet Take 1,000 Units by mouth daily.    [provider]  Co-Enzyme Q-10 100 MG CAPS Take 100 mg by mouth daily.    [provider]  cyclobenzaprine (FLEXERIL) 10 MG tablet Take 1 tablet (10 mg total) by mouth 3 (three) times daily as needed for muscle spasms. 01/01/21   Bergman, Trinda Pascal D, NP  Flaxseed, Linseed, (FLAXSEED OIL) 1000 MG CAPS Take 1,000 mg by mouth daily.    [provider]  fluticasone (FLONASE) 50 MCG/ACT nasal spray Place 1 spray into both nostrils daily as needed for allergies or rhinitis.    [provider]  HYDROcodone-acetaminophen (NORCO/VICODIN) 5-325 MG tablet Take 1 tablet by mouth every 4 (four) hours as needed for moderate pain. 01/01/21 01/01/22  Viona Gilmore D, NP  losartan (COZAAR) 25 MG tablet Take 25 mg by mouth daily.    [provider]  ondansetron (ZOFRAN) 4 MG tablet Take 1 tablet (4 mg  total) by mouth every 8 (eight) hours as needed for nausea or vomiting. 01/01/21   Viona Gilmore D, NP  predniSONE (DELTASONE) 10 MG tablet 60mg  on day 1, then 50mg  on day 2, then 40mg  on day 3, then 30mg  on day 4, then 20mg  on day 5, then 10mg  on day 6, then STOP 02/25/21   Tomi Likens, Adam R, DO  Probiotic Product (PROBIOTIC COLON SUPPORT PO) Take 1 capsule by mouth every morning.    [provider]    Allergies    Codeine  Review of Systems   Review of Systems  All other  systems reviewed and are negative.   Physical Exam Updated Vital Signs BP (!) 155/72   Pulse 76   Temp 97.8 F (36.6 C) (Oral)   Resp (!) 26   Ht 5\' 3"  (1.6 m)   Wt 65 kg   SpO2 98%   BMI 25.38 kg/m   Physical Exam Vitals and nursing note reviewed.  Constitutional:      General: She is not in acute distress.    Appearance: She is well-developed.  HENT:     Head: Atraumatic.  Eyes:     Conjunctiva/sclera: Conjunctivae normal.  Cardiovascular:     Rate and Rhythm: Normal rate and regular rhythm.     Pulses: Normal pulses.     Heart sounds: Normal heart sounds.  Pulmonary:     Effort: Pulmonary effort is normal.     Breath sounds: Normal breath sounds.  Musculoskeletal:     Cervical back: Neck supple.  Skin:    Findings: No rash.  Neurological:     Mental Status: She is alert and oriented to person, place, and time.     GCS: GCS eye subscore is 4. GCS verbal subscore is 5. GCS motor subscore is 6.     Cranial Nerves: Cranial nerves are intact.     Sensory: Sensation is intact.     Motor: Motor function is intact. No weakness.     Coordination: Coordination is intact.  Psychiatric:        Mood and Affect: Mood normal.     ED Results / Procedures / Treatments   Labs (all labs ordered are listed, but only abnormal results are displayed) Labs Reviewed  BASIC METABOLIC PANEL - Abnormal; Notable for the following components:      Result Value   Sodium 133 (*)    Potassium 3.3 (*)     Glucose, Bld 126 (*)    All other components within normal limits  CBC WITH DIFFERENTIAL/PLATELET  MAGNESIUM  PHOSPHORUS  TSH  TROPONIN I (HIGH SENSITIVITY)  TROPONIN I (HIGH SENSITIVITY)    EKG EKG Interpretation  Date/Time:  Thursday February 28 2021 06:21:56 EDT Ventricular Rate:  80 PR Interval:  166 QRS Duration: 76 QT Interval:  398 QTC Calculation: 460 R Axis:   45 Text Interpretation: Sinus rhythm Low voltage, precordial leads No significant change was found Confirmed by Ezequiel Essex [phone]) on 02/28/2021 6:37:42 AM   Radiology MR Brain W and Wo Contrast  Result Date: 02/28/2021 CLINICAL DATA:  Dizziness.  Headache.  Tingling in arms. EXAM: MRI HEAD WITHOUT AND WITH CONTRAST MRI CERVICAL SPINE WITHOUT AND WITH CONTRAST TECHNIQUE: Multiplanar, multiecho pulse sequences of the brain and surrounding structures were obtained without and with intravenous contrast. Multiplanar and multiecho pulse sequences of the cervical spine, to include the craniocervical junction and cervicothoracic junction, were obtained without and with intravenous contrast. CONTRAST:  71mL GADAVIST GADOBUTROL 1 MMOL/ML IV SOLN COMPARISON:  MRI head 11/19/2020. MRI cervical spine 11/21/2020. FINDINGS: Brain findings: Brain: No evidence of true restricted diffusion to suggest acute infarct. Mild DWI hyperintensity associated with a right frontal and high left posterior frontal T2/FLAIR white matter hyperintensities demonstrate correlate ADC hyperintensity, compatible with T2 shine through. No hydrocephalus. No mass lesion or abnormal mass effect. No extra-axial fluid collection. Similar appearance of periventricular and juxtacortical T2/FLAIR hyperintensities, some of which appear ovoid or rounded in appearance. Similar wispy enhancement associated with a prominent rounded right parietal lesion (series 20, image 30), which also demonstrates T1 hypointensity. Additional faint enhancement associated  with a high left  frontal lesion likely was present on the prior. Vascular: Major arterial flow voids are maintained skull base. Skull and upper cervical spine: Normal marrow signal. Sinuses/Orbits: Mild ethmoid air cell mucosal thickening. Otherwise, clear visualized sinuses. Unremarkable orbits. Other: No mastoid effusions. Cervical spine findings: Alignment: No substantial sagittal subluxation. Straightening of the normal cervical lordosis. Vertebrae: Interval C5-C7 ACDF. Vertebral body heights are maintained. No specific evidence of acute fracture, discitis/osteomyelitis, or suspicious bone lesion. No evidence of abnormal enhancement. Metallic artifact from fusion hardware limits evaluation at these levels. Cord: Normal cord signal.  No abnormal enhancement. Vertebral arteries, paraspinal tissues: Visualized vertebral artery flow voids are maintained. Unremarkable paraspinal soft tissues. Disc levels: C2-C3: No significant disc protrusion, foraminal stenosis, or canal stenosis. C3-C4: Similar mild posterior disc osteophyte complex and mild bilateral facet/uncovertebral hypertrophy without significant canal or foraminal stenosis. C4-C5: Similar mild posterior disc osteophyte complex and mild bilateral facet/uncovertebral hypertrophy without significant canal or foraminal stenosis. C5-C6: ACDF. Improved canal and foraminal stenosis without evidence of significant residual canal or foraminal stenosis. C6-C7: ACDF.Improved canal and foraminal stenosis without evidence of significant residual canal or foraminal stenosis. C7-T1: No significant disc protrusion, foraminal stenosis, or canal stenosis. No significant canal stenosis in the imaged upper thoracic spine. IMPRESSION: MRI head: Similar mild enhancement of a right parietal rounded T2/FLAIR hyperintense white matter lesion, suspicious for active demyelination. Additional faint enhancement associated with a high left frontal white matter lesion likely was present on the prior and  may represent an additional site of active demyelination. No substantial change in additional T2/FLAIR hyperintense foci. MRI cervical spine: 1. Interval C5-C7 ACDF without significant canal or foraminal stenosis. 2. Normal cord signal without enhancement. Electronically Signed   By: Margaretha Sheffield MD   On: 02/28/2021 10:23   MR Cervical Spine W or Wo Contrast  Result Date: 02/28/2021 CLINICAL DATA:  Dizziness.  Headache.  Tingling in arms. EXAM: MRI HEAD WITHOUT AND WITH CONTRAST MRI CERVICAL SPINE WITHOUT AND WITH CONTRAST TECHNIQUE: Multiplanar, multiecho pulse sequences of the brain and surrounding structures were obtained without and with intravenous contrast. Multiplanar and multiecho pulse sequences of the cervical spine, to include the craniocervical junction and cervicothoracic junction, were obtained without and with intravenous contrast. CONTRAST:  103mL GADAVIST GADOBUTROL 1 MMOL/ML IV SOLN COMPARISON:  MRI head 11/19/2020. MRI cervical spine 11/21/2020. FINDINGS: Brain findings: Brain: No evidence of true restricted diffusion to suggest acute infarct. Mild DWI hyperintensity associated with a right frontal and high left posterior frontal T2/FLAIR white matter hyperintensities demonstrate correlate ADC hyperintensity, compatible with T2 shine through. No hydrocephalus. No mass lesion or abnormal mass effect. No extra-axial fluid collection. Similar appearance of periventricular and juxtacortical T2/FLAIR hyperintensities, some of which appear ovoid or rounded in appearance. Similar wispy enhancement associated with a prominent rounded right parietal lesion (series 20, image 30), which also demonstrates T1 hypointensity. Additional faint enhancement associated with a high left frontal lesion likely was present on the prior. Vascular: Major arterial flow voids are maintained skull base. Skull and upper cervical spine: Normal marrow signal. Sinuses/Orbits: Mild ethmoid air cell mucosal thickening.  Otherwise, clear visualized sinuses. Unremarkable orbits. Other: No mastoid effusions. Cervical spine findings: Alignment: No substantial sagittal subluxation. Straightening of the normal cervical lordosis. Vertebrae: Interval C5-C7 ACDF. Vertebral body heights are maintained. No specific evidence of acute fracture, discitis/osteomyelitis, or suspicious bone lesion. No evidence of abnormal enhancement. Metallic artifact from fusion hardware limits evaluation at these levels. Cord: Normal cord signal.  No  abnormal enhancement. Vertebral arteries, paraspinal tissues: Visualized vertebral artery flow voids are maintained. Unremarkable paraspinal soft tissues. Disc levels: C2-C3: No significant disc protrusion, foraminal stenosis, or canal stenosis. C3-C4: Similar mild posterior disc osteophyte complex and mild bilateral facet/uncovertebral hypertrophy without significant canal or foraminal stenosis. C4-C5: Similar mild posterior disc osteophyte complex and mild bilateral facet/uncovertebral hypertrophy without significant canal or foraminal stenosis. C5-C6: ACDF. Improved canal and foraminal stenosis without evidence of significant residual canal or foraminal stenosis. C6-C7: ACDF.Improved canal and foraminal stenosis without evidence of significant residual canal or foraminal stenosis. C7-T1: No significant disc protrusion, foraminal stenosis, or canal stenosis. No significant canal stenosis in the imaged upper thoracic spine. IMPRESSION: MRI head: Similar mild enhancement of a right parietal rounded T2/FLAIR hyperintense white matter lesion, suspicious for active demyelination. Additional faint enhancement associated with a high left frontal white matter lesion likely was present on the prior and may represent an additional site of active demyelination. No substantial change in additional T2/FLAIR hyperintense foci. MRI cervical spine: 1. Interval C5-C7 ACDF without significant canal or foraminal stenosis. 2. Normal  cord signal without enhancement. Electronically Signed   By: Margaretha Sheffield MD   On: 02/28/2021 10:23    Procedures Procedures   Medications Ordered in ED Medications  losartan (COZAAR) tablet 25 mg (has no administration in time range)  pantoprazole (PROTONIX) injection 40 mg (40 mg Intravenous Given 02/28/21 1007)  sodium chloride 0.9 % bolus 1,000 mL (1,000 mLs Intravenous New Bag/Given 02/28/21 1005)  gadobutrol (GADAVIST) 1 MMOL/ML injection 6 mL (6 mLs Intravenous Contrast Given 02/28/21 0930)    ED Course  I have reviewed the triage vital signs and the nursing notes.  Pertinent labs & imaging results that were available during my care of the patient were reviewed by me and considered in my medical decision making (see chart for details).    MDM Rules/Calculators/A&P                          BP (!) 165/56   Pulse 74   Temp 97.8 F (36.6 C) (Oral)   Resp (!) 30   Ht 5\' 3"  (1.6 m)   Wt 65 kg   SpO2 98%   BMI 25.38 kg/m   Final Clinical Impression(s) / ED Diagnoses Final diagnoses:  Orthostatic dizziness  Abnormal finding on MRI of brain    Rx / DC Orders ED Discharge Orders    None     Patient here complaining of ataxia, dizziness, lightheadedness, head pressure, burning sensation behind her neck which felt similar to symptoms she has previously several months ago which requires cervical spinal fusion by Dr. Trenton Gammon.  She is also voiced concerns with elevated blood pressure.  I did discuss care with Dr. Vallery Ridge, who has evaluated patient and our plan is to obtain MRI of the brain and cervical spine as well as checking labs.  She also endorsed having nausea, indigestion, increased belching.  She would likely benefit from Prilosec.  We will give Protonix here.  10:40 AM Labs are mostly normal.  Normal magnesium, phosphorus, TSH, troponin.  MRI of the brain and the cervical spine demonstrates similar mild enhancement of the right parietal hyperintense white matter  lesions suspicious for active demyelination with additional faint enhancement associate with high left frontal white matter lesion likely represents demyelination.  Patient does have mild clonus to her lower extremities.  She does have some cogwheel rigidity of her upper extremities.  Will consult  neurology for recommendation  3:01 PM I seen and evaluated patient.  At this time he recommend outpatient follow-up and evaluation for her abnormal brain MRI but does not think she would need emergent treatment.  We felt her symptoms more likely to be orthostatic hypotension causing her lightheadedness.  On orthostatics, patient does have a drop in her systolic blood pressure consistence with orthostatic hypotension.Marland Kitchen  Heart rate patient did receive IV fluid here.  At this time she is stable for discharge and agrees to follow-up outpatient.  Return precaution given.   Domenic Moras, PA-C 02/28/21 [place]    Charlesetta Shanks, MD 03/01/21 [phone]

## 2021-02-28 NOTE — ED Triage Notes (Signed)
Patient reports elevated blood pressure BP= 182/88 this morning with head pressure and nausea .

## 2021-02-28 NOTE — Discharge Instructions (Addendum)
You have symptoms may be due to orthostatic dizziness.  Please stay hydrated.  Take your medication as prescribed.  Call and follow-up closely with your neurologist for further managements of the abnormal finding on the brain MRI.  Return to ER if you have any concern.

## 2021-02-28 NOTE — Telephone Encounter (Signed)
Yes, we can provide a letter

## 2021-02-28 NOTE — Consult Note (Signed)
Neurology Consultation  Reason for Consult: Lightheaded / imbalanced patient with concern for hyperintense white matter lesion on MRI brain Referring Physician: Dr. Johnney Killian  CC: Lightheadedness / incoordination with ambulation  History is obtained from: Chart review, Patient  HPI: Monica Ochoa is a 75 y.o. female with a medical history significant for type 2 diabetes mellitus, bladder prolapse, hypertension, hyperlipidemia, osteopenia, and cervical stenosis s/p ACDF in February 2022 who presents to the ED for evaluation of lightheadedness, nausea, hypertension, as well as "shaking" legs with ambulation leading to feeling imbalanced. [name] states that before her cervical spine surgery in February she was ataxic, fatigued, and lightheaded and she noticed her signature while writing became more sloppy and these symptoms improved following her ACDF until the last 2 weeks. She has since felt nauseated, fatigued, lightheaded, and unstable during ambulation with shaking legs as if they are not able to support her. She also notes that her handwriting has also become more sloppy like prior to her cervical spine surgery. Her husband notes that she was getting stronger and able to take short walks independently but due to the lightheadedness, she became more unsteady on her feet. She states that last night she had multiple episodes of feeling "bad" where her head felt hot and full of pressure. These episodes occurred at midnight, 02:00, and again at 05:30. At this time her blood pressures were significantly elevated beyond her normal pressure into the 180s. She denies blurry or double vision but does endorse persistent photophobia with flickering and bright lights since prior to her cervical spine surgery. She denies any changes in bowel or bladder functioning or sensory changes in her extremities.   ROS: A complete ROS was performed and is negative except as noted in the HPI.  Past Medical History:   Diagnosis Date  . Diabetes mellitus without complication ([place])   . Family history of adverse reaction to anesthesia    Sister has a hard time waking up after anesthesia and post op N/V  . Female bladder prolapse   . Hyperlipidemia 02/14/2016  . Hypertension   . Intermittent palpitations    03-23-2018 per pt caused due to anxiety/ upset  . Osteopenia 06/2019   T score -2.1 FRAX 13% / 3.3% overall stable from prior DEXA  . Palpitations   . PVC's (premature ventricular contractions) 03/17/2016  . Wears glasses    Past Surgical History:  Procedure Laterality Date  . ABDOMINAL HYSTERECTOMY  1977  . ANAL SPHINCTEROPLASTY  02/04/2011   WITH PERINEORRHAPHY  . ANTERIOR CERVICAL DECOMP/DISCECTOMY FUSION N/A 12/31/2020   Procedure: Anterior Cervical Decompression Fusion  - Cervical five-Cervical six - Cervical six-Cervical seven;  Surgeon: Earnie Larsson, MD;  Location: [facility];  Service: Neurosurgery;  Laterality: N/A;  . BREAST EXCISIONAL BIOPSY Left 1989  . LUMBAR SPINE SURGERY  2000  . TOTAL VAGINAL HYSTERECTOMY  1977   W/  ANTERIOR AND POSTERIOR REPAIR   Family History  Problem Relation Age of Onset  . Hypertension Mother   . Cervical cancer Mother   . Heart disease Mother   . Uterine cancer Mother   . Angina Mother   . Diabetes Father   . Other Father        black lung  . Hypertension Brother   . Diabetes Brother   . Hypertension Sister   . Heart Problems Sister        valve problems  . Crohn's disease Sister   . Breast cancer Neg Hx  Social History:   reports that she has never smoked. She has never used smokeless tobacco. She reports that she does not drink alcohol and does not use drugs.  Medications No current facility-administered medications for this encounter.  Current Outpatient Medications:  .  ondansetron (ZOFRAN) 4 MG tablet, Take 1 tablet (4 mg total) by mouth every 8 (eight) hours as needed for nausea or vomiting., Disp: 15 tablet, Rfl: 0 .  Probiotic Product  (PROBIOTIC COLON SUPPORT PO), Take 1 capsule by mouth every morning., Disp: , Rfl:  .  atorvastatin (LIPITOR) 10 MG tablet, Take 10 mg by mouth every evening., Disp: , Rfl:  .  calcium carbonate (OS-CAL) 600 MG TABS, Take 600 mg by mouth 2 (two) times daily with a meal., Disp: , Rfl:  .  carboxymethylcellulose (REFRESH PLUS) 0.5 % SOLN, Place 1 drop into both eyes daily as needed (dry eyes)., Disp: , Rfl:  .  cholecalciferol (VITAMIN D) 1000 units tablet, Take 1,000 Units by mouth daily., Disp: , Rfl:  .  Co-Enzyme Q-10 100 MG CAPS, Take 100 mg by mouth daily., Disp: , Rfl:  .  cyclobenzaprine (FLEXERIL) 10 MG tablet, Take 1 tablet (10 mg total) by mouth 3 (three) times daily as needed for muscle spasms., Disp: 30 tablet, Rfl: 0 .  Flaxseed, Linseed, (FLAXSEED OIL) 1000 MG CAPS, Take 1,000 mg by mouth daily., Disp: , Rfl:  .  fluticasone (FLONASE) 50 MCG/ACT nasal spray, Place 1 spray into both nostrils daily as needed for allergies or rhinitis., Disp: , Rfl:  .  HYDROcodone-acetaminophen (NORCO/VICODIN) 5-325 MG tablet, Take 1 tablet by mouth every 4 (four) hours as needed for moderate pain. (Patient not taking: Reported on 02/28/2021), Disp: 30 tablet, Rfl: 0 .  losartan (COZAAR) 25 MG tablet, Take 25 mg by mouth daily., Disp: , Rfl:  .  predniSONE (DELTASONE) 10 MG tablet, 60mg  on day 1, then 50mg  on day 2, then 40mg  on day 3, then 30mg  on day 4, then 20mg  on day 5, then 10mg  on day 6, then STOP, Disp: 21 tablet, Rfl: 0  Exam: Current vital signs: BP (!) 163/68   Pulse 73   Temp 97.8 F (36.6 C) (Oral)   Resp (!) 23   Ht 5\' 3"  (1.6 m)   Wt 65 kg   SpO2 98%   BMI 25.38 kg/m  Vital signs in last 24 hours: Temp:  [97.8 F (36.6 C)] 97.8 F (36.6 C) (04/14 0550) Pulse Rate:  [68-99] 73 (04/14 1130) Resp:  [15-26] 23 (04/14 1130) BP: (145-193)/(64-87) 163/68 (04/14 1130) SpO2:  [97 %-98 %] 98 % (04/14 1130) Weight:  [65 kg] 65 kg (04/14 0549)  GENERAL: Awake, alert, sitting up in ER  stretcher in no acute distress Head: Normocephalic and atraumatic, dry mm EENT: Eyeglasses in place, normal conjunctivae, no OP obstruction LUNGS - Normal respiratory effort. Non-labored breathing CV: Regular rate on telemetry, extremities warm without edema ABDOMEN: Soft, nontender Ext: warm, well perfused, without obvious deformity  NEURO:  Mental Status: Alert and oriented to person, place, age, year, month, and situation. Patient is able to give a clear and coherent history of present illness. Speech is intact without dysarthria or aphasia.  Naming, repetition, fluency, and comprehension intact. No neglect is noted. Cranial Nerves:  II: left pupil slightly larger than left; likely physiologic anisorcia 4 mm/brisk. Visual fields full.  III, IV, VI: EOMI. Lid elevation symmetric and full.  V: Sensation is intact to light touch and symmetrical to face.  VII: Face is symmetric resting and smiling. VIII: Hearing intact to voice IX, X: Palate elevation is symmetric. Phonation normal.  XI: Normal sternocleidomastoid and trapezius muscle strength XII: Tongue protrudes midline without fasciculations.   Motor: 5/5 strength is all muscle groups. Right shoulder with limited ROM at baseline since c-spine surgery in February. No tremor noted. Tone is mildly increased in bilateral lower extremities with spasticity.  Bulk is normal.  Sensation: Intact to light touch bilaterally in all four extremities. No extinction to DSS present.  Coordination: FTN intact bilaterally. No pronator drift.  DTRs: 3+ and symmetric patellae and biceps  Gait- deferred Plantars: upgoing  Labs I have reviewed labs in epic and the results pertinent to this consultation are: CBC    Component Value Date/Time   WBC 6.7 02/28/2021 0602   RBC 4.77 02/28/2021 0602   HGB 13.6 02/28/2021 0602   HCT 40.5 02/28/2021 0602   PLT 265 02/28/2021 0602   MCV 84.9 02/28/2021 0602   MCH 28.5 02/28/2021 0602   MCHC 33.6  02/28/2021 0602   RDW 12.3 02/28/2021 0602   LYMPHSABS 2.0 02/28/2021 0602   MONOABS 0.5 02/28/2021 0602   EOSABS 0.1 02/28/2021 0602   BASOSABS 0.1 02/28/2021 0602  CMP     Component Value Date/Time   NA 133 (L) 02/28/2021 0602   K 3.3 (L) 02/28/2021 0602   CL 99 02/28/2021 0602   CO2 24 02/28/2021 0602   GLUCOSE 126 (H) 02/28/2021 0602   BUN 9 02/28/2021 0602   CREATININE 0.78 02/28/2021 0602   CREATININE 0.84 02/14/2016 0845   CALCIUM 9.4 02/28/2021 0602   PROT 7.8 11/18/2020 1817   ALBUMIN 4.7 11/18/2020 1817   AST 29 11/18/2020 1817   ALT 25 11/18/2020 1817   ALKPHOS 72 11/18/2020 1817   BILITOT 0.4 11/18/2020 1817   GFRNONAA >60 02/28/2021 0602   GFRAA >60 03/23/2018 1332    Imaging I have reviewed the images obtained: MRI head: Similar mild enhancement of a right parietal rounded T2/FLAIR hyperintense white matter lesion, suspicious for active demyelination. Additional faint enhancement associated with a high left frontal white matter lesion likely was present on the prior and may represent an additional site of active demyelination. No substantial change in additional T2/FLAIR hyperintense foci.  MRI cervical spine: 1. Interval C5-C7 ACDF without significant canal or foraminal stenosis. 2. Normal cord signal without enhancement.  Assessment: 75 year old female with history as above who presents for evaluation of shaking legs with ambulation / incoordinated gait, nausea, and lightheadedness with hypertension at home. These symptoms are similar to symptoms she experienced prior to cervical spine decompression in February 2022.  - MRI in January with concern for T2/FLAIR hyperintense white matter lesion in the left frontal area. Current concern for demyelinating lesion in the right parietal region with mild enhancement on MRI imaging.  - Examination without symptoms typical for demyelinating process. Examination reveals patient with spacticity of lower extremities and  hyperactive reflexes without sensory changes. Lightheadedness improves with lying down.  - Patient with concerns symptoms are c/w cervical stenosis prior to surgery, MRI c-spine without significant canal or foraminal stenosis and with normal cord signal without enhancement.  - Symptoms thought to be most consistent with orthostasis as patient states her leg unsteadiness is fairly consistent from post-operative function.  - Cannot rule out demyelinating process with MRI evidence of enhancement of hyperintense white matter lesions in left frontal and right parietal regions but felt unlikely that the current presentation is due to demyelination.  Recommendations: - Orthostatic vital signs with additional standing vitals at 5 minutes - Education on slow position changes, sitting up at bedside for approximately 5 minutes before standing - Would recommend outpatient follow up with neurologist for evaluation of white matter lesions identified on MRI; will eventually need thoracic spine imaging    Anibal Henderson, AGAC-NP [facility] Pager: [phone]

## 2021-02-28 NOTE — ED Provider Notes (Signed)
I provided a substantive portion of the care of this patient.  I personally performed the entirety of the history for this encounter.  EKG Interpretation  Date/Time:  Thursday February 28 2021 06:21:56 EDT Ventricular Rate:  80 PR Interval:  166 QRS Duration: 76 QT Interval:  398 QTC Calculation: 460 R Axis:   45 Text Interpretation: Sinus rhythm Low voltage, precordial leads No significant change was found Confirmed by Ezequiel Essex [phone]) on 02/28/2021 6:37:42 AM  Patient reports she has been getting lightheaded and feeling off balance.  This has been episodic.  This was a problem that existed before she had a cervical spine decompression for stenosis.  Symptoms were temporarily improved.  She however had recovery with pain but then for.  It seemed like there was improvement and her activity level improved.  She reports however over the past several weeks she is now feeling like she did before the decompression surgery.  She again experiences lightheadedness and a sensation of being off balance.  She however does not have weakness in the legs.  It is more of a feeling of incoordination with walking.  She also does not have any problems with the upper extremities.  She reports she can do her normal household tasks she would typically do without dropping things or being incoordinated.  She became very concerned yesterday evening.  She experienced a sensation of intense pressure in her head that was not painful but disconcerting.  Along with that she felt that her head was particularly hot especially on the right side but to touch it was normal.  At that same time her blood pressure had spiked considerably.  Patient is alert and nontoxic.  Mental status clear.  Cranial nerves II through XII intact.  No focal motor deficit.  Patient does have cogwheeling bilateral upper extremities.  Slight intention tremor with finger-nose exam.  Patient has several beats of clonus to forced, brisk plantar flexion.   Heart is regular no rub murmur gallop.  Lungs are clear.  Abdomen is soft and nondistended.  There is a broad differential for patient's symptoms.  We will proceed with diagnostic lab work including magnesium phosphorus and TSH.  We will also proceed with MRI with and without contrast.  This was ordered by Dr. Tomi Likens for monitoring July 2022.  We will proceed with this test at this time for additional diagnostic evaluation of neurologic symptoms.  We will also obtain MRI of the C-spine to evaluate for any cord compression that may have developed postoperatively.  I agree with plan and management.  Clinically, patient is well in appearance.  She does not have respiratory distress blood pressures are moderately elevated but no evidence of hypertensive emergency.    Charlesetta Shanks, MD 02/28/21 1020

## 2021-02-28 NOTE — ED Notes (Signed)
Pt transported to MRI 

## 2021-03-14 ENCOUNTER — Telehealth: Payer: Self-pay

## 2021-03-14 ENCOUNTER — Other Ambulatory Visit: Payer: Self-pay

## 2021-03-14 ENCOUNTER — Ambulatory Visit: Payer: Medicare Other | Admitting: Neurology

## 2021-03-14 ENCOUNTER — Encounter: Payer: Self-pay | Admitting: Neurology

## 2021-03-14 VITALS — BP 143/87 | HR 96 | Ht 63.0 in | Wt 132.4 lb

## 2021-03-14 DIAGNOSIS — R42 Dizziness and giddiness: Secondary | ICD-10-CM

## 2021-03-14 DIAGNOSIS — R9082 White matter disease, unspecified: Secondary | ICD-10-CM | POA: Diagnosis not present

## 2021-03-14 DIAGNOSIS — G379 Demyelinating disease of central nervous system, unspecified: Secondary | ICD-10-CM

## 2021-03-14 NOTE — Patient Instructions (Addendum)
1.  Will contact you about when to set up for a spinal tap 2.  If you would like to start a medication for the burning in neck, contact me

## 2021-03-14 NOTE — Telephone Encounter (Signed)
Telephone call to pt, Pt wanted to give [place] call back later with the decision.

## 2021-03-14 NOTE — Progress Notes (Signed)
NEUROLOGY FOLLOW UP OFFICE NOTE  Monica Ochoa [id number]  Assessment/Plan:   1.  White matter abnormality of the brain - pattern overall nonspecific but evidence of faint enhancement concerning for active demyelination.  I am not certain that this would explain her symptoms given location. 2.  Dizziness  1.  For further evaluation of demyelinating disease, I recommended lumbar puncture.  She will get back to me after she decides whether to proceed.  Subjective:  Monica Violette. Ochoa is a 75 year old right-handed female with type 2 diabetes mellitus, HTN, HLD and intermittent palpitations who follows up for dizziness and white matter changes on brain MRI.  She is accompanied by her husband who supplements history.  UPDATE: She underwent C5-7 ACDF on 12/31/2020.  Dizziness resolved.  However, the dizziness returned 4/1, worse than before the surgery.  It seemed to have been triggered by exposure to bright light.  Describes lightheadedness, occipital head pressure, burning behind her neck, trouble reading or scrolling with her eyes and unsteady gait as well as tingling in the arms and legs.  She went to the ED on 4/14. She was found to be orthostatic.  MRI of brain with and without contrast personally reviewed again showed periventricular and juxtacortical T2/FLAIR hyperintensities and mild enhancing hyperintense foci of the right parietal and left frontal white matter, stable compared to prior imaging from January.  MRI of cervical spine with and without contrast personally reviewed showed interval C5-C7 ACDF without significant stenosis but no abnormal cord.  She finds it difficult to concentrate.  She says her taste comes and goes.  She continues to be very sensitive to light.  She is also experiencing gastrointestinal symptoms such as upset stomach or nausea and burping after meals.  She reports extreme fatigue.  HISTORY: She began experiencing dizziness since 09/17/2020.  It is vague and  difficult to describe but it is not a spinning sensation.  It is positional, aggravated with head movement to either side or bending over.  It lasts just a few seconds.  She has associated nausea.  No headache, ear pain, numbness, weakness, visual disturbance.  Formal eye exam was normal except for dry eyes.  She saw ENT.  CT sinuses were negative.  On 11/18/2020, she had developed new onset severe non-throbbing frontal headache.  She went to the ED where she had an MRI of the brain without contrast and MRV of head with and without contrast which showed a T2/FLAIR hyperintense focus in the right parietal white matter with associated mild contrast enhancement, but otherwise unremarkable.  Follow up MRI of cervical spine with and without contrast on 11/21/2020 showed multilevel spondylosis with moderate canal stenosis at C5-6 and C6-7 as well as severe right and moderate left neural foraminal stenosis at C5-6 and moderate right and mild left neural foraminal stenosis at C6-7 but no cord abnormalities.  She denies prior history of migraines or other focal neurologic symptoms.  No recurrence of those headaches.  PAST MEDICAL HISTORY: Past Medical History:  Diagnosis Date  . Diabetes mellitus without complication ([place])   . Family history of adverse reaction to anesthesia    Sister has a hard time waking up after anesthesia and post op N/V  . Female bladder prolapse   . Hyperlipidemia 02/14/2016  . Hypertension   . Intermittent palpitations    03-23-2018 per pt caused due to anxiety/ upset  . Osteopenia 06/2019   T score -2.1 FRAX 13% / 3.3% overall stable from prior DEXA  .  Palpitations   . PVC's (premature ventricular contractions) 03/17/2016  . Wears glasses     MEDICATIONS: Current Outpatient Medications on File Prior to Visit  Medication Sig Dispense Refill  . atorvastatin (LIPITOR) 10 MG tablet Take 10 mg by mouth every evening.    . calcium carbonate (OS-CAL) 600 MG TABS Take 600 mg by mouth 2  (two) times daily with a meal.    . carboxymethylcellulose (REFRESH PLUS) 0.5 % SOLN Place 1 drop into both eyes daily as needed (dry eyes).    . cholecalciferol (VITAMIN D) 1000 units tablet Take 1,000 Units by mouth daily.    Marland Kitchen Co-Enzyme Q-10 100 MG CAPS Take 100 mg by mouth daily.    . cyclobenzaprine (FLEXERIL) 10 MG tablet Take 1 tablet (10 mg total) by mouth 3 (three) times daily as needed for muscle spasms. 30 tablet 0  . Flaxseed, Linseed, (FLAXSEED OIL) 1000 MG CAPS Take 1,000 mg by mouth daily.    . fluticasone (FLONASE) 50 MCG/ACT nasal spray Place 1 spray into both nostrils daily as needed for allergies or rhinitis.    Marland Kitchen HYDROcodone-acetaminophen (NORCO/VICODIN) 5-325 MG tablet Take 1 tablet by mouth every 4 (four) hours as needed for moderate pain. (Patient not taking: Reported on 02/28/2021) 30 tablet 0  . losartan (COZAAR) 25 MG tablet Take 25 mg by mouth daily.    . ondansetron (ZOFRAN) 4 MG tablet Take 1 tablet (4 mg total) by mouth every 8 (eight) hours as needed for nausea or vomiting. 15 tablet 0  . predniSONE (DELTASONE) 10 MG tablet 60mg  on day 1, then 50mg  on day 2, then 40mg  on day 3, then 30mg  on day 4, then 20mg  on day 5, then 10mg  on day 6, then STOP 21 tablet 0  . Probiotic Product (PROBIOTIC COLON SUPPORT PO) Take 1 capsule by mouth every morning.     No current facility-administered medications on file prior to visit.    ALLERGIES: Allergies  Allergen Reactions  . Codeine Palpitations    "makes heart palpitate"    FAMILY HISTORY: Family History  Problem Relation Age of Onset  . Hypertension Mother   . Cervical cancer Mother   . Heart disease Mother   . Uterine cancer Mother   . Angina Mother   . Diabetes Father   . Other Father        black lung  . Hypertension Brother   . Diabetes Brother   . Hypertension Sister   . Heart Problems Sister        valve problems  . Crohn's disease Sister   . Breast cancer Neg Hx       Objective:  Blood pressure  (!) 143/87, pulse 96, height 5\' 3"  (1.6 m), weight 132 lb 6.4 oz (60.1 kg), SpO2 99 %. General: No acute distress.  Patient appears well-groomed.   Head:  Normocephalic/atraumatic Eyes:  Fundi examined but not visualized Neck: supple, no paraspinal tenderness, full range of motion Heart:  Regular rate and rhythm Lungs:  Clear to auscultation bilaterally Back: No paraspinal tenderness Neurological Exam: alert and oriented to person, place, and time. Speech fluent and not dysarthric, language intact.  Endorses reduced left V2 sensation.  Otherwise, CN II-XII intact. Bulk and tone normal, muscle strength 5/5 throughout.  Mild postural tremor in hands.  Sensation to pinprick reduced in left hand and leg; vibration intact.  Deep tendon reflexes 2+ throughout, toes downgoing.  Finger to nose and heel to shin testing intact.  Gait wide-based.  Unable to tandem walk.  Romberg negative.     Metta Clines, DO  CC: Caren Macadam, MD

## 2021-03-14 NOTE — Telephone Encounter (Signed)
-----   Message from Pieter Partridge, DO sent at 03/14/2021  3:54 PM EDT ----- Please contact patient to let them know that we can proceed with spinal tap.  Is she agreeable?

## 2021-03-15 NOTE — Telephone Encounter (Signed)
Order for Spinal tap ordered. Faxed order form to [facility].

## 2021-03-15 NOTE — Telephone Encounter (Signed)
Pt agrees to having the Spinal tap.   Please advise of labs to add to it.

## 2021-03-15 NOTE — Telephone Encounter (Signed)
CSF cell count, protein, glucose, gram stain and culture, oligoclonal bands, IgG index, cytology, flow cytometry

## 2021-03-18 ENCOUNTER — Ambulatory Visit
Admission: RE | Admit: 2021-03-18 | Discharge: 2021-03-18 | Disposition: A | Discharge status: Home / Self Care | Payer: Medicare Other | Source: Ambulatory Visit | Attending: Neurology | Admitting: Neurology

## 2021-03-18 ENCOUNTER — Other Ambulatory Visit: Payer: Self-pay

## 2021-03-18 ENCOUNTER — Other Ambulatory Visit: Payer: Self-pay | Admitting: Neurology

## 2021-03-18 ENCOUNTER — Other Ambulatory Visit (HOSPITAL_COMMUNITY)
Admission: RE | Admit: 2021-03-18 | Discharge: 2021-03-18 | Disposition: A | Discharge status: Home / Self Care | Payer: Medicare Other | Source: Ambulatory Visit | Attending: Neurology | Admitting: Neurology

## 2021-03-18 VITALS — BP 162/74 | HR 75

## 2021-03-18 DIAGNOSIS — R9082 White matter disease, unspecified: Secondary | ICD-10-CM

## 2021-03-18 DIAGNOSIS — G379 Demyelinating disease of central nervous system, unspecified: Secondary | ICD-10-CM

## 2021-03-18 IMAGING — XA DG SPINAL PUNCT LUMBAR DIAG WITH FL CT GUIDANCE
1 series · 1 of 1 positions shown · non-contrast
Comparison: MR head [DATE]

CLINICAL DATA: Demyelinating disease. Abnormal MRI of the brain.
Dizziness.

EXAM:
DIAGNOSTIC LUMBAR PUNCTURE UNDER FLUOROSCOPIC GUIDANCE

[Series 1: ortho standard · 1 of 1 slices shown]
[im 1/1]
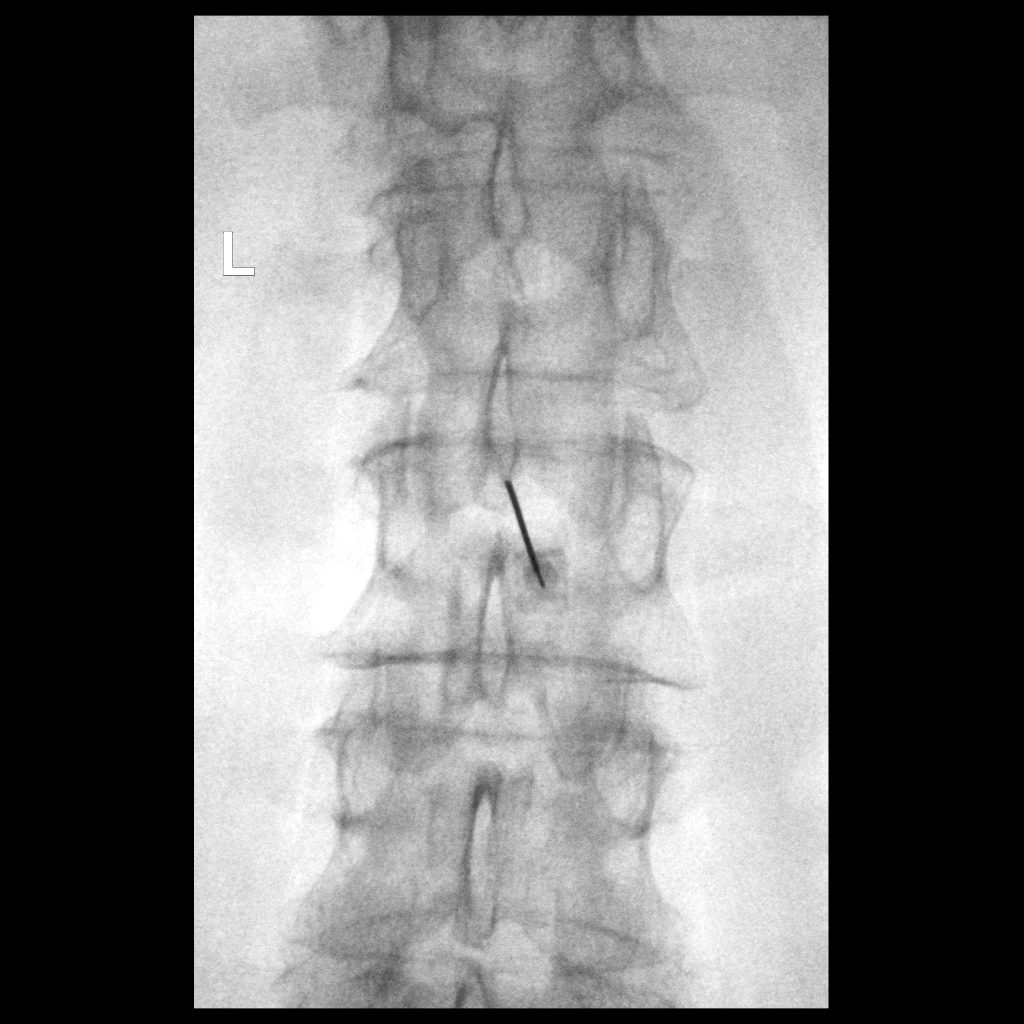

[1 of 1 positions shown; findings below may reference images not displayed]

FLUOROSCOPY TIME:  Radiation Exposure Index (as provided by the
fluoroscopic device): 8.69 uGy*m2

PROCEDURE:
Informed consent was obtained from the patient prior to the
procedure, including potential complications of headache, allergy,
and pain. With the patient prone, the lower back was prepped with
Betadine. 1% Lidocaine was used for local anesthesia. Lumbar
puncture was performed at the L2-3 level using a 20 gauge needle
with return of clear CSF with an opening pressure of 8.5 cm cm water
in the left lateral decubitus position. Fifteen ml of CSF were
obtained for laboratory studies. The patient tolerated the procedure
well and there were no apparent complications.
IMPRESSION: Technically successful fluoroscopic guided lumbar puncture at the
L2-3 level with return of clear CSF under normal pressure.

## 2021-03-18 MED ORDER — DIAZEPAM 5 MG PO TABS
5.0000 mg | ORAL_TABLET | Freq: Once | ORAL | Status: AC
  Administered 2021-03-18: 2.5 mg via ORAL

## 2021-03-18 NOTE — Discharge Instructions (Signed)
Lumbar Puncture Discharge Instructions  1. Go home and rest quietly as needed. You may resume normal activities; however, do not exert yourself strongly or do any heavy lifting today and tomorrow.   2. DO NOT drive today.    3. You may resume your normal diet and medications unless otherwise indicated. Drink lots of extra fluids today and tomorrow.   4. The incidence of headache, nausea, or vomiting is about 5% (one in 20 patients).  If you develop a headache, lie flat for 24 hours and drink plenty of fluids until the headache goes away.  Caffeinated beverages may be helpful. If when you get up you still have a headache when standing, go back to bed and force fluids for another 24 hours.   5. If you develop severe nausea and vomiting or a headache that does not go away with the flat bedrest after 48 hours, please call [phone].   6. Call your physician for a follow-up appointment.  The results of your Lumbar Puncture will be sent directly to your physician and they will contact you.   7. If you have any questions or if complications develop after you arrive home, please call [phone].  Discharge instructions have been explained to the patient.  The patient, or the person responsible for the patient, fully understands these instructions.   Thank you for visiting our office today.

## 2021-03-18 NOTE — Progress Notes (Signed)
Blood drawn from pts left hand for blood work to be sent with LP lab work. 1 successful attempt. Pt tolerated well.

## 2021-03-19 ENCOUNTER — Telehealth: Payer: Self-pay | Admitting: Neurology

## 2021-03-19 DIAGNOSIS — R9082 White matter disease, unspecified: Secondary | ICD-10-CM

## 2021-03-19 DIAGNOSIS — R42 Dizziness and giddiness: Secondary | ICD-10-CM

## 2021-03-19 DIAGNOSIS — G379 Demyelinating disease of central nervous system, unspecified: Secondary | ICD-10-CM

## 2021-03-19 LAB — CYTOLOGY - NON PAP

## 2021-03-19 NOTE — Telephone Encounter (Signed)
Results came over during lunch by fax (in [place]) routed to Dr. Tomi Likens upon receipt and again just now.

## 2021-03-19 NOTE — Telephone Encounter (Signed)
Patient called about her results and wanting to know when she should be seen since having the lumbar puncture.

## 2021-03-19 NOTE — Telephone Encounter (Signed)
I would like the reported results sent to me

## 2021-03-19 NOTE — Telephone Encounter (Signed)
Results forwarded separately to Dr. Tomi Likens.

## 2021-03-19 NOTE — Telephone Encounter (Signed)
Lp Results urgent

## 2021-03-20 NOTE — Telephone Encounter (Signed)
Patient called to check on the status of the results.

## 2021-03-21 NOTE — Telephone Encounter (Signed)
Not all of the labs are back.  So far, labs are unremarkable.  I am particularly waiting on 2 labs.  One of them is still in-process (oligoclonal bands).  The other one is IgG index but I don't see that it was collected.  We need to confirm this and check it while there is still CSF available at the lab.

## 2021-03-22 ENCOUNTER — Ambulatory Visit: Payer: Medicare Other | Admitting: Neurology

## 2021-03-22 NOTE — Telephone Encounter (Signed)
2 nd message left for [facility] in regards to the pt labs. Need to be rent if they weren't done on 03/18/21

## 2021-03-22 NOTE — Telephone Encounter (Signed)
Pt advised, Will call the lab to see why the labs are missing.   Tried [facility] imaging, No answer, LMOVM for Monica Ochoa to give [place] a call back.

## 2021-03-25 NOTE — Telephone Encounter (Signed)
Patient called to check on the status of the results. 

## 2021-03-25 NOTE — Telephone Encounter (Signed)
Please review the labs.

## 2021-03-25 NOTE — Telephone Encounter (Signed)
All labs are unremarkable.  I looked for evidence of infection, malignancy and autoimmune conditions.  There are two tests we check in spinal fluid to evaluate for conditions such as multiple sclerosis.  One of the results is negative.  The other test is not available and I am not sure if it was tested (even though we contacted the lab at least twice about this).  But, thus far, I have no explanation for the findings on MRI and I am not convinced that it is the cause of the dizziness.

## 2021-03-25 NOTE — Telephone Encounter (Signed)
Pt advised of of the labs we have so far,LMVOM for [place] at [place] to see if the last test was missed.

## 2021-03-26 NOTE — Telephone Encounter (Signed)
Spoke to [employer] at [place] imaging, It from her side it looks like the IGg Inex lab was drawn up and sent to the lab.  Sh will contact [facility] to see what happened.   Dr.Jaffe was advise by MA of this.

## 2021-03-27 NOTE — Telephone Encounter (Signed)
Pt advised we are waiting on [facility] per Maudie Mercury sh will call [facility] to see what happen to the IGg Index.

## 2021-03-27 NOTE — Telephone Encounter (Signed)
Patient called and left a message checking on the status of her results.

## 2021-03-29 ENCOUNTER — Ambulatory Visit: Payer: Medicare Other | Admitting: Neurology

## 2021-04-01 LAB — CSF CELL COUNT WITH DIFFERENTIAL
RBC Count, CSF: 1 cells/uL — ABNORMAL HIGH
WBC, CSF: 1 cells/uL (ref 0–5)

## 2021-04-01 LAB — GLUCOSE, CSF: Glucose, CSF: 68 mg/dL (ref 40–80)

## 2021-04-01 LAB — HOUSE ACCOUNT TRACKING

## 2021-04-01 LAB — ARUP MISC ORDER: PRICE:: 109

## 2021-04-01 LAB — GRAM STAIN
GRAM STAIN:: NONE SEEN
MICRO NUMBER:: 11837505
SPECIMEN QUALITY:: ADEQUATE

## 2021-04-01 LAB — CSF CULTURE W GRAM STAIN
MICRO NUMBER:: 11871250
SPECIMEN QUALITY:: ADEQUATE

## 2021-04-01 LAB — PROTEIN, CSF: Total Protein, CSF: 50 mg/dL (ref 15–60)

## 2021-04-01 LAB — LEUKEMIA/LYMPHOMA EVALUATION PANEL

## 2021-04-01 LAB — OLIGOCLONAL BANDS, CSF + SERM

## 2021-04-01 NOTE — Telephone Encounter (Signed)
Pt advised of her most recent lab results. And referral to [place] Dr.Sater.

## 2021-04-01 NOTE — Telephone Encounter (Signed)
Patient wants to be sure Dr. Tomi Likens has seen the report from 03/25/21. She said it was different that the original report. This is in regards to lumber pucture.

## 2021-04-03 ENCOUNTER — Telehealth: Payer: Self-pay

## 2021-04-03 NOTE — Telephone Encounter (Signed)
Pt left voicemail to give her a call back.   Returned pt call, pt okay now. Will give [place] a call if she has any question between now and her appt with Dr.Ater on 05/08/21

## 2021-04-11 ENCOUNTER — Telehealth: Payer: Self-pay | Admitting: Neurology

## 2021-04-11 NOTE — Telephone Encounter (Signed)
Patient called in stating they dropped off a travel form for them to be reimbursed for a trip they could not go on. She wants to confirm we received it. They state it is time sensitive.

## 2021-04-11 NOTE — Telephone Encounter (Signed)
Form is ready.

## 2021-05-09 ENCOUNTER — Other Ambulatory Visit: Payer: Self-pay

## 2021-05-09 ENCOUNTER — Encounter: Payer: Self-pay | Admitting: Neurology

## 2021-05-09 ENCOUNTER — Ambulatory Visit: Payer: Medicare Other | Admitting: Neurology

## 2021-05-09 VITALS — BP 160/68 | HR 81 | Ht 63.0 in | Wt 137.0 lb

## 2021-05-09 DIAGNOSIS — G4489 Other headache syndrome: Secondary | ICD-10-CM | POA: Diagnosis not present

## 2021-05-09 DIAGNOSIS — H539 Unspecified visual disturbance: Secondary | ICD-10-CM | POA: Diagnosis not present

## 2021-05-09 DIAGNOSIS — R2689 Other abnormalities of gait and mobility: Secondary | ICD-10-CM | POA: Diagnosis not present

## 2021-05-09 DIAGNOSIS — R930 Abnormal findings on diagnostic imaging of skull and head, not elsewhere classified: Secondary | ICD-10-CM | POA: Diagnosis not present

## 2021-05-09 NOTE — Progress Notes (Signed)
[facility]  PATIENT: ADANNA ZUCKERMAN DOB: 1946/06/29  REFERRING DOCTOR OR PCP: Metta Clines (neurology) Caren Macadam (PCP SOURCE: Patient, notes from primary care, notes from Dr. Tomi Likens and ED.  Also reviewed imaging reports, lab reports.  MRI images personally reviewed.  _________________________________   HISTORICAL  CHIEF COMPLAINT:  Chief Complaint  Patient presents with   New Patient (Initial Visit)    RM 20 w/ husband. Internal referral from Metta Clines, DO for demyelinating disease, dizziness, white matter abnormality on MRI brain. Describes dizziness as "full head/off balance" No spinning. No falls.     HISTORY OF PRESENT ILLNESS:  I had the pleasure of seeing your patient, Monette Omara, at [facility] for neurologic consultation regarding her headaches, neurologic symptoms, abnormal brain MRI and concern about multiple sclerosis.  She is a 75 year old woman who had dizziness starting November 2021.   She saw her PCP and was treated for vertigo but she did not improve.   She also had nausea with the vertigo.   She felt wobbly with hr walking.    In January, she had elevated Bp and noted tingling all over.   She had MRI of the bran and cervical spine.    It showed spinal stenosis at C5C6 and C6-C7 and spots in the brain including one that enhanced.   She also noted some visual changes.     She felt better a couple weeks later.    She had ACDF C5-C7 (Dr. Annette Stable).   After the surgery, she felt better an her balance was better.   In early April, she had a return of her symptoms, with moreeintensity than earlier.    She also felt very fatigued.    Poor balance, nausea and reduced vision all returned.   She also had dysesthesias and allodynia on both sides of her scalp.    She returned to the ED 02/28/2021 after two episodes where she felt she couldn't move.   She had repeat brain and spine imaging.    She was referrred to Dr. Tomi Likens and had an LP.       There were no oligoclonal bands (she did have 2 paired CSF/serum bands) and IgG index  She has a bilateral HA in her forehead, worse if she bends forward or if she is in flickering lights.    HA's were worse in APril and stated to improve in May.   They are still occurring.   Heat o her forehead helps.      Of note, she had her booster shot for Covid in November about 2 weeks before she had dizzinesss.    Imaging personally reviewed today: MRI of the brain 11/21/2020 shows some scattered T2/FLAIR hyperintense foci in the hemispheres.  1 focus in the right parietal lobe and another focus in the left posterior frontal lobe have subtle enhancement after contrast was administered.  MRI of the brain 02/28/2021 is unchanged compared to the 11/21/2020 MRI.  Specifically, the 2 foci of enhancement were still present.  MRI of the cervical spine 11/21/2020 shows multilevel degenerative changes most advanced at C5-C6 and C6-C7 where there is spinal stenosis and biforaminal narrowing..  The spinal cord appears normal.  MRI of the cervical spine 03/27/2021 shows ACDF at C5-C7.  The spinal cord appears normal.  REVIEW OF SYSTEMS: Constitutional: No fevers, chills, sweats, or change in appetite Eyes: No visual changes, double vision, eye pain Ear, nose and throat: No hearing loss, ear pain, nasal  congestion, sore throat Cardiovascular: No chest pain, palpitations Respiratory:  No shortness of breath at rest or with exertion.   No wheezes GastrointestinaI: No nausea, vomiting, diarrhea, abdominal pain, fecal incontinence Genitourinary:  No dysuria, urinary retention or frequency.  No nocturia. Musculoskeletal:  No neck pain, back pain Integumentary: No rash, pruritus, skin lesions Neurological: as above Psychiatric: No depression at this time.  No anxiety Endocrine: No palpitations, diaphoresis, change in appetite, change in weigh or increased thirst Hematologic/Lymphatic:  No anemia, purpura,  petechiae. Allergic/Immunologic: No itchy/runny eyes, nasal congestion, recent allergic reactions, rashes  ALLERGIES: Allergies  Allergen Reactions   Codeine Palpitations    "makes heart palpitate"    HOME MEDICATIONS:  Current Outpatient Medications:    atorvastatin (LIPITOR) 10 MG tablet, Take 10 mg by mouth every evening., Disp: , Rfl:    calcium carbonate (OS-CAL) 600 MG TABS, Take 600 mg by mouth 2 (two) times daily with a meal., Disp: , Rfl:    carboxymethylcellulose (REFRESH PLUS) 0.5 % SOLN, Place 1 drop into both eyes daily as needed (dry eyes)., Disp: , Rfl:    cholecalciferol (VITAMIN D) 1000 units tablet, Take 1,000 Units by mouth daily., Disp: , Rfl:    Co-Enzyme Q-10 100 MG CAPS, Take 100 mg by mouth daily., Disp: , Rfl:    escitalopram (LEXAPRO) 5 MG tablet, Take 5 mg by mouth daily., Disp: , Rfl:    Flaxseed, Linseed, (FLAXSEED OIL) 1000 MG CAPS, Take 1,000 mg by mouth daily., Disp: , Rfl:    fluticasone (FLONASE) 50 MCG/ACT nasal spray, Place 1 spray into both nostrils daily as needed for allergies or rhinitis., Disp: , Rfl:    losartan (COZAAR) 25 MG tablet, Take 25 mg by mouth daily., Disp: , Rfl:    ondansetron (ZOFRAN) 4 MG tablet, Take 1 tablet (4 mg total) by mouth every 8 (eight) hours as needed for nausea or vomiting., Disp: 15 tablet, Rfl: 0   Probiotic Product (PROBIOTIC COLON SUPPORT PO), Take 1 capsule by mouth every morning., Disp: , Rfl:   PAST MEDICAL HISTORY: Past Medical History:  Diagnosis Date   Diabetes mellitus without complication ([place])    Family history of adverse reaction to anesthesia    Sister has a hard time waking up after anesthesia and post op N/V   Female bladder prolapse    Hyperlipidemia 02/14/2016   Hypertension    Intermittent palpitations    03-23-2018 per pt caused due to anxiety/ upset   Osteopenia 06/2019   T score -2.1 FRAX 13% / 3.3% overall stable from prior DEXA   Palpitations    PVC's (premature ventricular  contractions) 03/17/2016   Wears glasses     PAST SURGICAL HISTORY: Past Surgical History:  Procedure Laterality Date   ABDOMINAL HYSTERECTOMY  1977   ANAL SPHINCTEROPLASTY  02/04/2011   WITH PERINEORRHAPHY   ANTERIOR CERVICAL DECOMP/DISCECTOMY FUSION N/A 12/31/2020   Procedure: Anterior Cervical Decompression Fusion  - Cervical five-Cervical six - Cervical six-Cervical seven;  Surgeon: Earnie Larsson, MD;  Location: [facility];  Service: Neurosurgery;  Laterality: N/A;   BREAST EXCISIONAL BIOPSY Left 1989   LUMBAR SPINE SURGERY  2000   TOTAL VAGINAL HYSTERECTOMY  1977   W/  ANTERIOR AND POSTERIOR REPAIR    FAMILY HISTORY: Family History  Problem Relation Age of Onset   Hypertension Mother    Cervical cancer Mother    Heart disease Mother    Uterine cancer Mother    Angina Mother    Diabetes Father  Other Father        black lung   Hypertension Brother    Diabetes Brother    Hypertension Sister    Heart Problems Sister        valve problems   Crohn's disease Sister    Breast cancer Neg Hx     SOCIAL HISTORY:  Social History   Socioeconomic History   Marital status: Married    Spouse name: Not on file   Number of children: Not on file   Years of education: Not on file   Highest education level: Not on file  Occupational History   Not on file  Tobacco Use   Smoking status: Never   Smokeless tobacco: Never  Vaping Use   Vaping Use: Never used  Substance and Sexual Activity   Alcohol use: No    Alcohol/week: 0.0 standard drinks   Drug use: No   Sexual activity: Yes    Birth control/protection: Surgical    Comment: 1st intercourse- 19, partners- 1  Other Topics Concern   Not on file  Social History Narrative   Mostly coffee daily   Right handed   Lives w/ husband   Social Determinants of Health   Financial Resource Strain: Not on file  Food Insecurity: Not on file  Transportation Needs: Not on file  Physical Activity: Not on file  Stress: Not on file   Social Connections: Not on file  Intimate Partner Violence: Not on file     PHYSICAL EXAM  Vitals:   05/09/21 1309  BP: (!) 160/68  Pulse: 81  Weight: 137 lb (62.1 kg)  Height: 5\' 3"  (1.6 m)    Body mass index is 24.27 kg/m.   General: The patient is well-developed and well-nourished and in no acute distress  HEENT:  Head is [place]/AT.  Sclera are anicteric.  Funduscopic exam shows normal optic discs and retinal vessels.  Neck: No carotid bruits are noted.  The neck is nontender.  Cardiovascular: The heart has a regular rate and rhythm with a normal S1 and S2. There were no murmurs, gallops or rubs.    Skin: Extremities are without rash or  edema.  Musculoskeletal:  Back is nontender  Neurologic Exam  Mental status: The patient is alert and oriented x 3 at the time of the examination. The patient has apparent normal recent and remote memory, with an apparently normal attention span and concentration ability.   Speech is normal.  Cranial nerves: Extraocular movements are full. Pupils are equal, round, and reactive to light and accomodation.  Reduced color saturation OD There is good facial sensation to soft touch bilaterally.Facial strength is normal.  Trapezius and sternocleidomastoid strength is normal. No dysarthria is noted.  The tongue is midline, and the patient has symmetric elevation of the soft palate. No obvious hearing deficits are noted.  Motor:  Muscle bulk is normal.   Tone is normal. Strength is  5 / 5 in all 4 extremities.   Sensory: Sensory testing is intact to pinprick, soft touch and vibration sensation in the arms but she reported some sensory asymmetry in the legs though the pattern was not consistent..  Coordination: Cerebellar testing reveals good finger-nose-finger and heel-to-shin bilaterally.  Gait and station: Station is normal.   Gait is normal. Tandem gait is mildly wide.  There is no retropulsion.. Romberg is negative.   Reflexes: Deep tendon  reflexes are symmetric and normal bilaterally.   Plantar responses are flexor.    DIAGNOSTIC DATA (LABS,  IMAGING, TESTING) - I reviewed patient records, labs, notes, testing and imaging myself where available.  Lab Results  Component Value Date   WBC 6.7 02/28/2021   HGB 13.6 02/28/2021   HCT 40.5 02/28/2021   MCV 84.9 02/28/2021   PLT 265 02/28/2021      Component Value Date/Time   NA 133 (L) 02/28/2021 0602   K 3.3 (L) 02/28/2021 0602   CL 99 02/28/2021 0602   CO2 24 02/28/2021 0602   GLUCOSE 126 (H) 02/28/2021 0602   BUN 9 02/28/2021 0602   CREATININE 0.78 02/28/2021 0602   CREATININE 0.84 02/14/2016 0845   CALCIUM 9.4 02/28/2021 0602   PROT 7.8 11/18/2020 1817   ALBUMIN 4.7 11/18/2020 1817   AST 29 11/18/2020 1817   ALT 25 11/18/2020 1817   ALKPHOS 72 11/18/2020 1817   BILITOT 0.4 11/18/2020 1817   GFRNONAA >60 02/28/2021 0602   GFRAA >60 03/23/2018 1332   Lab Results  Component Value Date   CHOL 269 (H) 02/14/2016   HDL 65 02/14/2016   LDLCALC 179 (H) 02/14/2016   TRIG 126 02/14/2016   CHOLHDL 4.1 02/14/2016   Lab Results  Component Value Date   HGBA1C 6.1 (H) 12/27/2020   No results found for: VITAMINB12 Lab Results  Component Value Date   TSH 1.878 02/28/2021       ASSESSMENT AND PLAN  Abnormal MRI of head - Plan: Angiotensin converting enzyme, Pan-ANCA, Visual evoked potential test, MR BRAIN W WO CONTRAST  Balance disorder - Plan: Angiotensin converting enzyme, Pan-ANCA, MR BRAIN W WO CONTRAST  Other headache syndrome - Plan: Angiotensin converting enzyme, Pan-ANCA, MR BRAIN W WO CONTRAST  Vision disturbance - Plan: Visual evoked potential test, MR BRAIN W WO CONTRAST   In summary, Ms. Sebastiano is a 75 year old woman with an abnormal brain MRI, headache, dizziness and visual changes.  On examination, she did have some asymmetry of color vision, reduced on the right.  Her exam was a wise unremarkable.  I personally reviewed multiple MRIs.   Although there was a concern that her second brain MRI from April 2022 had shown a new enhancing lesion not present in January 2022, that focus was also clearly present at the time and enhanced.  Therefore, she has two foci, 1 on each side where the enhancement was persistent over 3 months.  That would be unlikely to occur with MS.  The foci are small and would be unlikely to cause any symptoms.  It is possible that the foci could represent small developmental venous anomalies.  However, the appearance is not typical for that.  Although sarcoid and vasculitis are unlikely to have this appearance we will check an angiotensin-converting enzyme level and pan ANCA.  I discussed with her that I would like to repeat an MRI of her brain later in the year to make sure that there are not additional changes and to better characterize the persistence of the enhancement.  We will check a visual evoked potential since she had reduced color saturation OD.  She will return to see me in 6 months and we will check the MRI a couple weeks before the visit.  She should call sooner if she has any significant new or worsening neurologic symptoms.     Tonji Elliff A. Felecia Shelling, MD, [facility] 4/48/1856, 3:14 PM Certified in Neurology, Clinical Neurophysiology, Sleep Medicine and Neuroimaging  [facility] [address] [place], [place] [address] [phone]

## 2021-05-13 ENCOUNTER — Telehealth: Payer: Self-pay | Admitting: Neurology

## 2021-05-13 LAB — ANCA PROFILE
Anti-MPO Antibodies: <0.2 units (ref 0.0–0.9)
Anti-PR3 Antibodies: <0.2 units (ref 0.0–0.9)
Atypical pANCA: <1:20 {titer}
C-ANCA: <1:20 {titer}
P-ANCA: <1:20 {titer}

## 2021-05-13 LAB — ANGIOTENSIN CONVERTING ENZYME: Angio Convert Enzyme: 64 U/L (ref 14–82)

## 2021-05-13 NOTE — Telephone Encounter (Signed)
05/13/21 [employer] Medicare Auth: NPR case #[id number] sent to [facility]

## 2021-05-14 NOTE — Telephone Encounter (Signed)
Received message from [place] at [facility]. Dr. Tomi Likens ordered this same scan and patient is scheduled to have it done at GI on 7/26.

## 2021-05-28 ENCOUNTER — Ambulatory Visit: Payer: Medicare Other | Admitting: Neurology

## 2021-05-28 DIAGNOSIS — R930 Abnormal findings on diagnostic imaging of skull and head, not elsewhere classified: Secondary | ICD-10-CM

## 2021-05-28 DIAGNOSIS — H539 Unspecified visual disturbance: Secondary | ICD-10-CM

## 2021-05-28 NOTE — Progress Notes (Signed)
         Full Name: Monica Ochoa Gender: Female MRN #: [id number] Date of Birth: January 21, 1946    Visit Date: 05/28/2021 10:13 Age: 75 Years Examining Physician: Arlice Colt, MD  Patient History:   Vision w/ correction R: 20/30 L: 20/30 Both: 20/30  History: [name] is a 76 year old woman with visual disturbance and abnormal MRI of the brain.  Vision w/ correction R: 20/30 L: 20/30 Both: 20/30  Description:  The visual evoked response test was performed today using 32 x 32 check sizes. The P100 latencies were 110 ms OD and 109 ms OS.  The absolute latencies for the N1 and the P100 wave forms were within normal limits bilaterally. The amplitudes for the P100 wave forms were also within normal limits bilaterally.   Impression: The visual evoked response test above was within normal limits bilaterally. No evidence of conduction slowing was seen within the anterior visual pathways on either side on today's evaluation.  Demontray Franta A. Felecia Shelling, MD, PhD, FAAN Certified in Neurology, Clinical Neurophysiology, Sleep Medicine, Pain Medicine and Neuroimaging Director, [place] [facility] [address], [place], [place] [address] [phone]  Verbal informed consent was obtained from the patient, patient was informed of potential risk of procedure, including bruising, bleeding, hematoma formation, infection, muscle weakness, muscle pain, numbness, among others.        VEP-Pattern    Run Label N75 P100 N145 P100 Size    ms ms ms V   R  - VEP  1.1  O1-Fz 71 108 139 8.6 16  1.2  Oz-Fz 80 108 132 9.0   1.3  O2-Fz 88 114 132 9.4   L  - VEP  1.1  O1-Fz 87 107 155 6.8 16  1.2  Oz-Fz 86 108 153 8.6   1.3  O2-Fz 87 112 144 10.2

## 2021-06-11 ENCOUNTER — Other Ambulatory Visit: Payer: Medicare Other

## 2021-06-19 NOTE — Progress Notes (Signed)
NEUROLOGY FOLLOW UP OFFICE NOTE  Monica Ochoa [id number]  Assessment/Plan:   White matter abnormality of the brain - nonspecific pattern.  Two small enhancing foci over 3 month period unlikely to be MS.  Labs for sarcoid and vasculitis negative.  Possibly may represent small developmental venous anomalies.  At this point, no clear diagnosis Dizziness - Unrelated to MRI findings.  I have no clear diagnosis  I have no clear diagnosis for dizziness.  May want to discuss with Dr. Mannie Stabile about increasing Lexapro.  SSRIs may be helpful in chronic dizziness and she has reported some improvement in symptoms since starting it.   Follow up with Dr. Felecia Shelling and repeat MRI as scheduled.   Subjective:  Monica Ochoa is a 75 year old right-handed female with type 2 diabetes mellitus, HTN, HLD and intermittent palpitations who follows up for dizziness and white matter changes on brain MRI.  She is accompanied by her husband who supplements history.   UPDATE: Underwent lumbar puncture on 03/18/2021 which demonstrated CSF cell count 1, negative gram stain, and normal IgG index 0.61, 2 oligoclonal bands detected in both CSF and serum.  I referred her to Dr. Arlice Colt, an [place] specialist, for second opinion.  He noted that the 2 enhancing foci on brain MRI were present in January and April, making MS lesions unlikely and may actually represent small developmental venous anomalies.  He checked labs for sarcoid and vasculitis with normal serum ACE of 64 and negative pan-ANCA panel.  Recommended repeat MRI later in the year.    Dizziness continues on and off.  It is now occurring every other day, when she bends over and will last most of the day.  Worse in the morning.  It is a sensation of feeling off-balance and sometimes fogginess but not spinning.  Still with nausea and sensitivity with light but not as severe as in the past.  No associated headache but a week ago she started experiencing a mild  non-throbbing frontal headache in the mornings a week ago.  She will take an Advil mid-day and uses a heating pad on her forehead, she feels better in the afternoon.  Started on Lexapro '5mg'$  3 months ago.     HISTORY: She began experiencing dizziness since 09/17/2020.  It is vague and difficult to describe but it is not a spinning sensation.  It is positional, aggravated with head movement to either side or bending over.  It lasts just a few seconds.  She has associated nausea.  No headache, ear pain, numbness, weakness, visual disturbance.  Formal eye exam was normal except for dry eyes.  She saw ENT.  CT sinuses were negative.  On 11/18/2020, she had developed new onset severe non-throbbing frontal headache.  She went to the ED where she had an MRI of the brain without contrast and MRV of head with and without contrast which showed a T2/FLAIR hyperintense focus in the right parietal white matter with associated mild contrast enhancement, but otherwise unremarkable.  Follow up MRI of cervical spine with and without contrast on 11/21/2020 showed multilevel spondylosis with moderate canal stenosis at C5-6 and C6-7 as well as severe right and moderate left neural foraminal stenosis at C5-6 and moderate right and mild left neural foraminal stenosis at C6-7 but no cord abnormalities.  She denies prior history of migraines or other focal neurologic symptoms.  She underwent C5-7 ACDF on 12/31/2020.  Dizziness resolved.  However, the dizziness returned 4/1, worse than before  the surgery.  It seemed to have been triggered by exposure to bright light.  Describes lightheadedness, occipital head pressure, burning behind her neck, trouble reading or scrolling with her eyes and unsteady gait as well as tingling in the arms and legs.  She went to the ED on 02/28/2021. She was found to be orthostatic.  MRI of brain with and without contrast again showed periventricular and juxtacortical T2/FLAIR hyperintensities and mild enhancing  hyperintense foci of the right parietal and left frontal white matter, stable compared to prior imaging from January.  MRI of cervical spine with and without contrast showed interval C5-C7 ACDF without significant stenosis but no abnormal cord.  She finds it difficult to concentrate.  She says her taste comes and goes.  She continues to be very sensitive to light.  She is also experiencing gastrointestinal symptoms such as upset stomach or nausea and burping after meals.  She reports extreme fatigue.  PAST MEDICAL HISTORY: Past Medical History:  Diagnosis Date   Diabetes mellitus without complication ([place])    Family history of adverse reaction to anesthesia    Sister has a hard time waking up after anesthesia and post op N/V   Female bladder prolapse    Hyperlipidemia 02/14/2016   Hypertension    Intermittent palpitations    03-23-2018 per pt caused due to anxiety/ upset   Osteopenia 06/2019   T score -2.1 FRAX 13% / 3.3% overall stable from prior DEXA   Palpitations    PVC's (premature ventricular contractions) 03/17/2016   Wears glasses     MEDICATIONS: Current Outpatient Medications on File Prior to Visit  Medication Sig Dispense Refill   atorvastatin (LIPITOR) 10 MG tablet Take 10 mg by mouth every evening.     calcium carbonate (OS-CAL) 600 MG TABS Take 600 mg by mouth 2 (two) times daily with a meal.     carboxymethylcellulose (REFRESH PLUS) 0.5 % SOLN Place 1 drop into both eyes daily as needed (dry eyes).     cholecalciferol (VITAMIN D) 1000 units tablet Take 1,000 Units by mouth daily.     Co-Enzyme Q-10 100 MG CAPS Take 100 mg by mouth daily.     escitalopram (LEXAPRO) 5 MG tablet Take 5 mg by mouth daily.     Flaxseed, Linseed, (FLAXSEED OIL) 1000 MG CAPS Take 1,000 mg by mouth daily.     fluticasone (FLONASE) 50 MCG/ACT nasal spray Place 1 spray into both nostrils daily as needed for allergies or rhinitis.     losartan (COZAAR) 25 MG tablet Take 25 mg by mouth daily.      ondansetron (ZOFRAN) 4 MG tablet Take 1 tablet (4 mg total) by mouth every 8 (eight) hours as needed for nausea or vomiting. 15 tablet 0   Probiotic Product (PROBIOTIC COLON SUPPORT PO) Take 1 capsule by mouth every morning.     No current facility-administered medications on file prior to visit.    ALLERGIES: Allergies  Allergen Reactions   Codeine Palpitations    "makes heart palpitate"    FAMILY HISTORY: Family History  Problem Relation Age of Onset   Hypertension Mother    Cervical cancer Mother    Heart disease Mother    Uterine cancer Mother    Angina Mother    Diabetes Father    Other Father        black lung   Hypertension Brother    Diabetes Brother    Hypertension Sister    Heart Problems Sister  valve problems   Crohn's disease Sister    Breast cancer Neg Hx       Objective:  Blood pressure 118/70, pulse 72, height '5\' 5"'$  (1.651 m), weight 130 lb (59 kg), SpO2 98 %. General: No acute distress.  Patient appears well-groomed.   Head:  Normocephalic/atraumatic Eyes:  Fundi examined but not visualized Neck: supple, no paraspinal tenderness, full range of motion Heart:  Regular rate and rhythm Lungs:  Clear to auscultation bilaterally Back: No paraspinal tenderness Neurological Exam: alert and oriented to person, place, and time.  Speech fluent and not dysarthric, language intact.  CN II-XII intact. Bulk and tone normal, muscle strength 5/5 throughout.  Sensation to light touch intact.  Deep tendon reflexes 2+ throughout, toes downgoing.  Finger to nose testing intact.  Gait normal, Romberg negative.   Metta Clines, DO  CC: Caren Macadam, MD

## 2021-06-20 ENCOUNTER — Ambulatory Visit: Payer: Medicare Other | Admitting: Neurology

## 2021-06-20 ENCOUNTER — Encounter: Payer: Self-pay | Admitting: Neurology

## 2021-06-20 ENCOUNTER — Other Ambulatory Visit: Payer: Self-pay

## 2021-06-20 VITALS — BP 118/70 | HR 72 | Ht 65.0 in | Wt 130.0 lb

## 2021-06-20 DIAGNOSIS — R42 Dizziness and giddiness: Secondary | ICD-10-CM

## 2021-06-20 DIAGNOSIS — R930 Abnormal findings on diagnostic imaging of skull and head, not elsewhere classified: Secondary | ICD-10-CM | POA: Diagnosis not present

## 2021-06-20 NOTE — Patient Instructions (Signed)
Follow up with Dr. Felecia Shelling Discuss increasing Lexapro with Dr. Mannie Stabile

## 2021-07-19 ENCOUNTER — Other Ambulatory Visit: Payer: Self-pay | Admitting: Family Medicine

## 2021-07-19 DIAGNOSIS — M8580 Other specified disorders of bone density and structure, unspecified site: Secondary | ICD-10-CM

## 2021-07-19 DIAGNOSIS — Z1231 Encounter for screening mammogram for malignant neoplasm of breast: Secondary | ICD-10-CM

## 2021-10-23 ENCOUNTER — Ambulatory Visit
Admission: RE | Admit: 2021-10-23 | Discharge: 2021-10-23 | Disposition: A | Discharge status: Home / Self Care | Payer: Medicare Other | Source: Ambulatory Visit | Attending: Neurology | Admitting: Neurology

## 2021-10-23 ENCOUNTER — Other Ambulatory Visit: Payer: Self-pay

## 2021-10-23 DIAGNOSIS — H539 Unspecified visual disturbance: Secondary | ICD-10-CM

## 2021-10-23 DIAGNOSIS — R2689 Other abnormalities of gait and mobility: Secondary | ICD-10-CM

## 2021-10-23 DIAGNOSIS — R930 Abnormal findings on diagnostic imaging of skull and head, not elsewhere classified: Secondary | ICD-10-CM

## 2021-10-23 DIAGNOSIS — G4489 Other headache syndrome: Secondary | ICD-10-CM

## 2021-10-23 MED ORDER — GADOBENATE DIMEGLUMINE 529 MG/ML IV SOLN
12.0000 mL | Freq: Once | INTRAVENOUS | Status: AC | PRN
  Administered 2021-10-23: 12 mL via INTRAVENOUS

## 2021-11-07 ENCOUNTER — Ambulatory Visit: Payer: Medicare Other | Admitting: Nurse Practitioner

## 2021-11-14 ENCOUNTER — Encounter: Payer: Self-pay | Admitting: Neurology

## 2021-11-14 ENCOUNTER — Ambulatory Visit: Payer: Medicare Other | Admitting: Neurology

## 2021-11-14 VITALS — BP 158/68 | HR 78 | Ht 65.0 in | Wt 128.0 lb

## 2021-11-14 DIAGNOSIS — R42 Dizziness and giddiness: Secondary | ICD-10-CM

## 2021-11-14 DIAGNOSIS — R2689 Other abnormalities of gait and mobility: Secondary | ICD-10-CM

## 2021-11-14 DIAGNOSIS — H539 Unspecified visual disturbance: Secondary | ICD-10-CM | POA: Diagnosis not present

## 2021-11-14 DIAGNOSIS — R930 Abnormal findings on diagnostic imaging of skull and head, not elsewhere classified: Secondary | ICD-10-CM | POA: Diagnosis not present

## 2021-11-14 MED ORDER — ZONISAMIDE 25 MG PO CAPS: ORAL_CAPSULE | ORAL | 5 refills | Status: DC

## 2021-11-14 NOTE — Progress Notes (Signed)
[facility]  PATIENT: Monica Ochoa DOB: March 08, 1946  REFERRING DOCTOR OR PCP: Caren Macadam (PCP) SOURCE: Patient, notes from primary care, notes from Dr. Tomi Likens and ED.  Also reviewed imaging reports, lab reports.  MRI images personally reviewed.  _________________________________   HISTORICAL  CHIEF COMPLAINT:  Chief Complaint  Patient presents with   Follow-up    RM 11. Last seen 05/09/21. Has mostly same sx as last visit. When bending over, head feels more heavy/full. Feels off balance with walking sometimes. Has light sensitivity. When she reads, movement of eyes, makes her head feel "weird". Unable to look up and down isles at grocery store. Went to [facility] 10/11/21. Was there 3 days. Sx improved while there/pressure changes? [place]     HISTORY OF PRESENT ILLNESS:  Monica Ochoa is a 75 y.o. woman with headaches, neurologic symptoms, abnormal brain MRI and concern about multiple sclerosis.  UPDATE 11/14/2021 She continues to have similar symptoms.  She gets a pressure sensation when she ends forward.  She feels a little off balanced at times like she is pitched forwaed but this is slightly less than earlier this year.    She has had some visual changes - if she scans like reading or looking at a shelf, she feels weird.     If she gets a headache, heat helps.   It also helps the full sensation.     She went on vacation in [place] and felt she had no symptoms.  However, on her way back home, she began to experience the same symptoms again.     She has active dreams/nightmare usually she is lost or being attacked.  She screams out and sometimes punches or kicks.  These are now 5 nights a week/  These are almost only in the mornings not the first coupe ours of sleep.     She snores a bit but not throughout the night.   She is sometimes sleepy but does not nap.     I reviewed her recent brain MRI and compared it to  the one from earlier in the year.  The 2 small enhancing foci are unchanged.  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  Neither of these should be symptomatic.  She also has some scattered T2/FLAIR hyperintense foci consistent with mild chronic microvascular ischemic change.  History:  She is a 75 year old woman who had dizziness starting November 2021.   She saw her PCP and was treated for vertigo but she did not improve.   She also had nausea with the vertigo.   She felt wobbly with hr walking.    In January, she had elevated Bp and noted tingling all over.   She had MRI of the bran and cervical spine.    It showed spinal stenosis at C5C6 and C6-C7 and spots in the brain including one that enhanced.   She also noted some visual changes.     She felt better a couple weeks later.    She had ACDF C5-C7 (Dr. Annette Stable).   After the surgery, she felt better an her balance was better.   In early April, she had a return of her symptoms, with moreeintensity than earlier.    She also felt very fatigued.    Poor balance, nausea and reduced vision all returned.   She also had dysesthesias  and allodynia on both sides of her scalp.    She returned to the ED 02/28/2021 after two episodes where she felt she couldn't move.   She had repeat brain and spine imaging.    She was referrred to Dr. Tomi Likens and had an LP.      There were no oligoclonal bands (she did have 2 paired CSF/serum bands) and IgG index  She has a bilateral HA in her forehead, worse if she bends forward or if she is in flickering lights.    HA's were worse in APril and stated to improve in May.   They are still occurring.   Heat o her forehead helps.      Of note, she had her booster shot for Covid in November about 2 weeks before she had dizzinesss.    Imaging  MRI of the brain 11/21/2020 shows some scattered T2/FLAIR hyperintense foci in the hemispheres.  1  focus in the right parietal lobe and another focus in the left posterior frontal lobe have subtle enhancement after contrast was administered.  MRI of the brain 02/28/2021 is unchanged compared to the 11/21/2020 MRI.  Specifically, the 2 foci of enhancement were still present.  MRI of the cervical spine 11/21/2020 shows multilevel degenerative changes most advanced at C5-C6 and C6-C7 where there is spinal stenosis and biforaminal narrowing..  The spinal cord appears normal.  MRI of the cervical spine 03/27/2021 shows ACDF at C5-C7.  The spinal cord appears normal.  MRI of the brain 10/23/2021 was unchanged.  There are scattered T2/FLAIR hyperintense foci in the hemispheres consistent with chronic microvascular ischemic change, unchanged compared to the 02/28/2021 MRI.     There are 2 enhancing foci, 1 in each hemisphere.  Both of these foci were present on the MRI from 02/28/2021.  The focus on the right also has some linear component to it would be most consistent with a small developmental venous anomaly.  The focus on the left could represent a small capillary telangiectasia.  REVIEW OF SYSTEMS: Constitutional: No fevers, chills, sweats, or change in appetite Eyes: No visual changes, double vision, eye pain Ear, nose and throat: No hearing loss, ear pain, nasal congestion, sore throat Cardiovascular: No chest pain, palpitations Respiratory:  No shortness of breath at rest or with exertion.   No wheezes GastrointestinaI: No nausea, vomiting, diarrhea, abdominal pain, fecal incontinence Genitourinary:  No dysuria, urinary retention or frequency.  No nocturia. Musculoskeletal:  No neck pain, back pain Integumentary: No rash, pruritus, skin lesions Neurological: as above Psychiatric: No depression at this time.  No anxiety Endocrine: No palpitations, diaphoresis, change in appetite, change in weigh or increased thirst Hematologic/Lymphatic:  No anemia, purpura, petechiae. Allergic/Immunologic: No  itchy/runny eyes, nasal congestion, recent allergic reactions, rashes  ALLERGIES: Allergies  Allergen Reactions   Codeine Palpitations    "makes heart palpitate"    HOME MEDICATIONS:  Current Outpatient Medications:    atorvastatin (LIPITOR) 10 MG tablet, Take 10 mg by mouth every evening., Disp: , Rfl:    calcium carbonate (OS-CAL) 600 MG TABS, Take 600 mg by mouth 2 (two) times daily with a meal., Disp: , Rfl:    carboxymethylcellulose (REFRESH PLUS) 0.5 % SOLN, Place 1 drop into both eyes daily as needed (dry eyes)., Disp: , Rfl:    cholecalciferol (VITAMIN D) 1000 units tablet, Take 1,000 Units by mouth daily., Disp: , Rfl:    Co-Enzyme Q-10 100 MG CAPS, Take 100 mg by mouth daily., Disp: , Rfl:  escitalopram (LEXAPRO) 10 MG tablet, Take 10 mg by mouth daily., Disp: , Rfl:    Flaxseed, Linseed, (FLAXSEED OIL) 1000 MG CAPS, Take 1,000 mg by mouth daily., Disp: , Rfl:    fluticasone (FLONASE) 50 MCG/ACT nasal spray, Place 1 spray into both nostrils daily as needed for allergies or rhinitis., Disp: , Rfl:    losartan (COZAAR) 25 MG tablet, Take 25 mg by mouth daily., Disp: , Rfl:    ondansetron (ZOFRAN) 4 MG tablet, Take 1 tablet (4 mg total) by mouth every 8 (eight) hours as needed for nausea or vomiting., Disp: 15 tablet, Rfl: 0   Probiotic Product (PROBIOTIC COLON SUPPORT PO), Take 1 capsule by mouth every morning., Disp: , Rfl:    zonisamide (ZONEGRAN) 25 MG capsule, One or two at night, Disp: 60 capsule, Rfl: 5  PAST MEDICAL HISTORY: Past Medical History:  Diagnosis Date   Diabetes mellitus without complication ([place])    Family history of adverse reaction to anesthesia    Sister has a hard time waking up after anesthesia and post op N/V   Female bladder prolapse    Hyperlipidemia 02/14/2016   Hypertension    Intermittent palpitations    03-23-2018 per pt caused due to anxiety/ upset   Osteopenia 06/2019   T score -2.1 FRAX 13% / 3.3% overall stable from prior DEXA    Palpitations    PVC's (premature ventricular contractions) 03/17/2016   Wears glasses     PAST SURGICAL HISTORY: Past Surgical History:  Procedure Laterality Date   ABDOMINAL HYSTERECTOMY  1977   ANAL SPHINCTEROPLASTY  02/04/2011   WITH PERINEORRHAPHY   ANTERIOR CERVICAL DECOMP/DISCECTOMY FUSION N/A 12/31/2020   Procedure: Anterior Cervical Decompression Fusion  - Cervical five-Cervical six - Cervical six-Cervical seven;  Surgeon: Earnie Larsson, MD;  Location: [facility];  Service: Neurosurgery;  Laterality: N/A;   BREAST EXCISIONAL BIOPSY Left 1989   LUMBAR SPINE SURGERY  2000   TOTAL VAGINAL HYSTERECTOMY  1977   W/  ANTERIOR AND POSTERIOR REPAIR    FAMILY HISTORY: Family History  Problem Relation Age of Onset   Hypertension Mother    Cervical cancer Mother    Heart disease Mother    Uterine cancer Mother    Angina Mother    Diabetes Father    Other Father        black lung   Hypertension Brother    Diabetes Brother    Hypertension Sister    Heart Problems Sister        valve problems   Crohn's disease Sister    Breast cancer Neg Hx     SOCIAL HISTORY:  Social History   Socioeconomic History   Marital status: Married    Spouse name: Not on file   Number of children: Not on file   Years of education: Not on file   Highest education level: Not on file  Occupational History   Not on file  Tobacco Use   Smoking status: Never   Smokeless tobacco: Never  Vaping Use   Vaping Use: Never used  Substance and Sexual Activity   Alcohol use: No    Alcohol/week: 0.0 standard drinks   Drug use: No   Sexual activity: Yes    Birth control/protection: Surgical    Comment: 1st intercourse- 19, partners- 1  Other Topics Concern   Not on file  Social History Narrative   Mostly coffee daily   Right handed   Lives w/ husband   Social Determinants of  Health   Financial Resource Strain: Not on file  Food Insecurity: Not on file  Transportation Needs: Not on file  Physical  Activity: Not on file  Stress: Not on file  Social Connections: Not on file  Intimate Partner Violence: Not on file     PHYSICAL EXAM  Vitals:   11/14/21 1408  BP: (!) 158/68  Pulse: 78  Weight: 128 lb (58.1 kg)  Height: 5\' 5"  (1.651 m)    Body mass index is 21.3 kg/m.   General: The patient is well-developed and well-nourished and in no acute distress  HEENT:  Head is [place]/AT.  Sclera are anicteric.    Skin: Extremities are without rash or  edema.  Musculoskeletal:  Back is nontender  Neurologic Exam  Mental status: The patient is alert and oriented x 3 at the time of the examination. The patient has apparent normal recent and remote memory, with an apparently normal attention span and concentration ability.   Speech is normal.  Cranial nerves: Extraocular movements are full.  Facial strength and sensation was normal.  No obvious hearing deficits are noted.  Motor:  Muscle bulk is normal.   Tone is normal. Strength is  5 / 5 in all 4 extremities.   Sensory: Sensory testing is intact to pinprick, soft touch and vibration sensation  Coordination: Cerebellar testing reveals good finger-nose-finger and heel-to-shin bilaterally.  Gait and station: Station is normal.   Gait is normal. Tandem gait is mildly wide.  There is no retropulsion.. Romberg is negative.   Reflexes: Deep tendon reflexes are symmetric and normal bilaterally.       DIAGNOSTIC DATA (LABS, IMAGING, TESTING) - I reviewed patient records, labs, notes, testing and imaging myself where available.  Lab Results  Component Value Date   WBC 6.7 02/28/2021   HGB 13.6 02/28/2021   HCT 40.5 02/28/2021   MCV 84.9 02/28/2021   PLT 265 02/28/2021      Component Value Date/Time   NA 133 (L) 02/28/2021 0602   K 3.3 (L) 02/28/2021 0602   CL 99 02/28/2021 0602   CO2 24 02/28/2021 0602   GLUCOSE 126 (H) 02/28/2021 0602   BUN 9 02/28/2021 0602   CREATININE 0.78 02/28/2021 0602   CREATININE 0.84 02/14/2016  0845   CALCIUM 9.4 02/28/2021 0602   PROT 7.8 11/18/2020 1817   ALBUMIN 4.7 11/18/2020 1817   AST 29 11/18/2020 1817   ALT 25 11/18/2020 1817   ALKPHOS 72 11/18/2020 1817   BILITOT 0.4 11/18/2020 1817   GFRNONAA >60 02/28/2021 0602   GFRAA >60 03/23/2018 1332   Lab Results  Component Value Date   CHOL 269 (H) 02/14/2016   HDL 65 02/14/2016   LDLCALC 179 (H) 02/14/2016   TRIG 126 02/14/2016   CHOLHDL 4.1 02/14/2016   Lab Results  Component Value Date   HGBA1C 6.1 (H) 12/27/2020   No results found for: VITAMINB12 Lab Results  Component Value Date   TSH 1.878 02/28/2021       ASSESSMENT AND PLAN  Abnormal MRI of head  Dizziness  Vision disturbance  Balance disorder   Trial of melatonin for REM Behavior disorder.  If not much better, try clonazepam.  No evidence of PD, LBD or other synucleinopathy on exam, though REM behavior disorder may precede other symptoms for many years.   HA's have some migrainous features - trial of zonisamide 25-50 mg (similar to topiramate but better tolerated). The MRI of the brain is stable.  There are 2  small enhancing foci that were read by radiology as being worrisome for active demyelination.  However, the characteristics are not typical for active demyelination and the continued enhancement with stable pattern is much more consistent with a developmental venous anomaly and capillary telangiectasia.  I do not think there is any evidence of MS. Rtc 6 months or sooner if new or worsnening neurologic issues    Neri Vieyra A. Felecia Shelling, MD, [facility] 85/[id number]/9311, 2:16 PM Certified in Neurology, Clinical Neurophysiology, Sleep Medicine and Neuroimaging  [facility] [address] [place], [place] [address] [phone]

## 2021-12-12 ENCOUNTER — Ambulatory Visit: Payer: Medicare Other | Admitting: Nurse Practitioner

## 2022-01-06 ENCOUNTER — Other Ambulatory Visit: Payer: Self-pay

## 2022-01-06 ENCOUNTER — Ambulatory Visit
Admission: RE | Admit: 2022-01-06 | Discharge: 2022-01-06 | Disposition: A | Discharge status: Home / Self Care | Payer: Medicare Other | Source: Ambulatory Visit | Attending: Family Medicine | Admitting: Family Medicine

## 2022-01-06 DIAGNOSIS — M8580 Other specified disorders of bone density and structure, unspecified site: Secondary | ICD-10-CM

## 2022-01-06 DIAGNOSIS — Z1231 Encounter for screening mammogram for malignant neoplasm of breast: Secondary | ICD-10-CM

## 2022-01-06 IMAGING — MG MM DIGITAL SCREENING BILAT W/ TOMO AND CAD
8 of 14 series · 8 of 40 positions shown · non-contrast
Comparison: Previous exam(s).

CLINICAL DATA: Screening.

EXAM:
DIGITAL SCREENING BILATERAL MAMMOGRAM WITH TOMOSYNTHESIS AND CAD
TECHNIQUE: Bilateral screening digital craniocaudal and mediolateral oblique
mammograms were obtained. Bilateral screening digital breast
tomosynthesis was performed. The images were evaluated with
computer-aided detection.

[R CC synth-2D (1 of 2)]
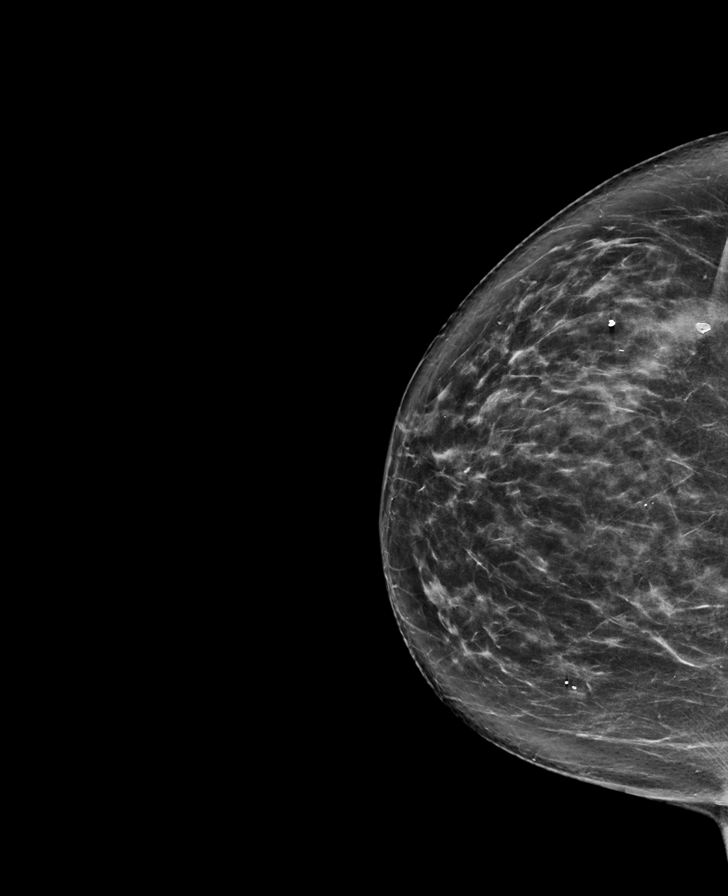

[L CC synth-2D (1 of 2)]
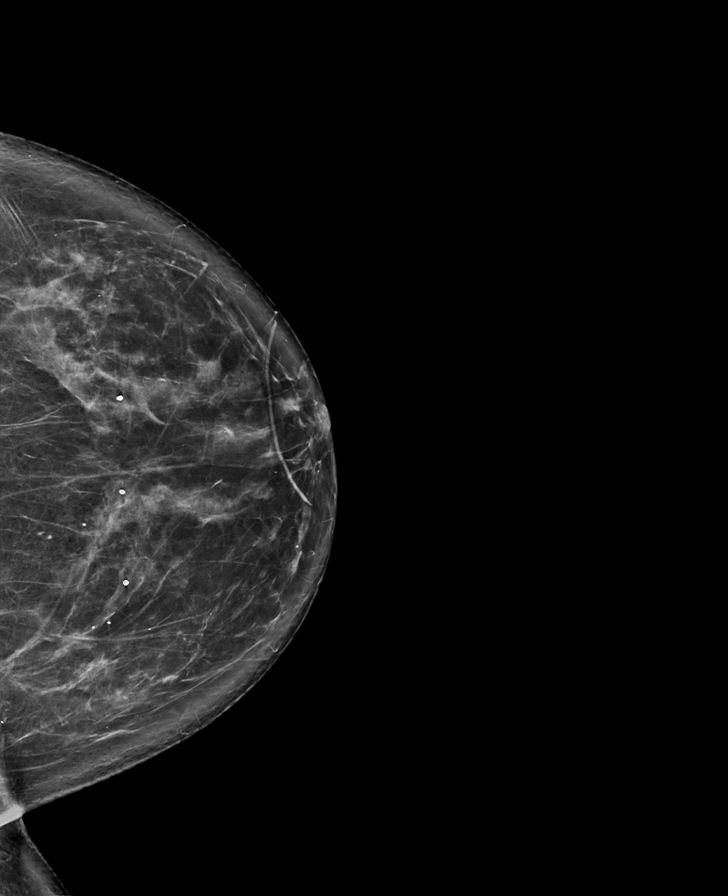

[R MLO synth-2D]
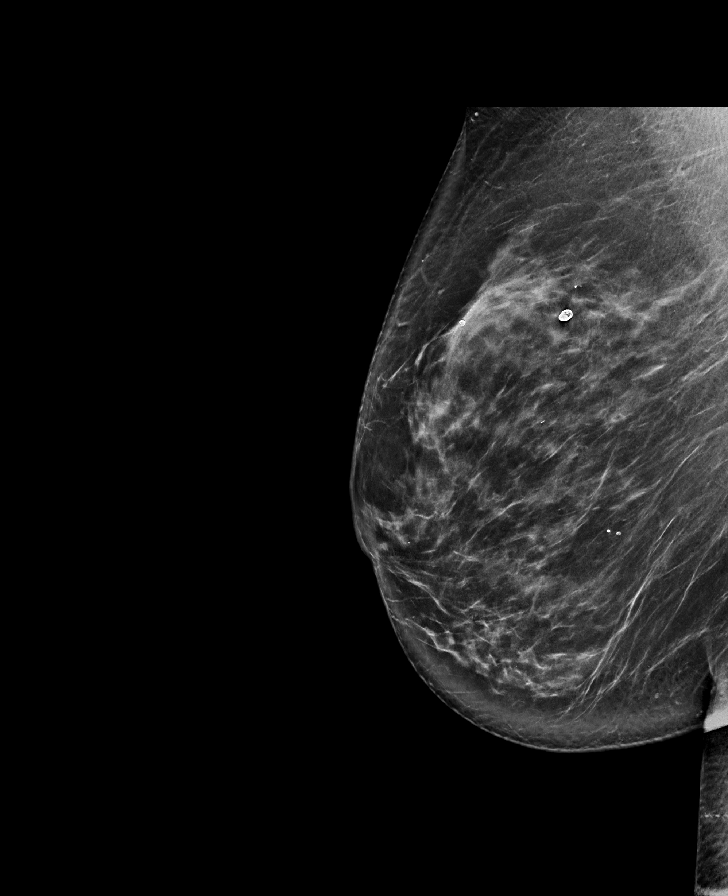

[L ML synth-2D]
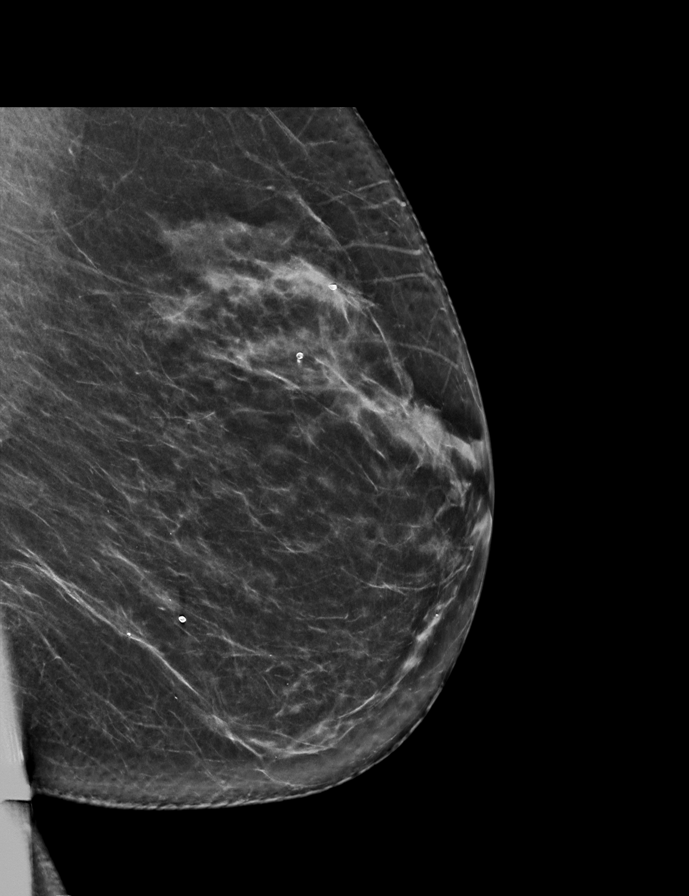

[R CC synth-2D (2 of 2)]
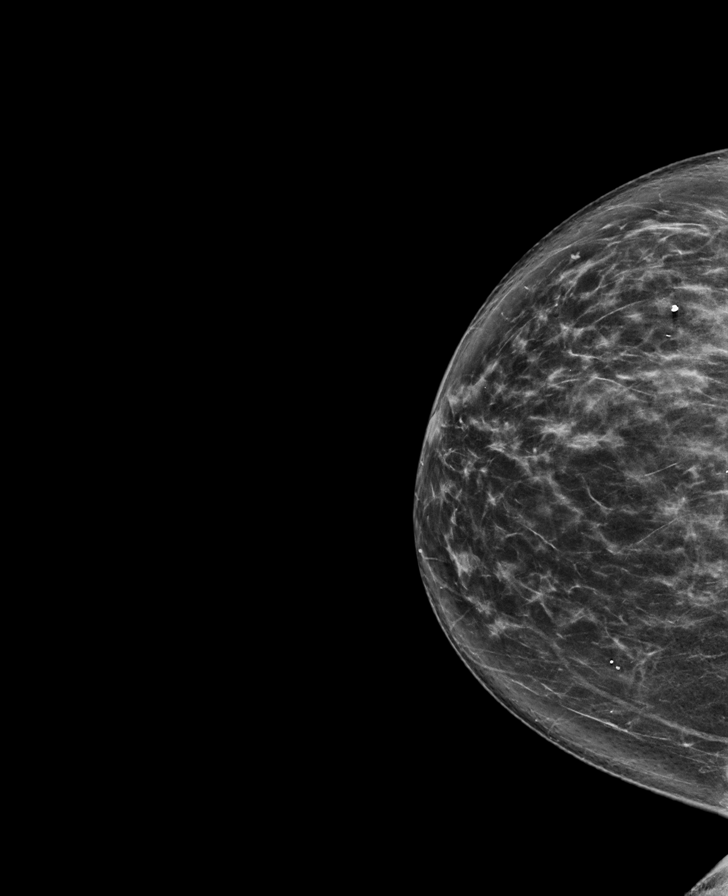

[L CC synth-2D (2 of 2)]
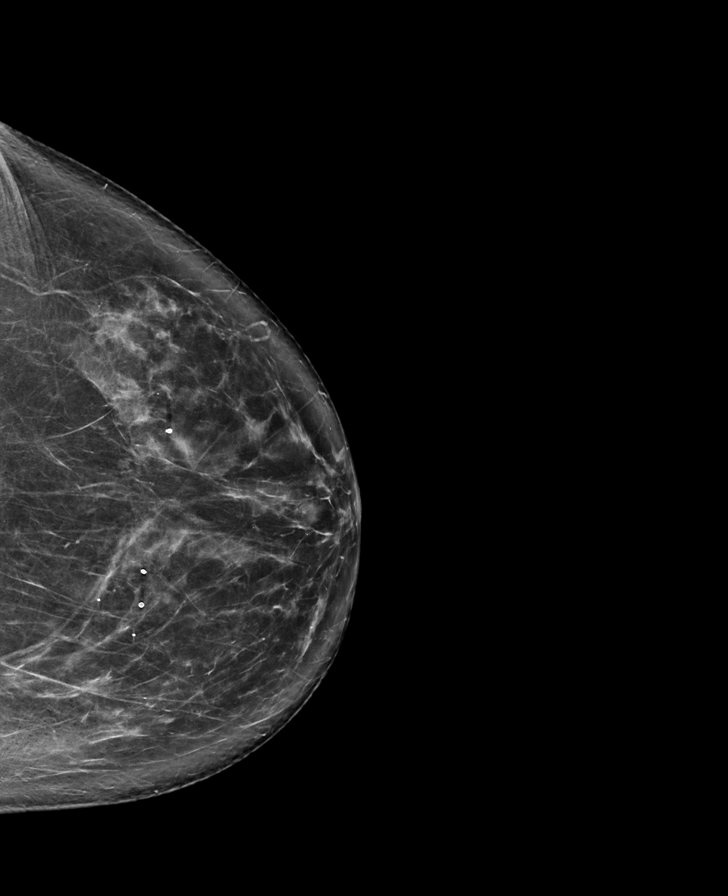

[L MLO synth-2D]
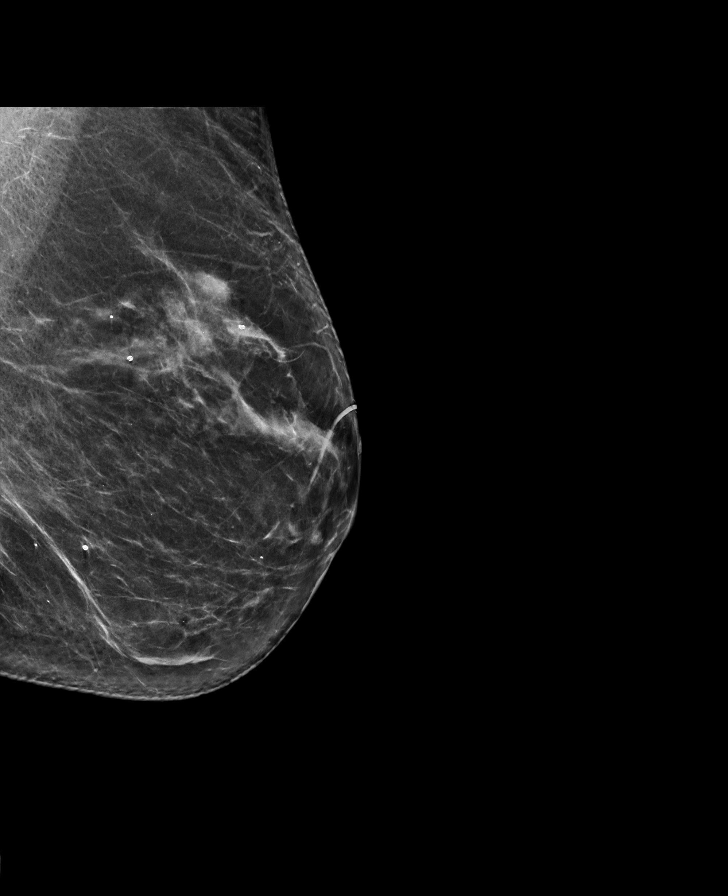

[R CC tomo · tomo slice 43/85.0]
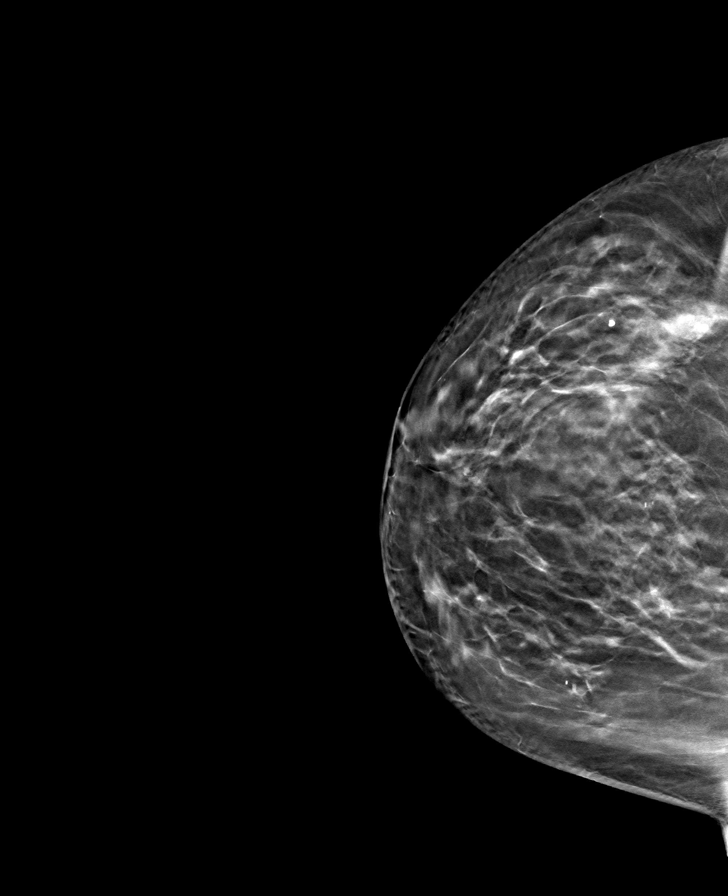

[8 of 40 positions shown; findings below may reference images not displayed]

ACR Breast Density Category c: The breast tissue is heterogeneously
dense, which may obscure small masses.
FINDINGS: There are no findings suspicious for malignancy.
IMPRESSION: No mammographic evidence of malignancy. A result letter of this
screening mammogram will be mailed directly to the patient.

RECOMMENDATION:
Screening mammogram in one year. (Code:[V2])

BI-RADS CATEGORY  1: Negative.

## 2022-04-17 ENCOUNTER — Emergency Department (HOSPITAL_BASED_OUTPATIENT_CLINIC_OR_DEPARTMENT_OTHER)
Admission: EM | Admit: 2022-04-17 | Discharge: 2022-04-17 | Disposition: A | Discharge status: Home / Self Care | Payer: Medicare Other | Attending: Emergency Medicine | Admitting: Emergency Medicine

## 2022-04-17 ENCOUNTER — Encounter (HOSPITAL_BASED_OUTPATIENT_CLINIC_OR_DEPARTMENT_OTHER): Payer: Self-pay | Admitting: *Deleted

## 2022-04-17 ENCOUNTER — Other Ambulatory Visit: Payer: Self-pay

## 2022-04-17 DIAGNOSIS — S61252A Open bite of right middle finger without damage to nail, initial encounter: Secondary | ICD-10-CM | POA: Diagnosis not present

## 2022-04-17 DIAGNOSIS — W5321XA Bitten by squirrel, initial encounter: Secondary | ICD-10-CM | POA: Insufficient documentation

## 2022-04-17 DIAGNOSIS — E119 Type 2 diabetes mellitus without complications: Secondary | ICD-10-CM | POA: Diagnosis not present

## 2022-04-17 DIAGNOSIS — Z79899 Other long term (current) drug therapy: Secondary | ICD-10-CM | POA: Insufficient documentation

## 2022-04-17 DIAGNOSIS — Z23 Encounter for immunization: Secondary | ICD-10-CM | POA: Diagnosis not present

## 2022-04-17 DIAGNOSIS — I10 Essential (primary) hypertension: Secondary | ICD-10-CM | POA: Diagnosis not present

## 2022-04-17 MED ORDER — AMOXICILLIN-POT CLAVULANATE 875-125 MG PO TABS: 1.0000 | ORAL_TABLET | Freq: Two times a day (BID) | ORAL | 0 refills | Status: AC

## 2022-04-17 MED ORDER — TETANUS-DIPHTH-ACELL PERTUSSIS 5-2.5-18.5 LF-MCG/0.5 IM SUSY
0.5000 mL | PREFILLED_SYRINGE | Freq: Once | INTRAMUSCULAR | Status: AC
  Administered 2022-04-17: 0.5 mL via INTRAMUSCULAR
  Filled 2022-04-17: qty 0.5

## 2022-04-17 NOTE — ED Provider Notes (Signed)
  [facility] DEPT Provider Note   CSN: [id number] Arrival date & time: 04/17/22  1929     History {Add pertinent medical, surgical, social history, OB history to HPI:1} Chief Complaint  Patient presents with   Animal Bite    Monica Ochoa is a 76 y.o. female.  HPI     Home Medications Prior to Admission medications   Medication Sig Start Date End Date Taking? Authorizing Provider  atorvastatin (LIPITOR) 10 MG tablet Take 10 mg by mouth every evening.    [provider]  calcium carbonate (OS-CAL) 600 MG TABS Take 600 mg by mouth 2 (two) times daily with a meal.    [provider]  carboxymethylcellulose (REFRESH PLUS) 0.5 % SOLN Place 1 drop into both eyes daily as needed (dry eyes).    [provider]  cholecalciferol (VITAMIN D) 1000 units tablet Take 1,000 Units by mouth daily.    [provider]  Co-Enzyme Q-10 100 MG CAPS Take 100 mg by mouth daily.    [provider]  escitalopram (LEXAPRO) 10 MG tablet Take 10 mg by mouth daily. 09/04/21   [provider]  Flaxseed, Linseed, (FLAXSEED OIL) 1000 MG CAPS Take 1,000 mg by mouth daily.    [provider]  fluticasone (FLONASE) 50 MCG/ACT nasal spray Place 1 spray into both nostrils daily as needed for allergies or rhinitis.    [provider]  losartan (COZAAR) 25 MG tablet Take 25 mg by mouth daily.    [provider]  ondansetron (ZOFRAN) 4 MG tablet Take 1 tablet (4 mg total) by mouth every 8 (eight) hours as needed for nausea or vomiting. 01/01/21   Viona Gilmore D, NP  Probiotic Product (PROBIOTIC COLON SUPPORT PO) Take 1 capsule by mouth every morning.    [provider]  zonisamide (ZONEGRAN) 25 MG capsule One or two at night 11/14/21   Sater, Nanine Means, MD      Allergies    Codeine    Review of Systems   Review of Systems  Physical Exam Updated Vital Signs BP (!) 165/76 (BP Location: Right Arm)    Pulse 76   Temp 98.1 F (36.7 C)   Resp 17   Wt 61.2 kg   SpO2 96%   BMI 22.47 kg/m  Physical Exam  ED Results / Procedures / Treatments   Labs (all labs ordered are listed, but only abnormal results are displayed) Labs Reviewed - No data to display  EKG None  Radiology No results found.  Procedures Procedures  {Document cardiac monitor, telemetry assessment procedure when appropriate:1}  Medications Ordered in ED Medications - No data to display  ED Course/ Medical Decision Making/ A&P                           Medical Decision Making  ***  {Document critical care time when appropriate:1} {Document review of labs and clinical decision tools ie heart score, Chads2Vasc2 etc:1}  {Document your independent review of radiology images, and any outside records:1} {Document your discussion with family members, caretakers, and with consultants:1} {Document social determinants of health affecting pt's care:1} {Document your decision making why or why not admission, treatments were needed:1} Final Clinical Impression(s) / ED Diagnoses Final diagnoses:  None    Rx / DC Orders ED Discharge Orders     None

## 2022-04-17 NOTE — ED Notes (Signed)
Pt instructed to wait 11mns following vaccination.

## 2022-04-17 NOTE — ED Triage Notes (Signed)
Pt was feeding a squirrel when it bit her right middle finger.  Pt has been feeding this squirrel in her back yard for the last 2 years and it is not an aggressive squirrel.

## 2022-05-22 ENCOUNTER — Ambulatory Visit: Payer: Medicare Other | Admitting: Neurology

## 2022-10-16 ENCOUNTER — Ambulatory Visit: Payer: Medicare Other | Admitting: Neurology

## 2022-10-16 ENCOUNTER — Encounter: Payer: Self-pay | Admitting: Neurology

## 2022-10-16 VITALS — BP 157/80 | HR 79 | Ht 63.0 in | Wt 140.2 lb

## 2022-10-16 DIAGNOSIS — G4489 Other headache syndrome: Secondary | ICD-10-CM | POA: Diagnosis not present

## 2022-10-16 DIAGNOSIS — R9082 White matter disease, unspecified: Secondary | ICD-10-CM

## 2022-10-16 DIAGNOSIS — R930 Abnormal findings on diagnostic imaging of skull and head, not elsewhere classified: Secondary | ICD-10-CM

## 2022-10-16 DIAGNOSIS — G4752 REM sleep behavior disorder: Secondary | ICD-10-CM

## 2022-10-16 MED ORDER — CLONAZEPAM 0.5 MG PO TABS: ORAL_TABLET | ORAL | 5 refills | Status: AC

## 2022-10-16 NOTE — Progress Notes (Signed)
[facility]  PATIENT: Monica Ochoa PATIENT DOB: 11-28-1945  REFERRING DOCTOR OR PCP: Caren Macadam (PCP) SOURCE: Patient, notes from primary care, notes from Dr. Tomi Likens and ED.  Also reviewed imaging reports, lab reports.  MRI images personally reviewed.  _________________________________   HISTORICAL  CHIEF COMPLAINT:  Chief Complaint  Patient presents with   Follow-up    Pt in 1 with husband  Pt states when she leans forward   she becomes off balance ,feels heaviness in forehead and has brain fog . Pt states when reading she has same symptoms Pt states her glasses hurt her head at times     HISTORY OF PRESENT ILLNESS:  Monica Ochoa is a 76 y.o. woman with headaches, neurologic symptoms, abnormal brain MRI and concern about multiple sclerosis.  UPDATE 10/16/2022: She denies much headaches that are painful but gets a pressure sensation when she bends forward or looking down (I.I reading or writing).    She feels a little nauseous.    She never took the zonisamide.         She has active dreams/nightmare usually she is lost or being attacked many nights but they are less vivid since the Lexapro was stopped.   Melatonin worsened it a bit and she stopped.  .  She screams out and sometimes punches or kicks.  These are now 5 nights a week/  She rolled out of bed once.    These are almost only in the mornings not the first coupe ours of sleep.     She snores a bit but not throughout the night.   She is sometimes sleepy but does not nap.     I have reviewed her last 2 brain MRI and compared it to the one from earlier in the year.  The 2 small enhancing foci are unchanged.  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  Neither of these should be symptomatic.  She also has some scattered T2/FLAIR hyperintense foci consistent with mild chronic  microvascular ischemic change.   MRis are not c/w MS.    History:  She is a 76 year old woman who had dizziness starting November 2021.   She saw her PCP and was treated for vertigo but she did not improve.   She also had nausea with the vertigo.   She felt wobbly with hr walking.    In January, she had elevated Bp and noted tingling all over.   She had MRI of the bran and cervical spine.    It showed spinal stenosis at C5C6 and C6-C7 and spots in the brain including one that enhanced.   She also noted some visual changes.     She felt better a couple weeks later.    She had ACDF C5-C7 (Dr. Annette Stable).   After the surgery, she felt better an her balance was better.   In early April, she had a return of her symptoms, with moreeintensity than earlier.    She also felt very fatigued.    Poor balance, nausea and reduced vision all returned.   She also had dysesthesias and allodynia on both sides of her scalp.    She returned to the ED 02/28/2021 after two episodes where she felt she couldn't move.   She had repeat brain and spine imaging.    She was referrred to Dr. Tomi Likens and had an  LP.      There were no oligoclonal bands (she did have 2 paired CSF/serum bands) and IgG index  She has a bilateral HA in her forehead, worse if she bends forward or if she is in flickering lights.    HA's were worse in APril and stated to improve in May.   They are still occurring.   Heat o her forehead helps.      Of note, she had her booster shot for Covid in November about 2 weeks before she had dizzinesss.    Imaging  MRI of the brain 11/21/2020 shows some scattered T2/FLAIR hyperintense foci in the hemispheres.  1 focus in the right parietal lobe and another focus in the left posterior frontal lobe have subtle enhancement after contrast was administered.  MRI of the brain 02/28/2021 is unchanged compared to the 11/21/2020 MRI.  Specifically, the 2 foci of enhancement were still present.  MRI of the cervical spine 11/21/2020 shows  multilevel degenerative changes most advanced at C5-C6 and C6-C7 where there is spinal stenosis and biforaminal narrowing..  The spinal cord appears normal.  MRI of the cervical spine 03/27/2021 shows ACDF at C5-C7.  The spinal cord appears normal.  MRI of the brain 10/23/2021 was unchanged.  There are scattered T2/FLAIR hyperintense foci in the hemispheres consistent with chronic microvascular ischemic change, unchanged compared to the 02/28/2021 MRI.     There are 2 enhancing foci, 1 in each hemisphere.  Both of these foci were present on the MRI from 02/28/2021.  The focus on the right also has some linear component to it would be most consistent with a small developmental venous anomaly.  The focus on the left could represent a small capillary telangiectasia.  REVIEW OF SYSTEMS: Constitutional: No fevers, chills, sweats, or change in appetite Eyes: No visual changes, double vision, eye pain Ear, nose and throat: No hearing loss, ear pain, nasal congestion, sore throat Cardiovascular: No chest pain, palpitations Respiratory:  No shortness of breath at rest or with exertion.   No wheezes GastrointestinaI: No nausea, vomiting, diarrhea, abdominal pain, fecal incontinence Genitourinary:  No dysuria, urinary retention or frequency.  No nocturia. Musculoskeletal:  No neck pain, back pain Integumentary: No rash, pruritus, skin lesions Neurological: as above Psychiatric: No depression at this time.  No anxiety Endocrine: No palpitations, diaphoresis, change in appetite, change in weigh or increased thirst Hematologic/Lymphatic:  No anemia, purpura, petechiae. Allergic/Immunologic: No itchy/runny eyes, nasal congestion, recent allergic reactions, rashes  ALLERGIES: Allergies  Allergen Reactions   Codeine Palpitations    "makes heart palpitate"    HOME MEDICATIONS:  Current Outpatient Medications:    atorvastatin (LIPITOR) 10 MG tablet, Take 10 mg by mouth every evening., Disp: , Rfl:     calcium carbonate (OS-CAL) 600 MG TABS, Take 600 mg by mouth 2 (two) times daily with a meal., Disp: , Rfl:    carboxymethylcellulose (REFRESH PLUS) 0.5 % SOLN, Place 1 drop into both eyes daily as needed (dry eyes)., Disp: , Rfl:    cholecalciferol (VITAMIN D) 1000 units tablet, Take 1,000 Units by mouth daily., Disp: , Rfl:    clonazePAM (KLONOPIN) 0.5 MG tablet, 1/2 to 1 po qHS, Disp: 30 tablet, Rfl: 5   Co-Enzyme Q-10 100 MG CAPS, Take 100 mg by mouth daily., Disp: , Rfl:    Flaxseed, Linseed, (FLAXSEED OIL) 1000 MG CAPS, Take 1,000 mg by mouth daily., Disp: , Rfl:    fluticasone (FLONASE) 50 MCG/ACT nasal spray, Place 1 spray into  both nostrils daily as needed for allergies or rhinitis., Disp: , Rfl:    losartan (COZAAR) 25 MG tablet, Take 25 mg by mouth daily., Disp: , Rfl:    Probiotic Product (PROBIOTIC COLON SUPPORT PO), Take 1 capsule by mouth every morning., Disp: , Rfl:   PAST MEDICAL HISTORY: Past Medical History:  Diagnosis Date   Diabetes mellitus without complication ([place])    Family history of adverse reaction to anesthesia    Sister has a hard time waking up after anesthesia and post op N/V   Female bladder prolapse    Hyperlipidemia 02/14/2016   Hypertension    Intermittent palpitations    03-23-2018 per pt caused due to anxiety/ upset   Osteopenia 06/2019   T score -2.1 FRAX 13% / 3.3% overall stable from prior DEXA   Palpitations    PVC's (premature ventricular contractions) 03/17/2016   Wears glasses     PAST SURGICAL HISTORY: Past Surgical History:  Procedure Laterality Date   ABDOMINAL HYSTERECTOMY  1977   ANAL SPHINCTEROPLASTY  02/04/2011   WITH PERINEORRHAPHY   ANTERIOR CERVICAL DECOMP/DISCECTOMY FUSION N/A 12/31/2020   Procedure: Anterior Cervical Decompression Fusion  - Cervical five-Cervical six - Cervical six-Cervical seven;  Surgeon: Earnie Larsson, MD;  Location: [facility];  Service: Neurosurgery;  Laterality: N/A;   BREAST EXCISIONAL BIOPSY Left 1989   LUMBAR  SPINE SURGERY  2000   TOTAL VAGINAL HYSTERECTOMY  1977   W/  ANTERIOR AND POSTERIOR REPAIR    FAMILY HISTORY: Family History  Problem Relation Age of Onset   Hypertension Mother    Cervical cancer Mother    Heart disease Mother    Uterine cancer Mother    Angina Mother    Diabetes Father    Other Father        black lung   Hypertension Brother    Diabetes Brother    Hypertension Sister    Heart Problems Sister        valve problems   Crohn's disease Sister    Breast cancer Neg Hx     SOCIAL HISTORY:  Social History   Socioeconomic History   Marital status: Married    Spouse name: Not on file   Number of children: Not on file   Years of education: Not on file   Highest education level: Not on file  Occupational History   Not on file  Tobacco Use   Smoking status: Never   Smokeless tobacco: Never  Vaping Use   Vaping Use: Never used  Substance and Sexual Activity   Alcohol use: No    Alcohol/week: 0.0 standard drinks of alcohol   Drug use: No   Sexual activity: Yes    Birth control/protection: Surgical    Comment: 1st intercourse- 19, partners- 1  Other Topics Concern   Not on file  Social History Narrative   Mostly coffee daily   Right handed   Lives w/ husband   Social Determinants of Health   Financial Resource Strain: Not on file  Food Insecurity: Not on file  Transportation Needs: Not on file  Physical Activity: Not on file  Stress: Not on file  Social Connections: Not on file  Intimate Partner Violence: Not on file     PHYSICAL EXAM  Vitals:   10/16/22 1059  BP: (!) 157/80  Pulse: 79  Weight: 140 lb 3.2 oz (63.6 kg)  Height: '5\' 3"'$  (1.6 m)    Body mass index is 24.84 kg/m.   General: The  patient is well-developed and well-nourished and in no acute distress  HEENT:  Head is [place]/AT.  Sclera are anicteric.    Skin: Extremities are without rash or  edema.  Musculoskeletal:  Back is nontender  Neurologic Exam  Mental status: The  patient is alert and oriented x 3 at the time of the examination. The patient has apparent normal recent and remote memory, with an apparently normal attention span and concentration ability.   Speech is normal.  Cranial nerves: Extraocular movements are full.  Facial strength and sensation was normal.  No obvious hearing deficits are noted.  Motor:  Muscle bulk is normal.   Tone is normal. Strength is  5 / 5 in all 4 extremities.   Sensory: Sensory testing is intact to pinprick, soft touch and vibration sensation  Coordination: Cerebellar testing reveals good finger-nose-finger and heel-to-shin bilaterally.  Gait and station: Station is normal.   Gait is normal. Tandem gait is mildly wide.  There is no retropulsion.. Romberg is negative.   Reflexes: Deep tendon reflexes are symmetric and normal bilaterally.       DIAGNOSTIC DATA (LABS, IMAGING, TESTING) - I reviewed patient records, labs, notes, testing and imaging myself where available.  Lab Results  Component Value Date   WBC 6.7 02/28/2021   HGB 13.6 02/28/2021   HCT 40.5 02/28/2021   MCV 84.9 02/28/2021   PLT 265 02/28/2021      Component Value Date/Time   NA 133 (L) 02/28/2021 0602   K 3.3 (L) 02/28/2021 0602   CL 99 02/28/2021 0602   CO2 24 02/28/2021 0602   GLUCOSE 126 (H) 02/28/2021 0602   BUN 9 02/28/2021 0602   CREATININE 0.78 02/28/2021 0602   CREATININE 0.84 02/14/2016 0845   CALCIUM 9.4 02/28/2021 0602   PROT 7.8 11/18/2020 1817   ALBUMIN 4.7 11/18/2020 1817   AST 29 11/18/2020 1817   ALT 25 11/18/2020 1817   ALKPHOS 72 11/18/2020 1817   BILITOT 0.4 11/18/2020 1817   GFRNONAA >60 02/28/2021 0602   GFRAA >60 03/23/2018 1332   Lab Results  Component Value Date   CHOL 269 (H) 02/14/2016   HDL 65 02/14/2016   LDLCALC 179 (H) 02/14/2016   TRIG 126 02/14/2016   CHOLHDL 4.1 02/14/2016   Lab Results  Component Value Date   HGBA1C 6.1 (H) 12/27/2020   No results found for: "VITAMINB12" Lab Results   Component Value Date   TSH 1.878 02/28/2021       ASSESSMENT AND PLAN  Abnormal MRI of head  Other headache syndrome  White matter abnormality on MRI of brain  REM behavioral disorder   Trial of low dose clonazepam for RBD.  No evidence of PD, LBD or other synucleinopathy on exam, though REM behavior disorder may precede other symptoms for many years.   She has an unusual HA pattern that I do not have a definite diagnosis.  Since there are some migrainous features we can do a trial of zonisamide 25-50 mg (similar to topiramate but better tolerated).   Has not seen ophthalmology x 1 year so recommended to see again since its triggered by reading at times.      The MRI of the brain is stable.  There are 2 small enhancing foci that were read by radiology as being worrisome for active demyelination.  However, the characteristics are not typical for active demyelination and the continued enhancement with stable pattern is much more consistent with a developmental venous anomaly and capillary telangiectasia.  I do not think there is any evidence of MS. Rtc 6 months or sooner if new or worsnening neurologic issues    Isabele Lollar A. Felecia Shelling, MD, [facility] 61/47/0929, 57:47 AM Certified in Neurology, Clinical Neurophysiology, Sleep Medicine and Neuroimaging  [facility] [address], [place], [place] [address] [phone]

## 2022-12-29 ENCOUNTER — Other Ambulatory Visit: Payer: Self-pay | Admitting: Family Medicine

## 2022-12-29 DIAGNOSIS — Z1231 Encounter for screening mammogram for malignant neoplasm of breast: Secondary | ICD-10-CM

## 2023-02-11 ENCOUNTER — Ambulatory Visit: Payer: Medicare Other

## 2023-02-26 ENCOUNTER — Ambulatory Visit
Admission: RE | Admit: 2023-02-26 | Discharge: 2023-02-26 | Disposition: A | Discharge status: Home / Self Care | Payer: Medicare Other | Source: Ambulatory Visit | Attending: Family Medicine | Admitting: Family Medicine

## 2023-02-26 DIAGNOSIS — Z1231 Encounter for screening mammogram for malignant neoplasm of breast: Secondary | ICD-10-CM

## 2023-04-16 ENCOUNTER — Ambulatory Visit: Payer: Medicare Other | Admitting: Neurology

## 2023-04-27 ENCOUNTER — Ambulatory Visit: Payer: Medicare Other | Attending: Neurology | Admitting: Physical Therapy

## 2023-04-27 DIAGNOSIS — R2681 Unsteadiness on feet: Secondary | ICD-10-CM | POA: Diagnosis present

## 2023-04-27 DIAGNOSIS — R42 Dizziness and giddiness: Secondary | ICD-10-CM | POA: Diagnosis present

## 2023-04-27 DIAGNOSIS — R262 Difficulty in walking, not elsewhere classified: Secondary | ICD-10-CM | POA: Diagnosis present

## 2023-04-27 NOTE — Therapy (Unsigned)
OUTPATIENT PHYSICAL THERAPY VESTIBULAR EVALUATION     Patient Name: Monica Ochoa MRN: [id number] DOB:11/08/1946, 77 y.o., female Today's Date: 04/28/2023  END OF SESSION:  PT End of Session - 04/28/23 1856     Visit Number 1    Number of Visits 7    Date for PT Re-Evaluation 06/05/23    Authorization Type [employer] Medicare    Authorization Time Period 04-27-23 - 06-27-23    PT Start Time 1018    PT Stop Time 1102    PT Time Calculation (min) 44 min    Activity Tolerance Patient tolerated treatment well    Behavior During Therapy [facility] for tasks assessed/performed             Past Medical History:  Diagnosis Date   Diabetes mellitus without complication (HCC)    Family history of adverse reaction to anesthesia    Sister has a hard time waking up after anesthesia and post op N/V   Female bladder prolapse    Hyperlipidemia 02/14/2016   Hypertension    Intermittent palpitations    03-23-2018 per pt caused due to anxiety/ upset   Osteopenia 06/2019   T score -2.1 FRAX 13% / 3.3% overall stable from prior DEXA   Palpitations    PVC's (premature ventricular contractions) 03/17/2016   Wears glasses    Past Surgical History:  Procedure Laterality Date   ABDOMINAL HYSTERECTOMY  1977   ANAL SPHINCTEROPLASTY  02/04/2011   WITH PERINEORRHAPHY   ANTERIOR CERVICAL DECOMP/DISCECTOMY FUSION N/A 12/31/2020   Procedure: Anterior Cervical Decompression Fusion  - Cervical five-Cervical six - Cervical six-Cervical seven;  Surgeon: Julio Sicks, MD;  Location: [facility] OR;  Service: Neurosurgery;  Laterality: N/A;   BREAST EXCISIONAL BIOPSY Left 1989   LUMBAR SPINE SURGERY  2000   TOTAL VAGINAL HYSTERECTOMY  1977   W/  ANTERIOR AND POSTERIOR REPAIR   Patient Active Problem List   Diagnosis Date Noted   Abnormal MRI of head 05/09/2021   Balance disorder 05/09/2021   Other headache syndrome 05/09/2021   Vision disturbance 05/09/2021   Cervical myelopathy (HCC) 12/31/2020   Female cystocele  11/06/2020   History of total vaginal hysterectomy (TVH) 11/06/2020   PVC's (premature ventricular contractions) 03/17/2016   Atypical chest pain 02/16/2016   Palpitations 02/14/2016   Hyperlipidemia 02/14/2016   Osteopenia 09/15/2013   Vaginal atrophy 04/20/2012   Fecal incontinence 04/20/2012    PCP:  Aliene Beams, MD  REFERRING PROVIDER: Karle Barr, MD  REFERRING DIAG: R42 (ICD-10-CM) - Dizziness Diagnosis  R42 (ICD-10-CM) - Dizziness    THERAPY DIAG:  Dizziness and giddiness  Difficulty in walking, not elsewhere classified  Unsteadiness on feet  ONSET DATE: end of Nov. 2021:  Referral date  Rationale for Evaluation and Treatment: Rehabilitation  SUBJECTIVE:   SUBJECTIVE STATEMENT:    Pt reports she was referred to PT by Dr. Suszanne Conners - not Dr. Everlena Cooper Pt reports imbalance and dizziness started in Nov. 2021 - right after she had gotten the Covid booster; says this is the only thing that she can relate to this episode. Went to ED twice because she thought she had a stroke; had cervical MRI -found to have cervical myelopathy and did well after the surgery.  Pt reports she has noise sensitivity, imbalance and visual motion sensitivity with inability to focus on scrolling and intolerance to cold and heat.  Dr. Epimenio Foot said he could not find anything that would cause her symptoms; pt says she saw Dr. Suszanne Conners  3 times in May 2024; had testing and she says that he is the one who referred her to PT.  Says she saw neuro- optometrist in [place] who did not find anything (Dr. Lorina Rabon); says she is no longer able read and comprehend.  Feels swimmy-headed with bending over - gets relief with sitting back in recliner and having her head back.  Pt reports she used to have vivid nightmares but they have improved with medication.  Gets disoriented in stores ("bad in [employer]")  Pt accompanied by:  husband, Rosanne Ashing  PERTINENT HISTORY: cervical myelopathy with surgery (ACDF)  - 12-31-20; HTN,  intermittent palpitations, osteopenia, referred to Dr. Epimenio Foot by Dr. Everlena Cooper to rule out MS, h/o visual disturbance, other headache syndrome  PAIN:  Are you having pain?  Occasional fullness in forehead at times - not present at this time  PRECAUTIONS: None  WEIGHT BEARING RESTRICTIONS: No  FALLS: Has patient fallen in last 6 months? No  LIVING ENVIRONMENT: Lives with: lives with their spouse Lives in: House/apartment Stairs: Yes: Internal: 3 steps; none Has following equipment at home: walking stick - carries it when she walks about a mile - 4-5x/week  PLOF: Independent and pt states she doesn't drive long distances  PATIENT GOALS: improve balance - improve sensitivity to bright lights if possible  OBJECTIVE:   DIAGNOSTIC FINDINGS: White matter abnormality of the brain - nonspecific pattern.  Two small enhancing foci over 3 month period unlikely to be MS.  Labs for sarcoid and vasculitis negative.  Possibly may represent small developmental venous anomalies.  At this point, no clear diagnosis Dizziness - Unrelated to MRI findings.  I have no clear diagnosis    COGNITION: Overall cognitive status: Within functional limits for tasks assessed   SENSATION: WFL  POSTURE:  No Significant postural limitations  Cervical ROM:  WFL's  STRENGTH: WNL's  GAIT: Gait pattern: WFL Distance walked: 100' Assistive device utilized: None Level of assistance: Modified independence Comments: No  FUNCTIONAL TESTS:  SOT to be completed next session  PATIENT SURVEYS:  FOTO DPS 50/100; risk adjusted 51/100;  DFS 49.3  VESTIBULAR ASSESSMENT:  GENERAL OBSERVATION: Pt is a 77 yr old lady with c/o dizziness, noise and light sensitivity and imbalance that started in late Nov. 2021 after receiving Covid booster shot.  Pt reports she was evaluated by Everlena Cooper, who referred her to Dr. Epimenio Foot to rule out MS (was ruled out).  Per his chart notes he found no etiology of the dizziness.  Pt states she was  evaluated by Dr. Suszanne Conners in May 2024 - per her report of his explanation of the exam, pt appears to have vestibular hypofunction. Pt states she has much improved since onset 2.5 years ago.  Is bothered by busy visual environments - [employer], [employer], etc.    SYMPTOM BEHAVIOR:  Subjective history: pt reports dizziness started end of Nov. 2021 after getting Covid booster - no known etiology - pt may have vestibular hypofunction per Dr. Avel Sensor findings  Non-Vestibular symptoms: changes in vision, tinnitus, and nausea/vomiting  Type of dizziness: Imbalance (Disequilibrium), Unsteady with head/body turns, Lightheadedness/Faint, and "Swimmyheaded"  Frequency: constant - varies in intensity  Duration:  varies  Aggravating factors: Induced by motion: bending down to the ground, turning body quickly, and turning head quickly  Relieving factors: head stationary and leaning head back in recliner  Progression of symptoms: better  OCULOMOTOR EXAM:  Ocular Alignment: normal  Ocular ROM: No Limitations  Spontaneous Nystagmus: absent  Gaze-Induced Nystagmus: absent  Smooth Pursuits: intact  Saccades: intact and c/o mild increase in dizziness with vertical saccades   VESTIBULAR - OCULAR REFLEX:    Head-Impulse Test: HIT Right: positive  Dynamic Visual Acuity: Static: line 9 Dynamic: line 7     MOTION SENSITIVITY:  Motion Sensitivity Quotient Intensity: 0 = none, 1 = Lightheaded, 2 = Mild, 3 = Moderate, 4 = Severe, 5 = Vomiting  Intensity  1. Sitting to supine   2. Supine to L side   3. Supine to R side   4. Supine to sitting   5. L Hallpike-Dix   6. Up from L    7. R Hallpike-Dix   8. Up from R    9. Sitting, head tipped to L knee   10. Head up from L knee   11. Sitting, head tipped to R knee   12. Head up from R knee   13. Sitting head turns x5   14.Sitting head nods x5   15. In stance, 180 turn to L    16. In stance, 180 turn to R    FUNCTIONAL GAIT:  FGA to be assessed   VESTIBULAR  TREATMENT:                                                                                                   DATE: Initial eval only   PATIENT EDUCATION: Education details: eval results - POC and LTG's Person educated: Patient and Spouse Education method: Explanation Education comprehension: verbalized understanding  HOME EXERCISE PROGRAM:  to be established  GOALS: Goals reviewed with patient? Yes  SHORT TERM GOALS: Target date: 05-15-23  Complete SOT and establish LTG. Baseline: Goal status: INITIAL  2.  Independent in HEP for balance/vestibular exercises. Baseline:  Goal status: INITIAL  3.  Pt will report improved ability with shopping/visual scanning in busy store.  Baseline:  Goal status: INITIAL  4.  Pt will perform x1 viewing exercise for 60 secs horizontal and vertical with minimal c/o dizziness. Baseline: 30 secs each direction Goal status: INITIAL   LONG TERM GOALS: Target date: 06-05-23  Improve all inputs on SOT (somatosensory, visual and vestibular) to WNL's. Baseline:  Goal status: INITIAL  2.  Increase FGA score by at least 4 points to increase safety with gait. Baseline:  Goal status: INITIAL  3.  Improve FOTO DPS score to >/=58/100 to demo improvement in dizziness and mobility.  Baseline: DPS 50;  DFS 49.3 Goal status: INITIAL  4.  Improve DHI by at least 10 points to demo improvement in dizziness and quality of life. Baseline:  Goal status: INITIAL  5.  Independent in updated HEP as appropriate.  Baseline:  Goal status: INITIAL  ASSESSMENT:  CLINICAL IMPRESSION: Patient is a 77 y.o. lady who was seen today for physical therapy evaluation and treatment for dizziness and imbalance.  Symptoms consistent with vestibular hypofunction.  Pt has dizziness with quick head movements and with bending over and returning to upright position.  Pt 's DVA is WNL's with a 2 line difference with pt reporting increased dizziness upon completion of test.  Pt  will benefit from PT to address balance and vestibular deficits including dizziness and gaze stabilization.   OBJECTIVE IMPAIRMENTS: decreased balance, dizziness, and vision and noise sensitivity .   ACTIVITY LIMITATIONS: bending, reach over head, and locomotion level  PARTICIPATION LIMITATIONS: meal prep, cleaning, driving, shopping, community activity, and occupation  PERSONAL FACTORS: Time since onset of injury/illness/exacerbation and 1 comorbidity: cervical myelopathy  are also affecting patient's functional outcome.   REHAB POTENTIAL: Good  CLINICAL DECISION MAKING: Evolving/moderate complexity  EVALUATION COMPLEXITY: Moderate   PLAN:  PT FREQUENCY: 2x/week x 1 week, then 1x/week x 5 weeks  PT DURATION: 6 weeks  PLANNED INTERVENTIONS: Therapeutic exercises, Therapeutic activity, Neuromuscular re-education, Balance training, Gait training, Patient/Family education, Self Care, Stair training, and Vestibular training  PLAN FOR NEXT SESSION: Do SOT; issue HEP - balance on foam and gaze stabilization   Renardo Cheatum, Donavan Burnet, PT 04/28/2023, 7:27 PM

## 2023-04-28 ENCOUNTER — Ambulatory Visit: Payer: Medicare Other | Admitting: Physical Therapy

## 2023-04-28 ENCOUNTER — Encounter: Payer: Self-pay | Admitting: Physical Therapy

## 2023-04-28 DIAGNOSIS — R42 Dizziness and giddiness: Secondary | ICD-10-CM

## 2023-04-28 DIAGNOSIS — R2681 Unsteadiness on feet: Secondary | ICD-10-CM

## 2023-04-28 NOTE — Patient Instructions (Signed)
  Feet Apart (Compliant Surface) Head Motion - Eyes Closed    Stand on compliant surface: _pillow_______ with feet shoulder width apart. Close eyes and move head slowly, up and down. Repeat __5__ times per session. Do _1-2___ sessions per day.  Copyright  VHI. All rights reserved.    Tip Card  1.The goal of habituation training is to assist in decreasing symptoms of vertigo, dizziness, or nausea provoked by specific head and body motions. 2.These exercises may initially increase symptoms; however, be persistent and work through symptoms. With repetition and time, the exercises will assist in reducing or eliminating symptoms. 3.Exercises should be stopped and discussed with the therapist if you experience any of the following: - Sudden change or fluctuation in hearing - New onset of ringing in the ears, or increase in current intensity - Any fluid discharge from the ear - Severe pain in neck or back - Extreme nausea  Copyright  VHI. All rights reserved.  Gaze Stabilization: Tip Card  1.Target must remain in focus, not blurry, and appear stationary while head is in motion. 2.Perform exercises with small head movements (45 to either side of midline). 3.Increase speed of head motion so long as target is in focus. 4.If you wear eyeglasses, be sure you can see target through lens (therapist will give specific instructions for bifocal / progressive lenses). 5.These exercises may provoke dizziness or nausea. Work through these symptoms. If too dizzy, slow head movement slightly. Rest between each exercise. 6.Exercises demand concentration; avoid distractions. 7.For safety, perform standing exercises close to a counter, wall, corner, or next to someone.  Copyright  VHI. All rights reserved.    Gaze Stabilization: Standing Feet Apart    Feet shoulder width apart, keeping eyes on target on wall __5-6__ feet away, tilt head down 15-30 and move head side to side for _60___ seconds. Repeat  while moving head up and down for _60___ seconds. Do __3-5__ sessions per day. Repeat using target on pattern background.  Copyright  VHI. All rights reserved.

## 2023-04-29 ENCOUNTER — Encounter: Payer: Self-pay | Admitting: Physical Therapy

## 2023-04-29 NOTE — Therapy (Signed)
OUTPATIENT PHYSICAL THERAPY VESTIBULAR TREATMENT NOTE     Patient Name: Monica Ochoa MRN: [id number] DOB:05-Sep-1946, 77 y.o., female Today's Date: 04/29/2023  END OF SESSION:  PT End of Session - 04/29/23 1533     Visit Number 2    Number of Visits 7    Date for PT Re-Evaluation 06/05/23    Authorization Type [employer] Medicare    Authorization Time Period 04-27-23 - 06-27-23    PT Start Time 1450    PT Stop Time 1532    PT Time Calculation (min) 42 min    Equipment Utilized During Treatment Other (comment)   harness vest used with SOT on balance master   Activity Tolerance Patient tolerated treatment well    Behavior During Therapy WFL for tasks assessed/performed             Past Medical History:  Diagnosis Date   Diabetes mellitus without complication (HCC)    Family history of adverse reaction to anesthesia    Sister has a hard time waking up after anesthesia and post op N/V   Female bladder prolapse    Hyperlipidemia 02/14/2016   Hypertension    Intermittent palpitations    03-23-2018 per pt caused due to anxiety/ upset   Osteopenia 06/2019   T score -2.1 FRAX 13% / 3.3% overall stable from prior DEXA   Palpitations    PVC's (premature ventricular contractions) 03/17/2016   Wears glasses    Past Surgical History:  Procedure Laterality Date   ABDOMINAL HYSTERECTOMY  1977   ANAL SPHINCTEROPLASTY  02/04/2011   WITH PERINEORRHAPHY   ANTERIOR CERVICAL DECOMP/DISCECTOMY FUSION N/A 12/31/2020   Procedure: Anterior Cervical Decompression Fusion  - Cervical five-Cervical six - Cervical six-Cervical seven;  Surgeon: Julio Sicks, MD;  Location: [facility] OR;  Service: Neurosurgery;  Laterality: N/A;   BREAST EXCISIONAL BIOPSY Left 1989   LUMBAR SPINE SURGERY  2000   TOTAL VAGINAL HYSTERECTOMY  1977   W/  ANTERIOR AND POSTERIOR REPAIR   Patient Active Problem List   Diagnosis Date Noted   Abnormal MRI of head 05/09/2021   Balance disorder 05/09/2021   Other headache syndrome  05/09/2021   Vision disturbance 05/09/2021   Cervical myelopathy (HCC) 12/31/2020   Female cystocele 11/06/2020   History of total vaginal hysterectomy (TVH) 11/06/2020   PVC's (premature ventricular contractions) 03/17/2016   Atypical chest pain 02/16/2016   Palpitations 02/14/2016   Hyperlipidemia 02/14/2016   Osteopenia 09/15/2013   Vaginal atrophy 04/20/2012   Fecal incontinence 04/20/2012    PCP:  Aliene Beams, MD  REFERRING PROVIDER: Karle Barr, MD  REFERRING DIAG: R42 (ICD-10-CM) - Dizziness Diagnosis  R42 (ICD-10-CM) - Dizziness    THERAPY DIAG:  Unsteadiness on feet  Dizziness and giddiness  ONSET DATE: end of Nov. 2021:  Referral date  Rationale for Evaluation and Treatment: Rehabilitation  SUBJECTIVE:   SUBJECTIVE STATEMENT:    Pt reports she went to [employer] today - got very disoriented when she walked into the store - stayed for approx. 30"  - states the disorientation happened at the beginning when she walked in; pt alone today - drove herself to PT  Pt accompanied by:  self  PERTINENT HISTORY: cervical myelopathy with surgery (ACDF)  - 12-31-20; HTN, intermittent palpitations, osteopenia, referred to Dr. Epimenio Foot by Dr. Everlena Cooper to rule out MS, h/o visual disturbance, other headache syndrome  PAIN:  Are you having pain?  Occasional fullness in forehead at times - not present at this time  PRECAUTIONS: None  WEIGHT BEARING RESTRICTIONS: No  FALLS: Has patient fallen in last 6 months? No  LIVING ENVIRONMENT: Lives with: lives with their spouse Lives in: House/apartment Stairs: Yes: Internal: 3 steps; none Has following equipment at home: walking stick - carries it when she walks about a mile - 4-5x/week  PLOF: Independent and pt states she doesn't drive long distances  PATIENT GOALS: improve balance - improve sensitivity to bright lights if possible  OBJECTIVE:  Today's Treatment:  04-28-23  GAIT: Gait pattern: [facility] Distance walked:  100' Assistive device utilized: None Level of assistance: Modified independence Comments: No LOB  Retested DVA for confirmation from eval results -  Dynamic Visual Acuity: Static: line 9 Dynamic: line 7; pt less guarded in today's session with passive cervical rotation during testing (SAME RESULTS AS AT TIME OF INITIAL EVAL)    Pt performed x1 viewing exercise - in standing - target 5' away on plain background - pt performed horizontal and vertical head turns 30 secs each direction with pt reporting movement of target at approx. 28-30 secs with each direction (issued this ex. For HEP)  Sensory Organization Test:  Composite score 69/100 (WNL's); N= 65/100 Somatosensory WNL's Visual - slightly decreased at 70; N= 73/100 Vestibular - WNL's  Condition 1 - all 3 trials WNL's Condition 2 - all 3 trials WNL's Condition 3 - trial 1 slightly below N:  trials 2 and 3 WNL's Condition 4 - trial 1 decreased: trials 2 & 3 WNL's Condition 5 - trial 1 decreased:  trials 2 & 3 WNL's Condition 6 - trial 1 decreased:  trials 2 & 3 WNL's  COG alignment - pt is biased toward Rt side  Standing Balance: Surface: [employer] Position: Narrow Base of Support Feet Hip Width Apart Completed with: Eyes Open and Eyes Closed; Head Turns x 5 Reps and Head Nods x 5 Reps  (Issued this exercise for HEP)   Feet Apart (Compliant Surface) Head Motion - Eyes Closed    Stand on compliant surface: _pillow_______ with feet shoulder width apart. Close eyes and move head slowly, up and down. Repeat __5__ times per session. Do _1-2___ sessions per day.  Copyright  VHI. All rights reserved.    Tip Card  1.The goal of habituation training is to assist in decreasing symptoms of vertigo, dizziness, or nausea provoked by specific head and body motions. 2.These exercises may initially increase symptoms; however, be persistent and work through symptoms. With repetition and time, the exercises will assist in reducing or  eliminating symptoms. 3.Exercises should be stopped and discussed with the therapist if you experience any of the following: - Sudden change or fluctuation in hearing - New onset of ringing in the ears, or increase in current intensity - Any fluid discharge from the ear - Severe pain in neck or back - Extreme nausea   Gaze Stabilization: Tip Card  1.Target must remain in focus, not blurry, and appear stationary while head is in motion. 2.Perform exercises with small head movements (45 to either side of midline). 3.Increase speed of head motion so long as target is in focus. 4.If you wear eyeglasses, be sure you can see target through lens (therapist will give specific instructions for bifocal / progressive lenses). 5.These exercises may provoke dizziness or nausea. Work through these symptoms. If too dizzy, slow head movement slightly. Rest between each exercise. 6.Exercises demand concentration; avoid distractions. 7.For safety, perform standing exercises close to a counter, wall, corner, or next to someone.  Gaze Stabilization: Standing Feet Apart    Feet shoulder width apart, keeping eyes on target on wall __5-6__ feet away, tilt head down 15-30 and move head side to side for _60___ seconds. Repeat while moving head up and down for _60___ seconds. Do __3-5__ sessions per day. Repeat using target on pattern background.  Copyright  VHI. All rights reserved.      PATIENT EDUCATION: Education details: Balance on foam (sheet with 4 positions) EO and EC:  x1 viewing exercise; tip card on habituation with explanation to do activities or movements that provoke symptoms but not to overdo it and go above her threshold; pt was educated in results of SOT and given copy of the test for her records Person educated: Patient  Education method: Explanation, demonstration and handouts; copy of SOT Education comprehension: verbalized understanding, demonstrated understanding  HOME EXERCISE  PROGRAM:  to be established  GOALS: Goals reviewed with patient? Yes  SHORT TERM GOALS: Target date: 05-15-23  Complete SOT and establish LTG. Baseline: Goal status: INITIAL  2.  Independent in HEP for balance/vestibular exercises. Baseline:  Goal status: INITIAL  3.  Pt will report improved ability with shopping/visual scanning in busy store.  Baseline:  Goal status: INITIAL  4.  Pt will perform x1 viewing exercise for 60 secs horizontal and vertical with minimal c/o dizziness. Baseline: 30 secs each direction Goal status: INITIAL   LONG TERM GOALS: Target date: 06-05-23  Improve all inputs on SOT (somatosensory, visual and vestibular) to WNL's. Baseline:  Goal status: INITIAL  2.  Increase FGA score by at least 4 points to increase safety with gait. Baseline:  Goal status: INITIAL  3.  Improve FOTO DPS score to >/=58/100 to demo improvement in dizziness and mobility.  Baseline: DPS 50;  DFS 49.3 Goal status: INITIAL  4.  Improve DHI by at least 10 points to demo improvement in dizziness and quality of life. Baseline:  Goal status: INITIAL  5.  Independent in updated HEP as appropriate.  Baseline:  Goal status: INITIAL  ASSESSMENT:  CLINICAL IMPRESSION: PT session focused on further assessment of vestibular input in balance with pt participating in Sensory Organization Test.  Pt's composite score is WNL's with score 69/100 (N= 65/100):  somatosensory and vestibular inputs are WNL's with visual input slightly decreased at 70/100 (N= 73/100).  Pt's symptoms are consistent with mild vestibular hypofunction.  Pt was issued HEP for vestibular exercises and for gaze stabilization.  Cont with POC.  OBJECTIVE IMPAIRMENTS: decreased balance, dizziness, and vision and noise sensitivity .   ACTIVITY LIMITATIONS: bending, reach over head, and locomotion level  PARTICIPATION LIMITATIONS: meal prep, cleaning, driving, shopping, community activity, and occupation  PERSONAL  FACTORS: Time since onset of injury/illness/exacerbation and 1 comorbidity: cervical myelopathy  are also affecting patient's functional outcome.   REHAB POTENTIAL: Good  CLINICAL DECISION MAKING: Evolving/moderate complexity  EVALUATION COMPLEXITY: Moderate   PLAN:  PT FREQUENCY: 2x/week x 1 week, then 1x/week x 5 weeks  PT DURATION: 6 weeks  PLANNED INTERVENTIONS: Therapeutic exercises, Therapeutic activity, Neuromuscular re-education, Balance training, Gait training, Patient/Family education, Self Care, Stair training, and Vestibular training  PLAN FOR NEXT SESSION:  check HEP for ?'s; do habituation ex - bending over; do DHI   Citlali Gautney, Donavan Burnet, PT 04/29/2023, 3:38 PM

## 2023-05-12 ENCOUNTER — Ambulatory Visit: Payer: Medicare Other | Admitting: Physical Therapy

## 2023-05-12 DIAGNOSIS — R42 Dizziness and giddiness: Secondary | ICD-10-CM | POA: Diagnosis not present

## 2023-05-12 DIAGNOSIS — R2681 Unsteadiness on feet: Secondary | ICD-10-CM

## 2023-05-12 NOTE — Therapy (Signed)
OUTPATIENT PHYSICAL THERAPY VESTIBULAR TREATMENT NOTE     Patient Name: Monica Ochoa MRN: [id number] DOB:07-26-1946, 77 y.o., female Today's Date: 05/13/2023  END OF SESSION:  PT End of Session - 05/13/23 1447     Visit Number 3    Number of Visits 7    Date for PT Re-Evaluation 06/05/23    Authorization Type [employer] Medicare    Authorization Time Period 04-27-23 - 06-27-23    PT Start Time 1315    PT Stop Time 1401    PT Time Calculation (min) 46 min    Equipment Utilized During Treatment --    Activity Tolerance Patient tolerated treatment well    Behavior During Therapy [facility] for tasks assessed/performed              Past Medical History:  Diagnosis Date   Diabetes mellitus without complication (HCC)    Family history of adverse reaction to anesthesia    Sister has a hard time waking up after anesthesia and post op N/V   Female bladder prolapse    Hyperlipidemia 02/14/2016   Hypertension    Intermittent palpitations    03-23-2018 per pt caused due to anxiety/ upset   Osteopenia 06/2019   T score -2.1 FRAX 13% / 3.3% overall stable from prior DEXA   Palpitations    PVC's (premature ventricular contractions) 03/17/2016   Wears glasses    Past Surgical History:  Procedure Laterality Date   ABDOMINAL HYSTERECTOMY  1977   ANAL SPHINCTEROPLASTY  02/04/2011   WITH PERINEORRHAPHY   ANTERIOR CERVICAL DECOMP/DISCECTOMY FUSION N/A 12/31/2020   Procedure: Anterior Cervical Decompression Fusion  - Cervical five-Cervical six - Cervical six-Cervical seven;  Surgeon: Julio Sicks, MD;  Location: [facility] OR;  Service: Neurosurgery;  Laterality: N/A;   BREAST EXCISIONAL BIOPSY Left 1989   LUMBAR SPINE SURGERY  2000   TOTAL VAGINAL HYSTERECTOMY  1977   W/  ANTERIOR AND POSTERIOR REPAIR   Patient Active Problem List   Diagnosis Date Noted   Abnormal MRI of head 05/09/2021   Balance disorder 05/09/2021   Other headache syndrome 05/09/2021   Vision disturbance 05/09/2021   Cervical  myelopathy (HCC) 12/31/2020   Female cystocele 11/06/2020   History of total vaginal hysterectomy (TVH) 11/06/2020   PVC's (premature ventricular contractions) 03/17/2016   Atypical chest pain 02/16/2016   Palpitations 02/14/2016   Hyperlipidemia 02/14/2016   Osteopenia 09/15/2013   Vaginal atrophy 04/20/2012   Fecal incontinence 04/20/2012    PCP:  Aliene Beams, MD  REFERRING PROVIDER: Karle Barr, MD  REFERRING DIAG: R42 (ICD-10-CM) - Dizziness Diagnosis  R42 (ICD-10-CM) - Dizziness    THERAPY DIAG:  Unsteadiness on feet  Dizziness and giddiness  ONSET DATE: end of Nov. 2021:  Referral date  Rationale for Evaluation and Treatment: Rehabilitation  SUBJECTIVE:   SUBJECTIVE STATEMENT:    Pt reports the dizziness/unsteadiness seems to vary from day to day.  Pt states she feels more unsteady today for some reason - has been doing the exercises at home but has a question about the letter exercise.  Says she has not been to a busy store again since previous session 2 weeks ago  Pt accompanied by:  self  PERTINENT HISTORY: cervical myelopathy with surgery (ACDF)  - 12-31-20; HTN, intermittent palpitations, osteopenia, referred to Dr. Epimenio Foot by Dr. Everlena Cooper to rule out MS, h/o visual disturbance, other headache syndrome  PAIN:  Are you having pain?  Occasional fullness in forehead at times - not present at  this time  PRECAUTIONS: None  WEIGHT BEARING RESTRICTIONS: No  FALLS: Has patient fallen in last 6 months? No  LIVING ENVIRONMENT: Lives with: lives with their spouse Lives in: House/apartment Stairs: Yes: Internal: 3 steps; none Has following equipment at home: walking stick - carries it when she walks about a mile - 4-5x/week  PLOF: Independent and pt states she doesn't drive long distances  PATIENT GOALS: improve balance - improve sensitivity to bright lights if possible  OBJECTIVE:  Today's Treatment:  05-12-23   GAIT: Gait pattern: WFL Distance walked:  100' Assistive device utilized: None Level of assistance: Modified independence Comments: No LOB  NeuroRe-ed:  Pt performed x1 viewing exercise - in standing - target 5' away on plain background - pt performed horizontal and vertical head turns 60 secs each direction - performed this exercise without her glasses on   Standing Balance: - reviewed balance on foam exercises issued for HEP Surface: Airex Position: Narrow base of support only - pt able to perform with feet hip width apart without difficulty Completed with: Eyes Open and Eyes Closed; Head Turns x 5 Reps and Head Nods x 5 Reps    Pt performed marching on blue mat placed on ramp for standing on an incline and then for standing on a decline; pt performed marching with EO 5 reps focusing on target straight ahead; then with EC without head turns 5 reps each;  EO with horizontal head turns 5 reps, then with vertical head turns 5 reps with EO;  pt then performed same sequence with EC - no head turns, horizontal and vertical head turns 5 reps each with EC with marching with CGA to min assist for recovery of balance Pt performed amb. 20' forward - then turn and abrupt stop 4 reps - with turns to Rt side, 2 turns to Lt side  Pt performed amb. 30' making circles with ball clockwise and then counterclockwise - 2 reps each direction with CGA for improved gaze stabilization with ambulation  Pt performed habituation activity - reaching down to retrieve 6 cones (individually) on floor - pt stood on blue mat for compliant surface training; turned 180 degrees to place each cone on lower cabinet shelf to decrease dizziness with bending over, returning to standing and with turning; pt reported dizziness 2-3/10 intensity upon completion of this exercise                           Feet Apart (Compliant Surface) Head Motion - Eyes Closed    Stand on compliant surface: _pillow_______ with feet shoulder width apart. Close eyes  and move head slowly, up and down. Repeat __5__ times per session. Do _1-2___ sessions per day.  Copyright  VHI. All rights reserved.    Tip Card  1.The goal of habituation training is to assist in decreasing symptoms of vertigo, dizziness, or nausea provoked by specific head and body motions. 2.These exercises may initially increase symptoms; however, be persistent and work through symptoms. With repetition and time, the exercises will assist in reducing or eliminating symptoms. 3.Exercises should be stopped and discussed with the therapist if you experience any of the following: - Sudden change or fluctuation in hearing - New onset of ringing in the ears, or increase in current intensity - Any fluid discharge from the ear - Severe pain in neck or back - Extreme nausea   Gaze Stabilization: Tip Card  1.Target must remain in focus, not blurry, and appear  stationary while head is in motion. 2.Perform exercises with small head movements (45 to either side of midline). 3.Increase speed of head motion so long as target is in focus. 4.If you wear eyeglasses, be sure you can see target through lens (therapist will give specific instructions for bifocal / progressive lenses). 5.These exercises may provoke dizziness or nausea. Work through these symptoms. If too dizzy, slow head movement slightly. Rest between each exercise. 6.Exercises demand concentration; avoid distractions. 7.For safety, perform standing exercises close to a counter, wall, corner, or next to someone.     Gaze Stabilization: Standing Feet Apart    Feet shoulder width apart, keeping eyes on target on wall __5-6__ feet away, tilt head down 15-30 and move head side to side for _60___ seconds. Repeat while moving head up and down for _60___ seconds. Do __3-5__ sessions per day. Repeat using target on pattern background.  Copyright  VHI. All rights reserved.      PATIENT EDUCATION: Education details: Balance on foam  (sheet with 4 positions) EO and EC:  x1 viewing exercise; tip card on habituation with explanation to do activities or movements that provoke symptoms but not to overdo it and go above her threshold; pt was educated in results of SOT and given copy of the test for her records Person educated: Patient  Education method: Explanation, demonstration and handouts; copy of SOT Education comprehension: verbalized understanding, demonstrated understanding  HOME EXERCISE PROGRAM:  to be established  GOALS: Goals reviewed with patient? Yes  SHORT TERM GOALS: Target date: 05-15-23  Complete SOT and establish LTG. Baseline: Goal status: Goal met 04-28-23  2.  Independent in HEP for balance/vestibular exercises. Baseline:  Goal status: Goal met 05-12-23  3.  Pt will report improved ability with shopping/visual scanning in busy store.  Baseline:  Goal status: In progress - 05-12-23  4.  Pt will perform x1 viewing exercise for 60 secs horizontal and vertical with minimal c/o dizziness. Baseline: 30 secs each direction; 60 secs on 05-12-23 Goal status: Goal met 05-12-23   LONG TERM GOALS: Target date: 06-05-23  Improve all inputs on SOT (somatosensory, visual and vestibular) to WNL's. Baseline:  Goal status: INITIAL  2.  Increase FGA score by at least 4 points to increase safety with gait. Baseline:  Goal status: INITIAL  3.  Improve FOTO DPS score to >/=58/100 to demo improvement in dizziness and mobility.  Baseline: DPS 50;  DFS 49.3 Goal status: INITIAL  4.  Improve DHI by at least 10 points to demo improvement in dizziness and quality of life. Baseline:  Goal status: INITIAL  5.  Independent in updated HEP as appropriate.  Baseline:  Goal status: INITIAL  ASSESSMENT:  CLINICAL IMPRESSION: PT session focused on vestibular exercises for habituation and for improved gaze stabilization.  Pt had unsteadiness with amb. With tracking ball in both clockwise and counterclockwise directions  with CGA given initially for recovery of LOB.  Pt reported mild dizziness with bending over to pick up cone off floor and turning 180 degrees to place up onto cabinet shelf, rated 2-3/10 intensity.  Pt has met STG's #1,2 and 4 with STG #3 ongoing as pt reports she has not been to busy grocery store as of current time.  Pt is progressing well.  Cont with POC.  OBJECTIVE IMPAIRMENTS: decreased balance, dizziness, and vision and noise sensitivity .   ACTIVITY LIMITATIONS: bending, reach over head, and locomotion level  PARTICIPATION LIMITATIONS: meal prep, cleaning, driving, shopping, community activity, and occupation  PERSONAL FACTORS: Time since onset of injury/illness/exacerbation and 1 comorbidity: cervical myelopathy  are also affecting patient's functional outcome.   REHAB POTENTIAL: Good  CLINICAL DECISION MAKING: Evolving/moderate complexity  EVALUATION COMPLEXITY: Moderate   PLAN:  PT FREQUENCY: 2x/week x 1 week, then 1x/week x 5 weeks  PT DURATION: 6 weeks  PLANNED INTERVENTIONS: Therapeutic exercises, Therapeutic activity, Neuromuscular re-education, Balance training, Gait training, Patient/Family education, Self Care, Stair training, and Vestibular training  PLAN FOR NEXT SESSION:  check HEP for ?'s; do habituation ex - bending over; do DHI   Reatha Sur, Donavan Burnet, PT 05/13/2023, 3:12 PM

## 2023-05-19 ENCOUNTER — Ambulatory Visit: Payer: Medicare Other | Attending: Neurology | Admitting: Physical Therapy

## 2023-05-19 DIAGNOSIS — R42 Dizziness and giddiness: Secondary | ICD-10-CM | POA: Diagnosis present

## 2023-05-19 DIAGNOSIS — R2681 Unsteadiness on feet: Secondary | ICD-10-CM | POA: Diagnosis present

## 2023-05-19 NOTE — Patient Instructions (Signed)
1) add patterned background for letter exercise - 60 secs each direction  2)Marching on foam - eyes open 10 reps each leg      Eyes open - head turns side to side 10 times                           Up/down 10 times   3) Marching eyes closed - 10 reps - no head turns

## 2023-05-19 NOTE — Therapy (Signed)
OUTPATIENT PHYSICAL THERAPY VESTIBULAR TREATMENT NOTE     Patient Name: Monica Ochoa MRN: [id number] DOB:July 17, 1946, 77 y.o., female Today's Date: 05/20/2023  END OF SESSION:  PT End of Session - 05/20/23 1157     Visit Number 4    Number of Visits 7    Date for PT Re-Evaluation 06/05/23    Authorization Type [employer] Medicare    Authorization Time Period 04-27-23 - 06-27-23    PT Start Time 1312    PT Stop Time 1400    PT Time Calculation (min) 48 min    Activity Tolerance Patient tolerated treatment well    Behavior During Therapy [facility] for tasks assessed/performed               Past Medical History:  Diagnosis Date   Diabetes mellitus without complication (HCC)    Family history of adverse reaction to anesthesia    Sister has a hard time waking up after anesthesia and post op N/V   Female bladder prolapse    Hyperlipidemia 02/14/2016   Hypertension    Intermittent palpitations    03-23-2018 per pt caused due to anxiety/ upset   Osteopenia 06/2019   T score -2.1 FRAX 13% / 3.3% overall stable from prior DEXA   Palpitations    PVC's (premature ventricular contractions) 03/17/2016   Wears glasses    Past Surgical History:  Procedure Laterality Date   ABDOMINAL HYSTERECTOMY  1977   ANAL SPHINCTEROPLASTY  02/04/2011   WITH PERINEORRHAPHY   ANTERIOR CERVICAL DECOMP/DISCECTOMY FUSION N/A 12/31/2020   Procedure: Anterior Cervical Decompression Fusion  - Cervical five-Cervical six - Cervical six-Cervical seven;  Surgeon: Julio Sicks, MD;  Location: [facility] OR;  Service: Neurosurgery;  Laterality: N/A;   BREAST EXCISIONAL BIOPSY Left 1989   LUMBAR SPINE SURGERY  2000   TOTAL VAGINAL HYSTERECTOMY  1977   W/  ANTERIOR AND POSTERIOR REPAIR   Patient Active Problem List   Diagnosis Date Noted   Abnormal MRI of head 05/09/2021   Balance disorder 05/09/2021   Other headache syndrome 05/09/2021   Vision disturbance 05/09/2021   Cervical myelopathy (HCC) 12/31/2020   Female  cystocele 11/06/2020   History of total vaginal hysterectomy (TVH) 11/06/2020   PVC's (premature ventricular contractions) 03/17/2016   Atypical chest pain 02/16/2016   Palpitations 02/14/2016   Hyperlipidemia 02/14/2016   Osteopenia 09/15/2013   Vaginal atrophy 04/20/2012   Fecal incontinence 04/20/2012    PCP:  Aliene Beams, MD  REFERRING PROVIDER: Karle Barr, MD  REFERRING DIAG: R42 (ICD-10-CM) - Dizziness Diagnosis  R42 (ICD-10-CM) - Dizziness    THERAPY DIAG:  Unsteadiness on feet  Dizziness and giddiness  ONSET DATE: end of Nov. 2021:  Referral date  Rationale for Evaluation and Treatment: Rehabilitation  SUBJECTIVE:   SUBJECTIVE STATEMENT:    Pt reports she seems to be doing a little better - dizziness is slighty improved; pt states she unloaded bottom rack of dishwasher the other night and tried to not be so guarded when she did it - tried to look down and have more natural movements.  Pt has brought back DHI as requested by PT - calculated score at 46%; pt states it would probably would have been higher (worse) if she had completed it on day of initial eval  Pt accompanied by:  self  PERTINENT HISTORY: cervical myelopathy with surgery (ACDF)  - 12-31-20; HTN, intermittent palpitations, osteopenia, referred to Dr. Epimenio Foot by Dr. Everlena Cooper to rule out MS, h/o visual disturbance, other  headache syndrome  PAIN:  Are you having pain?  Occasional fullness in forehead at times - not present at this time  PRECAUTIONS: None  WEIGHT BEARING RESTRICTIONS: No  FALLS: Has patient fallen in last 6 months? No  LIVING ENVIRONMENT: Lives with: lives with their spouse Lives in: House/apartment Stairs: Yes: Internal: 3 steps; none Has following equipment at home: walking stick - carries it when she walks about a mile - 4-5x/week  PLOF: Independent and pt states she doesn't drive long distances  PATIENT GOALS: improve balance - improve sensitivity to bright lights if  possible  OBJECTIVE:  Today's Treatment:  05-19-23   GAIT: Gait pattern: WFL Distance walked: 100' Assistive device utilized: None Level of assistance: Modified independence Comments: No LOB  NeuroRe-ed:  Pt performed x1 viewing exercise - in standing - target 5' away on patterned background (used posture grid) - pt performed horizontal and vertical head turns 60 secs each direction - pt performed mild increased dizziness upon completion of exercise with both horizontal and with vertical head turns  Rockerboard - 10 reps with EO with minimal UE support on // bars; 10 reps x 2 sets with EC with min. UE support prn on // bars:  pt held board steady - performed horizontal head turns 5 reps with EO and then with EC 5 reps with CGA for balance; performed vertical head turns 5 reps with EO with SBA and then with EC 5 reps with CGA for balance recovery  Pt performed marching on blue mat placed on ramp for standing on an incline and then for standing on a decline; pt performed marching with EO 5 reps focusing on target straight ahead; then with EC without head turns 5 reps each;  EO with horizontal head turns 5 reps, then with vertical head turns 5 reps with EO;  pt then performed same sequence with EC - no head turns, horizontal and vertical head turns 5 reps each with EC with marching with CGA to min assist for recovery of balance  Pt performed amb. 20' forward - then turn and abrupt stop 2 reps with 1 turn to right side and with 1 turn toward left side - SBA but pt had no LOB  Pt performed amb. 30' making circles with ball clockwise and then counterclockwise - 2 reps each direction with CGA for improved gaze stabilization with ambulation  Pt performed habituation activity - standing on blue mat on floor - wide BOS- pt reached down to floor to retrieve 5# kettlebell - picked it up and turned to right side for diagonal turn with bending over - performed 5 reps reaching down to left side - lifting up  toward Rt side and then other diagonal - reaching down to Rt foot and lifting up toward Lt side 5 reps; pt performed min. Dizziness with this activity  Pt stood on blue mat - performed 180 degree turns 2 reps toward Lt side, 2 reps toward Rt side for habituation with turning with CGA to SBA Performed 360 degree turns 1 rep to Rt side, 1 rep to Lt side with CGA  Explained DHI score of 46% to patient - moderate handicap category       UPDATED HEP on 05-20-23: 1) add patterned background for letter exercise - 60 secs each direction - same reps as previously instructed - 3-5 times/day 1x each direction (horizontal and vertical)  2)Marching on foam - eyes open 10 reps each leg - 1-2x/day for following exs.:  Eyes open - head turns side to side 10 times                           Up/down 10 times   3) Marching eyes closed - 10 reps - no head turns - 1-2x/day   Emphasized need to go to busy store for visual scanning and gaze stabilization exercise                Feet Apart (Compliant Surface) Head Motion - Eyes Closed    Stand on compliant surface: _pillow_______ with feet shoulder width apart. Close eyes and move head slowly, up and down. Repeat __5__ times per session. Do _1-2___ sessions per day.  Copyright  VHI. All rights reserved.    Tip Card  1.The goal of habituation training is to assist in decreasing symptoms of vertigo, dizziness, or nausea provoked by specific head and body motions. 2.These exercises may initially increase symptoms; however, be persistent and work through symptoms. With repetition and time, the exercises will assist in reducing or eliminating symptoms. 3.Exercises should be stopped and discussed with the therapist if you experience any of the following: - Sudden change or fluctuation in hearing - New onset of ringing in the ears, or increase in current intensity - Any fluid discharge from the ear - Severe pain in neck or back - Extreme  nausea   Gaze Stabilization: Tip Card  1.Target must remain in focus, not blurry, and appear stationary while head is in motion. 2.Perform exercises with small head movements (45 to either side of midline). 3.Increase speed of head motion so long as target is in focus. 4.If you wear eyeglasses, be sure you can see target through lens (therapist will give specific instructions for bifocal / progressive lenses). 5.These exercises may provoke dizziness or nausea. Work through these symptoms. If too dizzy, slow head movement slightly. Rest between each exercise. 6.Exercises demand concentration; avoid distractions. 7.For safety, perform standing exercises close to a counter, wall, corner, or next to someone.     Gaze Stabilization: Standing Feet Apart    Feet shoulder width apart, keeping eyes on target on wall __5-6__ feet away, tilt head down 15-30 and move head side to side for _60___ seconds. Repeat while moving head up and down for _60___ seconds. Do __3-5__ sessions per day. Repeat using target on pattern background.  Copyright  VHI. All rights reserved.      PATIENT EDUCATION: Education details: Balance on foam (sheet with 4 positions) EO and EC:  x1 viewing exercise; tip card on habituation with explanation to do activities or movements that provoke symptoms but not to overdo it and go above her threshold; pt was educated in results of SOT and given copy of the test for her records Person educated: Patient  Education method: Explanation, demonstration and handouts; copy of SOT Education comprehension: verbalized understanding, demonstrated understanding  HOME EXERCISE PROGRAM:  to be established  GOALS: Goals reviewed with patient? Yes  SHORT TERM GOALS: Target date: 05-15-23  Complete SOT and establish LTG. Baseline: Goal status: Goal met 04-28-23  2.  Independent in HEP for balance/vestibular exercises. Baseline:  Goal status: Goal met 05-12-23  3.  Pt will  report improved ability with shopping/visual scanning in busy store.  Baseline:  Goal status: In progress - 05-12-23  4.  Pt will perform x1 viewing exercise for 60 secs horizontal and vertical with minimal c/o dizziness. Baseline: 30 secs each direction; 60 secs  on 05-12-23 Goal status: Goal met 05-12-23   LONG TERM GOALS: Target date: 06-05-23  Improve all inputs on SOT (somatosensory, visual and vestibular) to WNL's. Baseline:  Goal status: INITIAL  2.  Increase FGA score by at least 4 points to increase safety with gait. Baseline:  Goal status: INITIAL  3.  Improve FOTO DPS score to >/=58/100 to demo improvement in dizziness and mobility.  Baseline: DPS 50;  DFS 49.3 Goal status: INITIAL  4.  Improve DHI by at least 10 points to demo improvement in dizziness and quality of life. Baseline: 46% - calculated on 05-19-23 Goal status: INITIAL  5.  Independent in updated HEP as appropriate.  Baseline:  Goal status: INITIAL  ASSESSMENT:  CLINICAL IMPRESSION: PT session focused on vestibular exercises for habituation and for improved gaze stabilization with x1 viewing exercise progressed from plain to patterned background.  Pt able to perform exercise with horizontal and vertical head turns for 60 secs each with min. Increase in dizziness reported with each direction.  Pt able to bend down to floor and pick up 5# kettlebell with trunk rotation to both sides with no major increase in dizziness.  Pt's DHI score 46% - moderate disability category.  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  Mobility is WNL's.  Cont with POC.  OBJECTIVE IMPAIRMENTS: decreased balance, dizziness, and vision and noise sensitivity .   ACTIVITY LIMITATIONS: bending, reach over head, and locomotion level  PARTICIPATION LIMITATIONS: meal prep, cleaning,  driving, shopping, community activity, and occupation  PERSONAL FACTORS: Time since onset of injury/illness/exacerbation and 1 comorbidity: cervical myelopathy  are also affecting patient's functional outcome.   REHAB POTENTIAL: Good  CLINICAL DECISION MAKING: Evolving/moderate complexity  EVALUATION COMPLEXITY: Moderate   PLAN:  PT FREQUENCY: 2x/week x 1 week, then 1x/week x 5 weeks  PT DURATION: 6 weeks  PLANNED INTERVENTIONS: Therapeutic exercises, Therapeutic activity, Neuromuscular re-education, Balance training, Gait training, Patient/Family education, Self Care, Stair training, and Vestibular training  PLAN FOR NEXT SESSION:  do habituation ex - bending over; cont with balance/vestibular exs. - Dentist with moving surround?   Kary Kos, PT 05/20/2023, 11:59 AM

## 2023-05-20 ENCOUNTER — Encounter: Payer: Self-pay | Admitting: Physical Therapy

## 2023-05-26 ENCOUNTER — Ambulatory Visit: Payer: Medicare Other | Admitting: Physical Therapy

## 2023-05-28 ENCOUNTER — Ambulatory Visit: Payer: Medicare Other | Admitting: Physical Therapy

## 2023-05-28 VITALS — BP 140/60 | HR 72

## 2023-05-28 DIAGNOSIS — R42 Dizziness and giddiness: Secondary | ICD-10-CM

## 2023-05-29 ENCOUNTER — Encounter: Payer: Self-pay | Admitting: Physical Therapy

## 2023-05-29 NOTE — Therapy (Signed)
[facility] [address] [place], [place], [address] Phone: [phone]   Fax:  [phone]  Physical Therapy Treatment- ARRIVED NO CHARGE  Patient Details  Name: Monica Ochoa MRN: [id number] Date of Birth: 02/16/1946 No data recorded  Encounter Date: 05/28/2023   PT End of Session - 05/29/23 1420     Visit Number 4    Number of Visits 7    Date for PT Re-Evaluation 06/05/23    Authorization Type [employer]    Authorization Time Period 04-27-23 - 06-27-23    PT Start Time 1320    PT Stop Time 1340    PT Time Calculation (min) 20 min    Activity Tolerance Other (comment)   limited by elevated BP at start of session            Past Medical History:  Diagnosis Date   Diabetes mellitus without complication (HCC)    Family history of adverse reaction to anesthesia    Sister has a hard time waking up after anesthesia and post op N/V   Female bladder prolapse    Hyperlipidemia 02/14/2016   Hypertension    Intermittent palpitations    03-23-2018 per pt caused due to anxiety/ upset   Osteopenia 06/2019   T score -2.1 FRAX 13% / 3.3% overall stable from prior DEXA   Palpitations    PVC's (premature ventricular contractions) 03/17/2016   Wears glasses     Past Surgical History:  Procedure Laterality Date   ABDOMINAL HYSTERECTOMY  1977   ANAL SPHINCTEROPLASTY  02/04/2011   WITH PERINEORRHAPHY   ANTERIOR CERVICAL DECOMP/DISCECTOMY FUSION N/A 12/31/2020   Procedure: Anterior Cervical Decompression Fusion  - Cervical five-Cervical six - Cervical six-Cervical seven;  Surgeon: Julio Sicks, MD;  Location: [facility] OR;  Service: Neurosurgery;  Laterality: N/A;   BREAST EXCISIONAL BIOPSY Left 1989   LUMBAR SPINE SURGERY  2000   TOTAL VAGINAL HYSTERECTOMY  1977   W/  ANTERIOR AND POSTERIOR REPAIR    Vitals:   05/28/23 1326 05/28/23 1335  BP: (!) 163/66 (!) 140/60  Pulse: 80 72       Pt was scheduled for PT treatment for  assessment of possible BPPV due to new onset of vertigo which started last Thursday.  Pt reported she took her BP just before leaving home and it was high - BP recorded at start of session with readings recorded as above.  Treatment deferred due to elevated BP - pt agreed, even though BP had decreased 10" after initial reading.    Session rescheduled for next Tuesday, July 16.                                        Patient will benefit from skilled therapeutic intervention in order to improve the following deficits and impairments:     Visit Diagnosis: Dizziness and giddiness     Problem List Patient Active Problem List   Diagnosis Date Noted   Abnormal MRI of head 05/09/2021   Balance disorder 05/09/2021   Other headache syndrome 05/09/2021   Vision disturbance 05/09/2021   Cervical myelopathy (HCC) 12/31/2020   Female cystocele 11/06/2020   History of total vaginal hysterectomy (TVH) 11/06/2020   PVC's (premature ventricular contractions) 03/17/2016   Atypical chest pain 02/16/2016   Palpitations 02/14/2016   Hyperlipidemia 02/14/2016   Osteopenia 09/15/2013   Vaginal atrophy 04/20/2012  Fecal incontinence 04/20/2012    DildayDonavan Burnet, PT 05/29/2023, 2:26 PM  [place] [facility] [address] [place], [place], [address] Phone: [phone]   Fax:  [phone]  Name: SYMIAH SOCARRAS MRN: [id number] Date of Birth: May 27, 1946

## 2023-06-02 ENCOUNTER — Encounter: Payer: Medicare Other | Admitting: Physical Therapy

## 2023-06-16 ENCOUNTER — Ambulatory Visit: Payer: Medicare Other | Admitting: Physical Therapy

## 2023-06-16 ENCOUNTER — Encounter: Payer: Self-pay | Admitting: Physical Therapy

## 2023-09-18 ENCOUNTER — Other Ambulatory Visit: Payer: Self-pay | Admitting: Family Medicine

## 2023-09-18 DIAGNOSIS — Z1231 Encounter for screening mammogram for malignant neoplasm of breast: Secondary | ICD-10-CM

## 2023-09-18 DIAGNOSIS — M8588 Other specified disorders of bone density and structure, other site: Secondary | ICD-10-CM

## 2024-05-02 ENCOUNTER — Other Ambulatory Visit: Payer: Medicare Other

## 2024-05-02 ENCOUNTER — Ambulatory Visit
Admission: RE | Admit: 2024-05-02 | Discharge: 2024-05-02 | Disposition: A | Discharge status: Home / Self Care | Payer: Medicare Other | Source: Ambulatory Visit | Attending: Family Medicine

## 2024-05-02 DIAGNOSIS — Z1231 Encounter for screening mammogram for malignant neoplasm of breast: Secondary | ICD-10-CM

## 2025-02-07 ENCOUNTER — Ambulatory Visit
